# Patient Record
Sex: Female | Born: 1955
Health system: Southern US, Community
[De-identification: ages and names within clinical notes are randomized; demographics above are authoritative.]

## PROBLEM LIST (undated history)

## (undated) DIAGNOSIS — N183 Chronic kidney disease, stage 3 unspecified: Secondary | ICD-10-CM

## (undated) DIAGNOSIS — E1129 Type 2 diabetes mellitus with other diabetic kidney complication: Secondary | ICD-10-CM

## (undated) DIAGNOSIS — Z9889 Other specified postprocedural states: Secondary | ICD-10-CM

## (undated) DIAGNOSIS — I10 Essential (primary) hypertension: Secondary | ICD-10-CM

## (undated) DIAGNOSIS — Z940 Kidney transplant status: Secondary | ICD-10-CM

## (undated) DIAGNOSIS — E785 Hyperlipidemia, unspecified: Secondary | ICD-10-CM

## (undated) DIAGNOSIS — T8859XA Other complications of anesthesia, initial encounter: Secondary | ICD-10-CM

## (undated) DIAGNOSIS — R112 Nausea with vomiting, unspecified: Secondary | ICD-10-CM

## (undated) DIAGNOSIS — T4145XA Adverse effect of unspecified anesthetic, initial encounter: Secondary | ICD-10-CM

## (undated) HISTORY — PX: NEPHRECTOMY TRANSPLANTED ORGAN: SUR880

---

## 2015-10-07 DIAGNOSIS — Z940 Kidney transplant status: Secondary | ICD-10-CM

## 2015-10-07 HISTORY — DX: Kidney transplant status: Z94.0

## 2016-01-22 MED FILL — UNI-SOLVE ADHES REMOVER WIP: 90 days supply | Qty: 50 | Fill #0

## 2016-01-31 MED FILL — TACROLIMUS 1 MG CAPSULE: 1 | 30 days supply | Qty: 450 | Fill #0

## 2016-04-21 MED FILL — IV PREP ANTISEPTIC WIPES: 70 | 30 days supply | Qty: 50 | Fill #1

## 2016-09-24 MED FILL — UNI-SOLVE ADHES REMOVER WIP: 90 days supply | Qty: 50 | Fill #1

## 2017-10-03 ENCOUNTER — Emergency Department (HOSPITAL_COMMUNITY): Payer: Medicare Other

## 2017-10-03 ENCOUNTER — Inpatient Hospital Stay (HOSPITAL_COMMUNITY)
Admission: EM | Admit: 2017-10-03 | Discharge: 2017-10-05 | DRG: 637 | Disposition: A | Discharge status: Home / Self Care | Payer: Medicare Other | Attending: Internal Medicine | Admitting: Internal Medicine

## 2017-10-03 ENCOUNTER — Encounter (HOSPITAL_COMMUNITY): Payer: Self-pay | Admitting: Emergency Medicine

## 2017-10-03 DIAGNOSIS — W19XXXA Unspecified fall, initial encounter: Secondary | ICD-10-CM | POA: Diagnosis present

## 2017-10-03 DIAGNOSIS — I609 Nontraumatic subarachnoid hemorrhage, unspecified: Secondary | ICD-10-CM

## 2017-10-03 DIAGNOSIS — E1022 Type 1 diabetes mellitus with diabetic chronic kidney disease: Secondary | ICD-10-CM | POA: Diagnosis present

## 2017-10-03 DIAGNOSIS — E1129 Type 2 diabetes mellitus with other diabetic kidney complication: Secondary | ICD-10-CM

## 2017-10-03 DIAGNOSIS — R55 Syncope and collapse: Secondary | ICD-10-CM | POA: Diagnosis present

## 2017-10-03 DIAGNOSIS — N183 Chronic kidney disease, stage 3 unspecified: Secondary | ICD-10-CM | POA: Diagnosis present

## 2017-10-03 DIAGNOSIS — W1830XA Fall on same level, unspecified, initial encounter: Secondary | ICD-10-CM | POA: Diagnosis present

## 2017-10-03 DIAGNOSIS — S02401A Maxillary fracture, unspecified, initial encounter for closed fracture: Secondary | ICD-10-CM | POA: Diagnosis present

## 2017-10-03 DIAGNOSIS — Z9641 Presence of insulin pump (external) (internal): Secondary | ICD-10-CM | POA: Diagnosis present

## 2017-10-03 DIAGNOSIS — Z940 Kidney transplant status: Secondary | ICD-10-CM

## 2017-10-03 DIAGNOSIS — E162 Hypoglycemia, unspecified: Secondary | ICD-10-CM | POA: Diagnosis present

## 2017-10-03 DIAGNOSIS — E109 Type 1 diabetes mellitus without complications: Secondary | ICD-10-CM | POA: Diagnosis present

## 2017-10-03 DIAGNOSIS — I10 Essential (primary) hypertension: Secondary | ICD-10-CM | POA: Diagnosis present

## 2017-10-03 DIAGNOSIS — S0240DA Maxillary fracture, left side, initial encounter for closed fracture: Secondary | ICD-10-CM | POA: Diagnosis present

## 2017-10-03 DIAGNOSIS — I129 Hypertensive chronic kidney disease with stage 1 through stage 4 chronic kidney disease, or unspecified chronic kidney disease: Secondary | ICD-10-CM | POA: Diagnosis present

## 2017-10-03 DIAGNOSIS — E785 Hyperlipidemia, unspecified: Secondary | ICD-10-CM | POA: Diagnosis present

## 2017-10-03 DIAGNOSIS — Z794 Long term (current) use of insulin: Secondary | ICD-10-CM

## 2017-10-03 DIAGNOSIS — E10649 Type 1 diabetes mellitus with hypoglycemia without coma: Principal | ICD-10-CM | POA: Diagnosis present

## 2017-10-03 DIAGNOSIS — S0232XA Fracture of orbital floor, left side, initial encounter for closed fracture: Secondary | ICD-10-CM | POA: Diagnosis present

## 2017-10-03 DIAGNOSIS — S066X9A Traumatic subarachnoid hemorrhage with loss of consciousness of unspecified duration, initial encounter: Secondary | ICD-10-CM | POA: Diagnosis present

## 2017-10-03 HISTORY — DX: Chronic kidney disease, stage 3 unspecified: N18.30

## 2017-10-03 HISTORY — DX: Essential (primary) hypertension: I10

## 2017-10-03 HISTORY — DX: Type 2 diabetes mellitus with other diabetic kidney complication: E11.29

## 2017-10-03 HISTORY — DX: Hyperlipidemia, unspecified: E78.5

## 2017-10-03 HISTORY — DX: Chronic kidney disease, stage 3 (moderate): N18.3

## 2017-10-03 HISTORY — DX: Kidney transplant status: Z94.0

## 2017-10-03 LAB — CBC WITH DIFFERENTIAL/PLATELET
BASOS PCT: 0 %
Basophils Absolute: 0 10*3/uL (ref 0.0–0.1)
Eosinophils Absolute: 0 10*3/uL (ref 0.0–0.7)
Eosinophils Relative: 0 %
HEMATOCRIT: 43.3 % (ref 36.0–46.0)
HEMOGLOBIN: 14 g/dL (ref 12.0–15.0)
LYMPHS ABS: 1.1 10*3/uL (ref 0.7–4.0)
Lymphocytes Relative: 15 %
MCH: 31 pg (ref 26.0–34.0)
MCHC: 32.3 g/dL (ref 30.0–36.0)
MCV: 96 fL (ref 78.0–100.0)
MONOS PCT: 6 %
Monocytes Absolute: 0.4 10*3/uL (ref 0.1–1.0)
NEUTROS ABS: 5.8 10*3/uL (ref 1.7–7.7)
NEUTROS PCT: 79 %
Platelets: 175 10*3/uL (ref 150–400)
RBC: 4.51 MIL/uL (ref 3.87–5.11)
RDW: 14.6 % (ref 11.5–15.5)
WBC: 7.3 10*3/uL (ref 4.0–10.5)

## 2017-10-03 LAB — HEPATIC FUNCTION PANEL
ALT: 20 U/L (ref 14–54)
AST: 33 U/L (ref 15–41)
Albumin: 3.9 g/dL (ref 3.5–5.0)
Alkaline Phosphatase: 62 U/L (ref 38–126)
BILIRUBIN DIRECT: 0.2 mg/dL (ref 0.1–0.5)
BILIRUBIN INDIRECT: 0.5 mg/dL (ref 0.3–0.9)
TOTAL PROTEIN: 7.2 g/dL (ref 6.5–8.1)
Total Bilirubin: 0.7 mg/dL (ref 0.3–1.2)

## 2017-10-03 LAB — BASIC METABOLIC PANEL
ANION GAP: 10 (ref 5–15)
BUN: 45 mg/dL — ABNORMAL HIGH (ref 6–20)
CO2: 23 mmol/L (ref 22–32)
Calcium: 8.8 mg/dL — ABNORMAL LOW (ref 8.9–10.3)
Chloride: 106 mmol/L (ref 101–111)
Creatinine, Ser: 1.7 mg/dL — ABNORMAL HIGH (ref 0.44–1.00)
GFR calc Af Amer: 36 mL/min — ABNORMAL LOW (ref >60)
GFR calc non Af Amer: 31 mL/min — ABNORMAL LOW (ref >60)
GLUCOSE: 102 mg/dL — AB (ref 65–99)
POTASSIUM: 3.5 mmol/L (ref 3.5–5.1)
Sodium: 139 mmol/L (ref 135–145)

## 2017-10-03 LAB — ETHANOL: Alcohol, Ethyl (B): <10 mg/dL (ref <10)

## 2017-10-03 LAB — CBG MONITORING, ED
Glucose-Capillary: 97 mg/dL (ref 65–99)
Glucose-Capillary: 81 mg/dL (ref 65–99)

## 2017-10-03 LAB — I-STAT TROPONIN, ED: Troponin i, poc: 0 ng/mL (ref 0.00–0.08)

## 2017-10-03 LAB — PROTIME-INR
INR: 1.02
PROTHROMBIN TIME: 13.3 s (ref 11.4–15.2)

## 2017-10-03 MED ORDER — SODIUM CHLORIDE 0.9 % IV SOLN
500.0000 mg | Freq: Two times a day (BID) | INTRAVENOUS | Status: DC
  Administered 2017-10-03 – 2017-10-04 (×3): 500 mg via INTRAVENOUS
  Filled 2017-10-03 (×5): qty 5

## 2017-10-03 NOTE — Consult Note (Signed)
Chief Complaint   Chief Complaint  Patient presents with  . Fall    HPI   HPI: Hannah Valentine is a 61 y.o. female who presented to ER after witnessed fall. She was out feeding deer when she became hypoglycemic and fell striking left side of face. +LOC. No witnessed seizure like activity. GBC 37 by report upon EMS arrival.. She reports she is "immune" to symptoms of hypoglycemia and will occasionally become hypoglycemic without warning. She currently feels well with left sided facial pain. Mild is severity. Denies nausea, vomiting, dizziness, photophobia, motor/sensory deficits. 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 No history CVA/MI.  There are no active problems to display for this patient.   PMH: Past Medical History:  Diagnosis Date  . Kidney transplanted 2017    PSH: Past Surgical History:  Procedure Laterality Date  . NEPHRECTOMY TRANSPLANTED ORGAN       (Not in a hospital admission)  SH: Social History   Tobacco Use  . Smoking status: Never Smoker  . Smokeless tobacco: Never Used  Substance Use Topics  . Alcohol use: No    Frequency: Never  . Drug use: No    MEDS: Prior to Admission medications   Not on File    ALLERGY: Not on File  Social History   Tobacco Use  . Smoking status: Never Smoker  . Smokeless tobacco: Never Used  Substance Use Topics  . Alcohol use: No    Frequency: Never     No family history on file.   ROS   Review of Systems  Constitutional: Negative.   HENT: Negative.   Eyes: Negative.   Respiratory: Negative.   Cardiovascular: Negative.   Gastrointestinal: Negative.   Genitourinary: Negative.   Musculoskeletal: Positive for falls. Negative for back pain, joint pain, myalgias and neck pain.  Skin: Negative.   Neurological: Positive for loss of consciousness and headaches (left sided facial pain). Negative for dizziness, tingling, tremors, sensory change, speech change, focal weakness  and seizures.    Exam   Vitals:   10/03/17 2100 10/03/17 2130  BP: (!) 163/53 (!) 163/60  Pulse: (!) 56 (!) 59  Resp: (!) 22 13  SpO2: 98% 98%   General appearance: WDWN, resting comfortably, NAD, bruising and swelling left orbit Eyes: PERRL, Fundoscopic: normal Cardiovascular: Regular rate and rhythm without murmurs, rubs, gallops. No edema or variciosities. Distal pulses normal. Pulmonary: Clear to auscultation Musculoskeletal:     Muscle tone upper extremities: Normal    Muscle tone lower extremities: Normal    Motor exam: Upper Extremities Deltoid Bicep Tricep Grip  Right 5/5 5/5 4-/5 (chronic due to fistula per patient) 5/5  Left 5/5 5/5 5/5 5/5   Lower Extremity IP Quad PF DF EHL  Right 5/5 5/5 5/5 5/5 5/5  Left 5/5 5/5 5/5 5/5 5/5   Neurological Awake, alert, oriented Memory and concentration grossly intact Speech fluent, appropriate CNII: Visual fields normal CNIII/IV/VI: EOMI CNV: Facial sensation normal CNVII: Symmetric, normal strength CNVIII: Grossly normal CNIX: Normal palate movement CNXI: Trap and SCM strength normal CN XII: Tongue protrusion normal Sensation grossly intact to LT DTR: Normal Coordination (finger/nose & heel/shin): Normal  Results - Imaging/Labs   Results for orders placed or performed during the hospital encounter of 10/03/17 (from the past 48 hour(s))  Basic metabolic panel     Status: Abnormal   Collection Time: 10/03/17  7:50 PM  Result Value Ref Range   Sodium 139 135 - 145 mmol/L  Potassium 3.5 3.5 - 5.1 mmol/L   Chloride 106 101 - 111 mmol/L   CO2 23 22 - 32 mmol/L   Glucose, Bld 102 (H) 65 - 99 mg/dL   BUN 45 (H) 6 - 20 mg/dL   Creatinine, Ser 1.70 (H) 0.44 - 1.00 mg/dL   Calcium 8.8 (L) 8.9 - 10.3 mg/dL   GFR calc non Af Amer 31 (L) >60 mL/min   GFR calc Af Amer 36 (L) >60 mL/min    Comment: (NOTE) The eGFR has been calculated using the CKD EPI equation. This calculation has not been validated in all clinical  situations. eGFR's persistently <60 mL/min signify possible Chronic Kidney Disease.    Anion gap 10 5 - 15  Ethanol     Status: None   Collection Time: 10/03/17  7:50 PM  Result Value Ref Range   Alcohol, Ethyl (B) <10 <10 mg/dL    Comment:        LOWEST DETECTABLE LIMIT FOR SERUM ALCOHOL IS 10 mg/dL FOR MEDICAL PURPOSES ONLY   CBC with Differential     Status: None   Collection Time: 10/03/17  7:50 PM  Result Value Ref Range   WBC 7.3 4.0 - 10.5 K/uL   RBC 4.51 3.87 - 5.11 MIL/uL   Hemoglobin 14.0 12.0 - 15.0 g/dL   HCT 43.3 36.0 - 46.0 %   MCV 96.0 78.0 - 100.0 fL   MCH 31.0 26.0 - 34.0 pg   MCHC 32.3 30.0 - 36.0 g/dL   RDW 14.6 11.5 - 15.5 %   Platelets 175 150 - 400 K/uL   Neutrophils Relative % 79 %   Neutro Abs 5.8 1.7 - 7.7 K/uL   Lymphocytes Relative 15 %   Lymphs Abs 1.1 0.7 - 4.0 K/uL   Monocytes Relative 6 %   Monocytes Absolute 0.4 0.1 - 1.0 K/uL   Eosinophils Relative 0 %   Eosinophils Absolute 0.0 0.0 - 0.7 K/uL   Basophils Relative 0 %   Basophils Absolute 0.0 0.0 - 0.1 K/uL  Protime-INR     Status: None   Collection Time: 10/03/17  7:50 PM  Result Value Ref Range   Prothrombin Time 13.3 11.4 - 15.2 seconds   INR 1.02   Hepatic function panel     Status: None   Collection Time: 10/03/17  7:50 PM  Result Value Ref Range   Total Protein 7.2 6.5 - 8.1 g/dL   Albumin 3.9 3.5 - 5.0 g/dL   AST 33 15 - 41 U/L   ALT 20 14 - 54 U/L   Alkaline Phosphatase 62 38 - 126 U/L   Total Bilirubin 0.7 0.3 - 1.2 mg/dL   Bilirubin, Direct 0.2 0.1 - 0.5 mg/dL   Indirect Bilirubin 0.5 0.3 - 0.9 mg/dL  I-stat troponin, ED     Status: None   Collection Time: 10/03/17  7:52 PM  Result Value Ref Range   Troponin i, poc 0.00 0.00 - 0.08 ng/mL   Comment 3            Comment: Due to the release kinetics of cTnI, a negative result within the first hours of the onset of symptoms does not rule out myocardial infarction with certainty. If myocardial infarction is still  suspected, repeat the test at appropriate intervals.   POC CBG, ED     Status: None   Collection Time: 10/03/17  7:58 PM  Result Value Ref Range   Glucose-Capillary 97 65 - 99 mg/dL  Dg Chest 2 View  Result Date: 10/03/2017 CLINICAL DATA:  Recent syncopal episode EXAM: CHEST  2 VIEW COMPARISON:  03/14/2015 FINDINGS: Cardiac shadow is enlarged. Aortic calcifications are again seen. The lungs are well aerated bilaterally without focal infiltrate or sizable effusion. No acute bony abnormality is seen. IMPRESSION: No active cardiopulmonary disease. Electronically Signed   By: Inez Catalina M.D.   On: 10/03/2017 21:20   Ct Head Wo Contrast  Result Date: 10/03/2017 CLINICAL DATA:  Facial trauma EXAM: CT HEAD WITHOUT CONTRAST CT MAXILLOFACIAL WITHOUT CONTRAST TECHNIQUE: Multidetector CT imaging of the head and maxillofacial structures were performed using the standard protocol without intravenous contrast. Multiplanar CT image reconstructions of the maxillofacial structures were also generated. COMPARISON:  None. FINDINGS: CT HEAD FINDINGS Brain: Ventricle size normal.  Negative for acute infarct or mass Small amount of subarachnoid hemorrhage left parietal region. No subdural hematoma or midline shift. Vascular: Negative for hyperdense vessel Skull: Left facial fractures described below. Negative for skull fracture. Other: None CT MAXILLOFACIAL FINDINGS Osseous: Fracture of the left maxillary sinus involving the anterior and lateral walls with mild displacement. There is blood in the left maxillary sinus. Mildly displaced fracture of the left orbital floor. Zygomatic arch intact. No other facial fractures. Moderate to advanced degenerative change right TMJ. Orbits: Mildly displaced fracture left orbital floor with bone fragment projecting into the left orbit. Sinuses: Blood in the left maxillary sinus.  Remaining sinuses clear Soft tissues: Soft tissue hematoma lateral to the left orbit IMPRESSION: 1.  Small volume subarachnoid hemorrhage left parietal region 2. Fracture of the left maxillary sinus and left orbital floor. Small bone fragment projects into the left orbit. These results were called by telephone at the time of interpretation on 10/03/2017 at 8:58 pm to Dr. Alferd Apa , who verbally acknowledged these results. Electronically Signed   By: Franchot Gallo M.D.   On: 10/03/2017 20:58   Ct Maxillofacial Wo Cm  Result Date: 10/03/2017 CLINICAL DATA:  Facial trauma EXAM: CT HEAD WITHOUT CONTRAST CT MAXILLOFACIAL WITHOUT CONTRAST TECHNIQUE: Multidetector CT imaging of the head and maxillofacial structures were performed using the standard protocol without intravenous contrast. Multiplanar CT image reconstructions of the maxillofacial structures were also generated. COMPARISON:  None. FINDINGS: CT HEAD FINDINGS Brain: Ventricle size normal.  Negative for acute infarct or mass Small amount of subarachnoid hemorrhage left parietal region. No subdural hematoma or midline shift. Vascular: Negative for hyperdense vessel Skull: Left facial fractures described below. Negative for skull fracture. Other: None CT MAXILLOFACIAL FINDINGS Osseous: Fracture of the left maxillary sinus involving the anterior and lateral walls with mild displacement. There is blood in the left maxillary sinus. Mildly displaced fracture of the left orbital floor. Zygomatic arch intact. No other facial fractures. Moderate to advanced degenerative change right TMJ. Orbits: Mildly displaced fracture left orbital floor with bone fragment projecting into the left orbit. Sinuses: Blood in the left maxillary sinus.  Remaining sinuses clear Soft tissues: Soft tissue hematoma lateral to the left orbit IMPRESSION: 1. Small volume subarachnoid hemorrhage left parietal region 2. Fracture of the left maxillary sinus and left orbital floor. Small bone fragment projects into the left orbit. These results were called by telephone at the time of  interpretation on 10/03/2017 at 8:58 pm to Dr. Alferd Apa , who verbally acknowledged these results. Electronically Signed   By: Franchot Gallo M.D.   On: 10/03/2017 20:58    Impression/Plan   61 y.o. female with small amount of SAH left partal region.  No mass effect/midline shift. No evidence of hydrocephalus. She is neurologically intact. No acute neurosurgical intervention. Should resolve with time. Patient has other injuries including left orbital floor fracture and left maxillary sinus fracture - defer to specialists respectively.  Patient to be admitted under trauma.   - Admit for obs of [place]   - Monitor neuro q 1-2 hours, report changes   - Repeat haed CT tomorrow am, sooner as indicated by exam   - Keppra 587m BID x7 days for seizure prophylaxis  Will follow from [facility]  Please call for any questions.

## 2017-10-03 NOTE — ED Notes (Signed)
Re-paged Trauma

## 2017-10-03 NOTE — ED Triage Notes (Signed)
Per EMS. Pt fell today due to low glucose. Pt CBG on scene was 37. Given 1 of glucagon and 5 g of dextrose. CBG is now 124. Pt fell face first and hit her head on concrete. Pt denies LOC but was confused after fall. Pt denies neck or back pain.

## 2017-10-03 NOTE — H&P (Signed)
Activation and Reason: Trauma consult - Ferne Reus  Primary Survey:  Airway: Intact Breathing: Spontaneous Circulation: Palpable pulses in all 4 ext Disability: GCS 15  Hannah Valentine is an 61 y.o. female.  HPI: 18F presented to ED after witnessed fall around 5:30pm tonight. She was out feeding deer when she became hypoglycemic and fell striking left side of face. +LOC. No witnessed seizure like activity. GBC 37 by report upon EMS arrival.. She reports she is "immune" to symptoms of hypoglycemia and will occasionally become hypoglycemic without warning. She currently feels well with left sided facial pain. Mild is severity. Denies nausea, vomiting, dizziness, photophobia, motor/sensory deficits. Does have chronic LUE weakness secondary to AVF for dialysis which she no longer needs 2/2 new kidney transplant 2 yrs ago.Takes daily 69m ASA. No history CVA/MI.  Past Medical History:  Diagnosis Date  . Kidney transplanted 2017    Past Surgical History:  Procedure Laterality Date  . NEPHRECTOMY TRANSPLANTED ORGAN      FHx: Denies FHx of malignancy  Social History:  reports that  has never smoked. she has never used smokeless tobacco. She reports that she does not drink alcohol or use drugs.  Allergies:  Allergies  Allergen Reactions  . Atorvastatin Other (See Comments)    Muscle weakness  . Hydrocodone-Acetaminophen Nausea Only  . Labetalol Other (See Comments)    "makes her feel bad"  . Meperidine Nausea Only  . Pregabalin Other (See Comments)    dizzy    Medications: I have reviewed the patient's current medications.  Results for orders placed or performed during the hospital encounter of 10/03/17 (from the past 48 hour(s))  Basic metabolic panel     Status: Abnormal   Collection Time: 10/03/17  7:50 PM  Result Value Ref Range   Sodium 139 135 - 145 mmol/L   Potassium 3.5 3.5 - 5.1 mmol/L   Chloride 106 101 - 111 mmol/L   CO2 23 22 - 32 mmol/L   Glucose, Bld 102  (H) 65 - 99 mg/dL   BUN 45 (H) 6 - 20 mg/dL   Creatinine, Ser 1.70 (H) 0.44 - 1.00 mg/dL   Calcium 8.8 (L) 8.9 - 10.3 mg/dL   GFR calc non Af Amer 31 (L) >60 mL/min   GFR calc Af Amer 36 (L) >60 mL/min    Comment: (NOTE) The eGFR has been calculated using the CKD EPI equation. This calculation has not been validated in all clinical situations. eGFR's persistently <60 mL/min signify possible Chronic Kidney Disease.    Anion gap 10 5 - 15  Ethanol     Status: None   Collection Time: 10/03/17  7:50 PM  Result Value Ref Range   Alcohol, Ethyl (B) <10 <10 mg/dL    Comment:        LOWEST DETECTABLE LIMIT FOR SERUM ALCOHOL IS 10 mg/dL FOR MEDICAL PURPOSES ONLY   CBC with Differential     Status: None   Collection Time: 10/03/17  7:50 PM  Result Value Ref Range   WBC 7.3 4.0 - 10.5 K/uL   RBC 4.51 3.87 - 5.11 MIL/uL   Hemoglobin 14.0 12.0 - 15.0 g/dL   HCT 43.3 36.0 - 46.0 %   MCV 96.0 78.0 - 100.0 fL   MCH 31.0 26.0 - 34.0 pg   MCHC 32.3 30.0 - 36.0 g/dL   RDW 14.6 11.5 - 15.5 %   Platelets 175 150 - 400 K/uL   Neutrophils Relative % 79 %   Neutro  Abs 5.8 1.7 - 7.7 K/uL   Lymphocytes Relative 15 %   Lymphs Abs 1.1 0.7 - 4.0 K/uL   Monocytes Relative 6 %   Monocytes Absolute 0.4 0.1 - 1.0 K/uL   Eosinophils Relative 0 %   Eosinophils Absolute 0.0 0.0 - 0.7 K/uL   Basophils Relative 0 %   Basophils Absolute 0.0 0.0 - 0.1 K/uL  Protime-INR     Status: None   Collection Time: 10/03/17  7:50 PM  Result Value Ref Range   Prothrombin Time 13.3 11.4 - 15.2 seconds   INR 1.02   Hepatic function panel     Status: None   Collection Time: 10/03/17  7:50 PM  Result Value Ref Range   Total Protein 7.2 6.5 - 8.1 g/dL   Albumin 3.9 3.5 - 5.0 g/dL   AST 33 15 - 41 U/L   ALT 20 14 - 54 U/L   Alkaline Phosphatase 62 38 - 126 U/L   Total Bilirubin 0.7 0.3 - 1.2 mg/dL   Bilirubin, Direct 0.2 0.1 - 0.5 mg/dL   Indirect Bilirubin 0.5 0.3 - 0.9 mg/dL  I-stat troponin, ED     Status: None    Collection Time: 10/03/17  7:52 PM  Result Value Ref Range   Troponin i, poc 0.00 0.00 - 0.08 ng/mL   Comment 3            Comment: Due to the release kinetics of cTnI, a negative result within the first hours of the onset of symptoms does not rule out myocardial infarction with certainty. If myocardial infarction is still suspected, repeat the test at appropriate intervals.   POC CBG, ED     Status: None   Collection Time: 10/03/17  7:58 PM  Result Value Ref Range   Glucose-Capillary 97 65 - 99 mg/dL  CBG monitoring, ED     Status: None   Collection Time: 10/03/17 10:07 PM  Result Value Ref Range   Glucose-Capillary 81 65 - 99 mg/dL    Dg Chest 2 View  Result Date: 10/03/2017 CLINICAL DATA:  Recent syncopal episode EXAM: CHEST  2 VIEW COMPARISON:  03/14/2015 FINDINGS: Cardiac shadow is enlarged. Aortic calcifications are again seen. The lungs are well aerated bilaterally without focal infiltrate or sizable effusion. No acute bony abnormality is seen. IMPRESSION: No active cardiopulmonary disease. Electronically Signed   By: Inez Catalina M.D.   On: 10/03/2017 21:20   Ct Head Wo Contrast  Result Date: 10/03/2017 CLINICAL DATA:  Facial trauma EXAM: CT HEAD WITHOUT CONTRAST CT MAXILLOFACIAL WITHOUT CONTRAST TECHNIQUE: Multidetector CT imaging of the head and maxillofacial structures were performed using the standard protocol without intravenous contrast. Multiplanar CT image reconstructions of the maxillofacial structures were also generated. COMPARISON:  None. FINDINGS: CT HEAD FINDINGS Brain: Ventricle size normal.  Negative for acute infarct or mass Small amount of subarachnoid hemorrhage left parietal region. No subdural hematoma or midline shift. Vascular: Negative for hyperdense vessel Skull: Left facial fractures described below. Negative for skull fracture. Other: None CT MAXILLOFACIAL FINDINGS Osseous: Fracture of the left maxillary sinus involving the anterior and lateral walls  with mild displacement. There is blood in the left maxillary sinus. Mildly displaced fracture of the left orbital floor. Zygomatic arch intact. No other facial fractures. Moderate to advanced degenerative change right TMJ. Orbits: Mildly displaced fracture left orbital floor with bone fragment projecting into the left orbit. Sinuses: Blood in the left maxillary sinus.  Remaining sinuses clear Soft tissues: Soft tissue hematoma  lateral to the left orbit IMPRESSION: 1. Small volume subarachnoid hemorrhage left parietal region 2. Fracture of the left maxillary sinus and left orbital floor. Small bone fragment projects into the left orbit. These results were called by telephone at the time of interpretation on 10/03/2017 at 8:58 pm to Dr. Alferd Apa , who verbally acknowledged these results. Electronically Signed   By: Franchot Gallo M.D.   On: 10/03/2017 20:58   Ct Maxillofacial Wo Cm  Result Date: 10/03/2017 CLINICAL DATA:  Facial trauma EXAM: CT HEAD WITHOUT CONTRAST CT MAXILLOFACIAL WITHOUT CONTRAST TECHNIQUE: Multidetector CT imaging of the head and maxillofacial structures were performed using the standard protocol without intravenous contrast. Multiplanar CT image reconstructions of the maxillofacial structures were also generated. COMPARISON:  None. FINDINGS: CT HEAD FINDINGS Brain: Ventricle size normal.  Negative for acute infarct or mass Small amount of subarachnoid hemorrhage left parietal region. No subdural hematoma or midline shift. Vascular: Negative for hyperdense vessel Skull: Left facial fractures described below. Negative for skull fracture. Other: None CT MAXILLOFACIAL FINDINGS Osseous: Fracture of the left maxillary sinus involving the anterior and lateral walls with mild displacement. There is blood in the left maxillary sinus. Mildly displaced fracture of the left orbital floor. Zygomatic arch intact. No other facial fractures. Moderate to advanced degenerative change right TMJ. Orbits:  Mildly displaced fracture left orbital floor with bone fragment projecting into the left orbit. Sinuses: Blood in the left maxillary sinus.  Remaining sinuses clear Soft tissues: Soft tissue hematoma lateral to the left orbit IMPRESSION: 1. Small volume subarachnoid hemorrhage left parietal region 2. Fracture of the left maxillary sinus and left orbital floor. Small bone fragment projects into the left orbit. These results were called by telephone at the time of interpretation on 10/03/2017 at 8:58 pm to Dr. Alferd Apa , who verbally acknowledged these results. Electronically Signed   By: Franchot Gallo M.D.   On: 10/03/2017 20:58    Review of Systems  Constitutional: Negative for chills and fever.  HENT: Positive for sinus pain. Negative for ear pain and hearing loss.   Eyes: Negative for blurred vision and double vision.  Respiratory: Negative for cough and shortness of breath.   Cardiovascular: Negative for chest pain and palpitations.  Gastrointestinal: Negative for abdominal pain, nausea and vomiting.  Genitourinary: Negative for flank pain and hematuria.  Musculoskeletal: Positive for falls. Negative for back pain, joint pain and neck pain.  Skin: Negative for itching and rash.  Neurological: Positive for loss of consciousness. Negative for dizziness, sensory change, focal weakness, seizures and headaches.  Endo/Heme/Allergies: Negative for environmental allergies. Does not bruise/bleed easily.  Psychiatric/Behavioral: Negative for depression and suicidal ideas.   Blood pressure 137/61, pulse 71, resp. rate 15, height 5' 5"  (1.651 m), weight 68 kg (150 lb), SpO2 98 %. Physical Exam  Constitutional: She is oriented to person, place, and time. She appears well-nourished.  HENT:  Head: Normocephalic.  Right Ear: External ear normal.  Left Ear: External ear normal.  Nose: Nose normal.  Mouth/Throat: Oropharynx is clear and moist.  Eyes: EOM are normal. Pupils are equal, round, and  reactive to light.  Left periorbital ecchmyoses  Neck: Normal range of motion. Neck supple.  Cardiovascular: Normal rate and regular rhythm.  Respiratory: Effort normal and breath sounds normal.  GI: Soft. There is no tenderness. There is no rebound and no guarding.  Musculoskeletal: She exhibits tenderness.  Left hand with pain to palpation; ecchymosis around 5th finger  Neurological: She is alert and  oriented to person, place, and time.  Skin: Skin is warm and dry.     Left cheek abrasions  Psychiatric: She has a normal mood and affect. Her behavior is normal. Judgment and thought content normal.    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 -ENT Consulted (Dr. Benjamine Mola) -Ophtho Consulted (Dr. Posey Pronto)  Sharon Mt. Dema Severin, M.D. [facility]. 10/03/2017, 11:58 PM

## 2017-10-03 NOTE — ED Provider Notes (Signed)
[facility]   CSN: [id number] Arrival date & time: 10/03/17  1912     History   Chief Complaint Chief Complaint  Patient presents with  . Fall    HPI Hannah Valentine is a 61 y.o. female with a past medical history of kidney transplantation, type 1 diabetes on insulin pump, hypertension, hyperlipidemia who presents the emergency department today for fall.  Patient is present with her husband who helps provide history.  At approximately 5:30 PM today the patient was out on her driveway going to look at the deer in her yard when she had a syncopal episode. She denies any pre-syncopal symptoms.  Her husband says that she fell face first and hit her head on the concrete.  They report that she was unconscious for approximately 5 minutes and upon awakening was confused for ~10 minues.  The husband suspect this was due to low blood sugar as she has had similar events in the past.  He took a CBG that showed that her blood sugar was 37.  He administered 1 unit of glucagon and gave 5 g of dextrose.  After administration, CBG 123 by EMS.  Patient is noted to have significant swelling and trauma to the left orbit as well as abrasion to the left side of her face.  She is complaining of assoicated pain to this area.   She is without any neck pain, back pain, headache, focal weakness, chest pain, shortness breath, palpitations, melena, nausea, vomiting, diaphoresis, difficulty with speech, facial droop, change in gait, and denies alcohol/drug use.  She denies any new medications. No recent illnesses. Patient is not on any blood thinners. Tetanus up to date. No seizure like activity witnessed.   HPI  Past Medical History:  Diagnosis Date  . Kidney transplanted 2017    There are no active problems to display for this patient.   Past Surgical History:  Procedure Laterality Date  . NEPHRECTOMY TRANSPLANTED ORGAN      OB History    No data available         Home Medications    Prior to Admission medications   Not on File    Family History No family history on file.  Social History Social History   Tobacco Use  . Smoking status: Never Smoker  . Smokeless tobacco: Never Used  Substance Use Topics  . Alcohol use: No    Frequency: Never  . Drug use: No     Allergies   Patient has no allergy information on record.   Review of Systems Review of Systems  All other systems reviewed and are negative.    Physical Exam Updated Vital Signs BP (!) 169/65   Pulse 63   Resp 18   Ht 5\' 5"  (1.651 m)   Wt 68 kg (150 lb)   SpO2 99%   BMI 24.96 kg/m   Physical Exam  Constitutional: She appears well-developed and well-nourished.  HENT:  Head: Normocephalic.  Right Ear: Tympanic membrane and external ear normal. No hemotympanum.  Left Ear: Tympanic membrane and external ear normal. No hemotympanum.  Nose: No nasal septal hematoma. Epistaxis (dried, right) is observed.  Mouth/Throat: Uvula is midline, oropharynx is clear and moist and mucous membranes are normal. No tonsillar exudate.  Significant pain, bruising and swelling to the left orbital bones, with abrasion to the left temporal region of the face (see picture).  No palpable open or depressed skull fractures.  Eyes: Conjunctivae and EOM  are normal. Pupils are equal, round, and reactive to light. Right eye exhibits no discharge. Left eye exhibits no discharge. No scleral icterus.  No entrapment with extraocular movements  Neck: Trachea normal and normal range of motion. Neck supple. No JVD present. No spinous process tenderness present. No neck rigidity. Normal range of motion present.  No carotid bruit.  Cardiovascular: Normal rate, regular rhythm and intact distal pulses.  Pulses:      Radial pulses are 2+ on the right side, and 2+ on the left side.       Dorsalis pedis pulses are 2+ on the right side, and 2+ on the left side.       Posterior tibial pulses are 2+  on the right side, and 2+ on the left side.  No lower extremity swelling or edema. Calves symmetric in size bilaterally.  Good thrill (left arm).   Pulmonary/Chest: Effort normal and breath sounds normal. She exhibits no tenderness.  Abdominal: Soft. Bowel sounds are normal. There is no tenderness. There is no rebound and no guarding.  Musculoskeletal: She exhibits no edema.  No cervical, thoracic or lumbar spinous tenderness palpation or step-offs.  Negative logroll test bilaterally.  No sacral crepitus.  Passively moves all major joints without pain or difficulty.    Lymphadenopathy:    She has no cervical adenopathy.  Neurological: She is alert.  Mental Status:  Alert, oriented, thought content appropriate, able to give a coherent history. Speech fluent without evidence of aphasia. Able to follow 2 step commands without difficulty.  Cranial Nerves:  II:  Peripheral visual fields grossly normal, pupils equal, round, reactive to light III,IV, VI: ptosis not present, extra-ocular motions intact bilaterally  V,VII: smile symmetric, eyebrows raise symmetric, facial light touch sensation equal VIII: hearing grossly normal to voice  X: uvula elevates symmetrically  XI: bilateral shoulder shrug symmetric and strong XII: midline tongue extension without fassiculations Motor:  Normal tone. 5/5 in upper and lower extremities bilaterally including strong and equal grip strength and dorsiflexion/plantar flexion Sensory: Sensation intact to light touch in all extremities. Negative Romberg.  Deep Tendon Reflexes: 2+ and symmetric in the biceps and patella Cerebellar: normal finger-to-nose with bilateral upper extremities. Normal heel-to -shin balance bilaterally of the lower extremity. No pronator drift.  Gait: normal gait and balance CV: distal pulses palpable throughout   Skin: Skin is warm and dry. No rash noted. She is not diaphoretic.  There is mild abrasions and bruising to the left hand.    Psychiatric: She has a normal mood and affect.  Nursing note and vitals reviewed.      ED Treatments / Results  Labs (all labs ordered are listed, but only abnormal results are displayed) Labs Reviewed  BASIC METABOLIC PANEL - Abnormal; Notable for the following components:      Result Value   Glucose, Bld 102 (*)    BUN 45 (*)    Creatinine, Ser 1.70 (*)    Calcium 8.8 (*)    GFR calc non Af Amer 31 (*)    GFR calc Af Amer 36 (*)    All other components within normal limits  ETHANOL  CBC WITH DIFFERENTIAL/PLATELET  PROTIME-INR  HEPATIC FUNCTION PANEL  URINALYSIS, ROUTINE W REFLEX MICROSCOPIC  CBG MONITORING, ED  I-STAT TROPONIN, ED  CBG MONITORING, ED  CBG MONITORING, ED    EKG  EKG Interpretation  Date/Time:  Saturday October 03 2017 19:34:10 EST Ventricular Rate:  62 PR Interval:    QRS Duration: 102  QT Interval:  464 QTC Calculation: 472 R Axis:   57 Text Interpretation:  Sinus rhythm Left atrial enlargement No old tracing to compare Confirmed by Rolan Bucco [id number]) on 10/03/2017 11:45:46 PM       Radiology Dg Chest 2 View  Result Date: 10/03/2017 CLINICAL DATA:  Recent syncopal episode EXAM: CHEST  2 VIEW COMPARISON:  03/14/2015 FINDINGS: Cardiac shadow is enlarged. Aortic calcifications are again seen. The lungs are well aerated bilaterally without focal infiltrate or sizable effusion. No acute bony abnormality is seen. IMPRESSION: No active cardiopulmonary disease. Electronically Signed   By: Alcide Clever M.D.   On: 10/03/2017 21:20   Ct Head Wo Contrast  Result Date: 10/03/2017 CLINICAL DATA:  Facial trauma EXAM: CT HEAD WITHOUT CONTRAST CT MAXILLOFACIAL WITHOUT CONTRAST TECHNIQUE: Multidetector CT imaging of the head and maxillofacial structures were performed using the standard protocol without intravenous contrast. Multiplanar CT image reconstructions of the maxillofacial structures were also generated. COMPARISON:  None. FINDINGS: CT HEAD  FINDINGS Brain: Ventricle size normal.  Negative for acute infarct or mass Small amount of subarachnoid hemorrhage left parietal region. No subdural hematoma or midline shift. Vascular: Negative for hyperdense vessel Skull: Left facial fractures described below. Negative for skull fracture. Other: None CT MAXILLOFACIAL FINDINGS Osseous: Fracture of the left maxillary sinus involving the anterior and lateral walls with mild displacement. There is blood in the left maxillary sinus. Mildly displaced fracture of the left orbital floor. Zygomatic arch intact. No other facial fractures. Moderate to advanced degenerative change right TMJ. Orbits: Mildly displaced fracture left orbital floor with bone fragment projecting into the left orbit. Sinuses: Blood in the left maxillary sinus.  Remaining sinuses clear Soft tissues: Soft tissue hematoma lateral to the left orbit IMPRESSION: 1. Small volume subarachnoid hemorrhage left parietal region 2. Fracture of the left maxillary sinus and left orbital floor. Small bone fragment projects into the left orbit. These results were called by telephone at the time of interpretation on 10/03/2017 at 8:58 pm to Dr. Leary Roca , who verbally acknowledged these results. Electronically Signed   By: Marlan Palau M.D.   On: 10/03/2017 20:58   Ct Maxillofacial Wo Cm  Result Date: 10/03/2017 CLINICAL DATA:  Facial trauma EXAM: CT HEAD WITHOUT CONTRAST CT MAXILLOFACIAL WITHOUT CONTRAST TECHNIQUE: Multidetector CT imaging of the head and maxillofacial structures were performed using the standard protocol without intravenous contrast. Multiplanar CT image reconstructions of the maxillofacial structures were also generated. COMPARISON:  None. FINDINGS: CT HEAD FINDINGS Brain: Ventricle size normal.  Negative for acute infarct or mass Small amount of subarachnoid hemorrhage left parietal region. No subdural hematoma or midline shift. Vascular: Negative for hyperdense vessel Skull: Left  facial fractures described below. Negative for skull fracture. Other: None CT MAXILLOFACIAL FINDINGS Osseous: Fracture of the left maxillary sinus involving the anterior and lateral walls with mild displacement. There is blood in the left maxillary sinus. Mildly displaced fracture of the left orbital floor. Zygomatic arch intact. No other facial fractures. Moderate to advanced degenerative change right TMJ. Orbits: Mildly displaced fracture left orbital floor with bone fragment projecting into the left orbit. Sinuses: Blood in the left maxillary sinus.  Remaining sinuses clear Soft tissues: Soft tissue hematoma lateral to the left orbit IMPRESSION: 1. Small volume subarachnoid hemorrhage left parietal region 2. Fracture of the left maxillary sinus and left orbital floor. Small bone fragment projects into the left orbit. These results were called by telephone at the time of interpretation on 10/03/2017 at 8:58 pm  to Dr. Leary Roca , who verbally acknowledged these results. Electronically Signed   By: Marlan Palau M.D.   On: 10/03/2017 20:58    Procedures Procedures (including critical care time)  Medications Ordered in ED Medications  levETIRAcetam (KEPPRA) 500 mg in sodium chloride 0.9 % 100 mL IVPB (0 mg Intravenous Stopped 10/03/17 2328)     Initial Impression / Assessment and Plan / ED Course  I have reviewed the triage vital signs and the nursing notes.  Pertinent labs & imaging results that were available during my care of the patient were reviewed by me and considered in my medical decision making (see chart for details).     61 year old type I diabetic patient on insulin pump presenting for syncopal episode at approximately 5:30 PM today.  There is no presyncopal symptoms.  Patient did have head trauma, falling face first onto concrete.  She was unconscious for approximately 5 minutes and had confusion for approximately 10 minutes after the event.  CBG was checked at the time of the  event and was noted to be 37.  This improved after 1 unit of glucagon and 5 g of dextrose.  On presentation the patient's vital signs are reassuring.  Initial CBG 97.  On exam the patient is neurologically intact.  She is without any focal neurologic deficits.  She is noted to have significant swelling and bruising to the left orbit as described and shown in the picture above.  There is no evidence of visual changes,  Or visual loss.  Patient is with intact extra ocular movements and without evidence of entrapment.  There is also noted to be abrasions to the left temporal region and left hand.  No tenderness palpation of the neck.  CT scan of head ordered due to syncopal episode and loss of consciousness.  CT maxillofacial ordered due to facial trauma.  Labs, EKG, chest x-ray, UA ordered to rule out causes of hypoglycemia.  Patient states that her last meal was at 1:30 PM.  CBC will be monitored and patient's insulin pump will be turned off at this time.  CT scan results called in. Patient with a small volume subarachnoid hemorrhage to the left parietal region.  She is currently neurologically intact. Consult to neurosurgery placed. PA, Vincet Costella of neurosurgery to see the patient. He advised patient will be started on keppra. They will follow and repeat Ct scan in the morning.   CT scan of maxillofacial also shows there is a fracture of the left maxillary sinus and left orbital floor.  There is a small bone fragment that projects into the left orbit.  Extraocular movements are intact without entrapment.  Patient denies any visual changes or eye pain.  I spoke with Dr. Carmela Rima who advised the patient not blow her nose and he will follow-up on an outpatient basis with likely referral to plastics ophthalmology [facility]). I discussed case with Dr. Suszanne Conners of ENT who will consult on the patient at time of admission. Will place consult to trauma for admission.   EKG, chest x-ray, troponin and lab  work reassuring as above.  Patient is noted to have elevated creatinine at 1.7 as well as BUN of 45.  Do no suspect this is what caused her syncopal episode.    Dr. Cliffton Asters to consult on the patient for trauma surgery. Asked for consult to hospitalist for admission due to syncopal episode from hypoglycemia. Will call for admission.   Dr. Clyde Lundborg to admit the patient to  the hospitalist service.  Patient is in agreement and appears safe for admission.  Final Clinical Impressions(s) / ED Diagnoses   Final diagnoses:  Subarachnoid bleed (HCC)  Closed fracture of maxillary sinus, initial encounter (HCC)  Closed fracture of left orbital floor, initial encounter (HCC)  Syncope, unspecified syncope type  Hypoglycemia  History of kidney transplant  Fall, initial encounter    ED Discharge Orders    None       Princella Pellegrini 10/04/17 0047    Rolan Bucco, MD 10/04/17 2009

## 2017-10-03 NOTE — ED Notes (Signed)
Pt in CT.

## 2017-10-04 ENCOUNTER — Encounter (HOSPITAL_COMMUNITY): Payer: Self-pay | Admitting: Internal Medicine

## 2017-10-04 ENCOUNTER — Inpatient Hospital Stay (HOSPITAL_COMMUNITY): Payer: Medicare Other

## 2017-10-04 ENCOUNTER — Emergency Department (HOSPITAL_COMMUNITY): Payer: Medicare Other

## 2017-10-04 DIAGNOSIS — N183 Chronic kidney disease, stage 3 unspecified: Secondary | ICD-10-CM | POA: Diagnosis present

## 2017-10-04 DIAGNOSIS — I609 Nontraumatic subarachnoid hemorrhage, unspecified: Secondary | ICD-10-CM

## 2017-10-04 DIAGNOSIS — S066X9A Traumatic subarachnoid hemorrhage with loss of consciousness of unspecified duration, initial encounter: Secondary | ICD-10-CM | POA: Diagnosis present

## 2017-10-04 DIAGNOSIS — E1022 Type 1 diabetes mellitus with diabetic chronic kidney disease: Secondary | ICD-10-CM | POA: Diagnosis present

## 2017-10-04 DIAGNOSIS — E162 Hypoglycemia, unspecified: Secondary | ICD-10-CM | POA: Diagnosis present

## 2017-10-04 DIAGNOSIS — R55 Syncope and collapse: Secondary | ICD-10-CM | POA: Diagnosis not present

## 2017-10-04 DIAGNOSIS — Z9641 Presence of insulin pump (external) (internal): Secondary | ICD-10-CM | POA: Diagnosis present

## 2017-10-04 DIAGNOSIS — Z794 Long term (current) use of insulin: Secondary | ICD-10-CM | POA: Diagnosis not present

## 2017-10-04 DIAGNOSIS — I129 Hypertensive chronic kidney disease with stage 1 through stage 4 chronic kidney disease, or unspecified chronic kidney disease: Secondary | ICD-10-CM | POA: Diagnosis present

## 2017-10-04 DIAGNOSIS — S02401A Maxillary fracture, unspecified, initial encounter for closed fracture: Secondary | ICD-10-CM

## 2017-10-04 DIAGNOSIS — E10649 Type 1 diabetes mellitus with hypoglycemia without coma: Secondary | ICD-10-CM | POA: Diagnosis present

## 2017-10-04 DIAGNOSIS — I10 Essential (primary) hypertension: Secondary | ICD-10-CM

## 2017-10-04 DIAGNOSIS — E109 Type 1 diabetes mellitus without complications: Secondary | ICD-10-CM | POA: Diagnosis present

## 2017-10-04 DIAGNOSIS — E1129 Type 2 diabetes mellitus with other diabetic kidney complication: Secondary | ICD-10-CM | POA: Diagnosis not present

## 2017-10-04 DIAGNOSIS — S0240DA Maxillary fracture, left side, initial encounter for closed fracture: Secondary | ICD-10-CM | POA: Diagnosis present

## 2017-10-04 DIAGNOSIS — W19XXXA Unspecified fall, initial encounter: Secondary | ICD-10-CM | POA: Diagnosis not present

## 2017-10-04 DIAGNOSIS — Z940 Kidney transplant status: Secondary | ICD-10-CM

## 2017-10-04 DIAGNOSIS — S0232XA Fracture of orbital floor, left side, initial encounter for closed fracture: Secondary | ICD-10-CM | POA: Diagnosis not present

## 2017-10-04 DIAGNOSIS — E785 Hyperlipidemia, unspecified: Secondary | ICD-10-CM | POA: Diagnosis present

## 2017-10-04 DIAGNOSIS — W1830XA Fall on same level, unspecified, initial encounter: Secondary | ICD-10-CM | POA: Diagnosis present

## 2017-10-04 LAB — BASIC METABOLIC PANEL
Anion gap: 12 (ref 5–15)
BUN: 46 mg/dL — AB (ref 6–20)
CHLORIDE: 103 mmol/L (ref 101–111)
CO2: 18 mmol/L — AB (ref 22–32)
CREATININE: 1.68 mg/dL — AB (ref 0.44–1.00)
Calcium: 8.2 mg/dL — ABNORMAL LOW (ref 8.9–10.3)
GFR calc Af Amer: 37 mL/min — ABNORMAL LOW (ref >60)
GFR calc non Af Amer: 32 mL/min — ABNORMAL LOW (ref >60)
Glucose, Bld: 177 mg/dL — ABNORMAL HIGH (ref 65–99)
Potassium: 3.3 mmol/L — ABNORMAL LOW (ref 3.5–5.1)
SODIUM: 133 mmol/L — AB (ref 135–145)

## 2017-10-04 LAB — URINALYSIS, ROUTINE W REFLEX MICROSCOPIC
BILIRUBIN URINE: NEGATIVE
Glucose, UA: NEGATIVE mg/dL
HGB URINE DIPSTICK: NEGATIVE
KETONES UR: 5 mg/dL — AB
Leukocytes, UA: NEGATIVE
NITRITE: NEGATIVE
PH: 5 (ref 5.0–8.0)
Protein, ur: NEGATIVE mg/dL
Specific Gravity, Urine: 1.015 (ref 1.005–1.030)

## 2017-10-04 LAB — CBC
HCT: 40.3 % (ref 36.0–46.0)
Hemoglobin: 12.9 g/dL (ref 12.0–15.0)
MCH: 30.9 pg (ref 26.0–34.0)
MCHC: 32 g/dL (ref 30.0–36.0)
MCV: 96.4 fL (ref 78.0–100.0)
PLATELETS: 166 10*3/uL (ref 150–400)
RBC: 4.18 MIL/uL (ref 3.87–5.11)
RDW: 14.8 % (ref 11.5–15.5)
WBC: 6.7 10*3/uL (ref 4.0–10.5)

## 2017-10-04 LAB — GLUCOSE, CAPILLARY
Glucose-Capillary: 52 mg/dL — ABNORMAL LOW (ref 65–99)
Glucose-Capillary: 93 mg/dL (ref 65–99)
Glucose-Capillary: 146 mg/dL — ABNORMAL HIGH (ref 65–99)

## 2017-10-04 LAB — CBG MONITORING, ED
GLUCOSE-CAPILLARY: 200 mg/dL — AB (ref 65–99)
GLUCOSE-CAPILLARY: 149 mg/dL — AB (ref 65–99)
Glucose-Capillary: 80 mg/dL (ref 65–99)
Glucose-Capillary: 80 mg/dL (ref 65–99)

## 2017-10-04 LAB — BRAIN NATRIURETIC PEPTIDE: B Natriuretic Peptide: 379.4 pg/mL — ABNORMAL HIGH (ref 0.0–100.0)

## 2017-10-04 LAB — CORTISOL-AM, BLOOD: Cortisol - AM: 90 ug/dL — ABNORMAL HIGH (ref 6.7–22.6)

## 2017-10-04 LAB — HIV ANTIBODY (ROUTINE TESTING W REFLEX): HIV Screen 4th Generation wRfx: NONREACTIVE

## 2017-10-04 MED ORDER — ACETAMINOPHEN 325 MG PO TABS: 650.0000 mg | ORAL_TABLET | Freq: Four times a day (QID) | ORAL | Status: DC | PRN

## 2017-10-04 MED ORDER — ZOLPIDEM TARTRATE 5 MG PO TABS: 5.0000 mg | ORAL_TABLET | Freq: Every evening | ORAL | Status: DC | PRN

## 2017-10-04 MED ORDER — FERROUS SULFATE 325 (65 FE) MG PO TABS
325.0000 mg | ORAL_TABLET | Freq: Every day | ORAL | Status: DC
  Administered 2017-10-05: 325 mg via ORAL
  Filled 2017-10-04 (×2): qty 1

## 2017-10-04 MED ORDER — HYDRALAZINE HCL 20 MG/ML IJ SOLN: 5.0000 mg | INTRAMUSCULAR | Status: DC | PRN

## 2017-10-04 MED ORDER — DOXAZOSIN MESYLATE 4 MG PO TABS
4.0000 mg | ORAL_TABLET | Freq: Every evening | ORAL | Status: DC
  Administered 2017-10-04: 4 mg via ORAL
  Filled 2017-10-04 (×3): qty 1

## 2017-10-04 MED ORDER — PREDNISONE 5 MG PO TABS
5.0000 mg | ORAL_TABLET | Freq: Every day | ORAL | Status: DC
Start: 2017-10-04 — End: 2017-10-05
  Administered 2017-10-05: 5 mg via ORAL
  Filled 2017-10-04: qty 1

## 2017-10-04 MED ORDER — ONDANSETRON HCL 4 MG/2ML IJ SOLN: 4.0000 mg | Freq: Three times a day (TID) | INTRAMUSCULAR | Status: DC | PRN

## 2017-10-04 MED ORDER — INSULIN PUMP
Freq: Three times a day (TID) | SUBCUTANEOUS | Status: DC
  Administered 2017-10-04 (×2): via SUBCUTANEOUS
  Administered 2017-10-05: 1.09 via SUBCUTANEOUS
  Filled 2017-10-04: qty 1

## 2017-10-04 MED ORDER — METOPROLOL TARTRATE 50 MG PO TABS
50.0000 mg | ORAL_TABLET | Freq: Two times a day (BID) | ORAL | Status: DC
  Administered 2017-10-04 – 2017-10-05 (×2): 50 mg via ORAL
  Filled 2017-10-04 (×2): qty 1

## 2017-10-04 MED ORDER — PRAVASTATIN SODIUM 40 MG PO TABS: 40.0000 mg | ORAL_TABLET | ORAL | Status: DC

## 2017-10-04 MED ORDER — ISOSORBIDE MONONITRATE ER 60 MG PO TB24
60.0000 mg | ORAL_TABLET | Freq: Two times a day (BID) | ORAL | Status: DC
  Administered 2017-10-05: 60 mg via ORAL
  Filled 2017-10-04 (×2): qty 1

## 2017-10-04 MED ORDER — SULFAMETHOXAZOLE-TRIMETHOPRIM 400-80 MG PO TABS
1.0000 | ORAL_TABLET | ORAL | Status: DC
  Administered 2017-10-05: 1 via ORAL
  Filled 2017-10-04: qty 1

## 2017-10-04 MED ORDER — TACROLIMUS 0.5 MG PO CAPS: 0.5000 mg | ORAL_CAPSULE | Freq: Two times a day (BID) | ORAL | Status: DC

## 2017-10-04 MED ORDER — MYCOPHENOLATE SODIUM 180 MG PO TBEC
180.0000 mg | DELAYED_RELEASE_TABLET | Freq: Every day | ORAL | Status: DC
  Filled 2017-10-04: qty 1

## 2017-10-04 MED ORDER — SODIUM CHLORIDE 0.9 % IV SOLN
INTRAVENOUS | Status: DC
  Administered 2017-10-04: 03:00:00 via INTRAVENOUS

## 2017-10-04 MED ORDER — VITAMIN D 1000 UNITS PO TABS
2000.0000 [IU] | ORAL_TABLET | Freq: Every day | ORAL | Status: DC
  Administered 2017-10-04 – 2017-10-05 (×2): 2000 [IU] via ORAL
  Filled 2017-10-04 (×2): qty 2

## 2017-10-04 MED ORDER — MYCOPHENOLATE SODIUM 180 MG PO TBEC
360.0000 mg | DELAYED_RELEASE_TABLET | ORAL | Status: DC
  Administered 2017-10-05: 360 mg via ORAL
  Filled 2017-10-04: qty 2

## 2017-10-04 MED ORDER — DEXTROSE 50 % IV SOLN: 50.0000 mL | INTRAVENOUS | Status: DC | PRN

## 2017-10-04 MED ORDER — TACROLIMUS 1 MG PO CAPS
2.5000 mg | ORAL_CAPSULE | Freq: Two times a day (BID) | ORAL | Status: DC
  Administered 2017-10-05: 2.5 mg via ORAL
  Filled 2017-10-04 (×3): qty 1

## 2017-10-04 MED ORDER — MYCOPHENOLATE SODIUM 180 MG PO TBEC
180.0000 mg | DELAYED_RELEASE_TABLET | ORAL | Status: DC
Start: 2017-10-04 — End: 2017-10-04

## 2017-10-04 MED ORDER — TACROLIMUS 1 MG PO CAPS: 2.0000 mg | ORAL_CAPSULE | Freq: Two times a day (BID) | ORAL | Status: DC

## 2017-10-04 MED ORDER — HYDRALAZINE HCL 50 MG PO TABS
100.0000 mg | ORAL_TABLET | Freq: Three times a day (TID) | ORAL | Status: DC
Start: 2017-10-04 — End: 2017-10-05
  Administered 2017-10-04 – 2017-10-05 (×2): 100 mg via ORAL
  Filled 2017-10-04: qty 2
  Filled 2017-10-04 (×3): qty 1

## 2017-10-04 MED ORDER — HYDROCORTISONE NA SUCCINATE PF 100 MG IJ SOLR
50.0000 mg | Freq: Once | INTRAMUSCULAR | Status: AC
  Administered 2017-10-04: 50 mg via INTRAVENOUS
  Filled 2017-10-04: qty 2

## 2017-10-04 MED ORDER — POTASSIUM CHLORIDE CRYS ER 20 MEQ PO TBCR
40.0000 meq | EXTENDED_RELEASE_TABLET | Freq: Once | ORAL | Status: AC
  Administered 2017-10-04: 40 meq via ORAL
  Filled 2017-10-04: qty 2

## 2017-10-04 NOTE — ED Notes (Signed)
When going into pt's room pt states she took her home morning medications.

## 2017-10-04 NOTE — ED Notes (Signed)
Pt ambulated to the restroom independently, steady gait noted. Pt AOX4.

## 2017-10-04 NOTE — Progress Notes (Signed)
Pateint is a 61 y/o with brittle diabetes who presents to the hospital after a fall suspected to be secondary to hypoglycemia. Developed facial fractures and traumatic subarachnoid hemorrhage. No surgery found by ENT or Neurosurgeon.  Plan is to observe for 24 hours and ensure blood sugars remain stable during that time frame. I realize patient wants to go home but from my standpoint I would like to ensure blood sugar stability before discharging.  If no operation planned may put in for a diabetic diet.  Gen: pt in nad, alert and awake Cv: no cyanosis Pulm: no increased wob, no wheezes  Hannah Valentine

## 2017-10-04 NOTE — Progress Notes (Signed)
Patient ID: Hannah Valentine, female   DOB: 09/01/1956, 61 y.o.   MRN: [id number] Subjective: The patient is alert and pleasant.  Her husband is at the bedside.  She wants to go home.  Objective: Vital signs in last 24 hours: Temp:  [98.4 F (36.9 C)] 98.4 F (36.9 C) (12/30 0045) Pulse Rate:  [56-74] 64 (12/30 1000) Resp:  [12-22] 13 (12/30 1000) BP: (113-169)/(33-78) 137/55 (12/30 1000) SpO2:  [93 %-99 %] 99 % (12/30 1000) Weight:  [68 kg (150 lb)] 68 kg (150 lb) (12/29 1921)  Intake/Output from previous day: 12/29 0701 - 12/30 0700 In: 405 [I.V.:300; IV Piggyback:105] Out: -  Intake/Output this shift: No intake/output data recorded.  Physical exam patient is alert and oriented x3.  Glasgow Coma Scale 15.  She is moving all 4 extremities well.  I have reviewed her follow-up head CT performed today.  Her small subarachnoid hemorrhage is resolving.  Lab Results: Recent Labs    10/03/17 1950 10/04/17 0400  WBC 7.3 6.7  HGB 14.0 12.9  HCT 43.3 40.3  PLT 175 166   BMET Recent Labs    10/03/17 1950 10/04/17 0400  NA 139 133*  K 3.5 3.3*  CL 106 103  CO2 23 18*  GLUCOSE 102* 177*  BUN 45* 46*  CREATININE 1.70* 1.68*  CALCIUM 8.8* 8.2*    Studies/Results: Dg Chest 2 View  Result Date: 10/03/2017 CLINICAL DATA:  Recent syncopal episode EXAM: CHEST  2 VIEW COMPARISON:  03/14/2015 FINDINGS: Cardiac shadow is enlarged. Aortic calcifications are again seen. The lungs are well aerated bilaterally without focal infiltrate or sizable effusion. No acute bony abnormality is seen. IMPRESSION: No active cardiopulmonary disease. Electronically Signed   By: Alcide CleverMark  Lukens M.D.   On: 10/03/2017 21:20   Ct Head Wo Contrast  Result Date: 10/04/2017 CLINICAL DATA:  Followup intracranial hemorrhage. EXAM: CT HEAD WITHOUT CONTRAST TECHNIQUE: Contiguous axial images were obtained from the base of the skull through the vertex without intravenous contrast. COMPARISON:  10/03/2017 FINDINGS:  Brain: Subtle area of subarachnoid hemorrhage within the left parietal region is less conspicuous on today's study, image 18 of series 3. No evidence for progression of hemorrhage. No new areas of hemorrhage identified. No subdural hematoma identified. No evidence for acute brain infarct. No hydrocephalus. Vascular: No hyperdense vessel or unexpected calcification. Skull: Normal. Negative for fracture or focal lesion. Sinuses/Orbits: Left floor of orbit fracture is again identified, image 14 of series 5. Left maxillary sinus fracture is also again noted. Opacification of the left maxillary sinus is unchanged. Left preseptal hematoma is stable. Other: None IMPRESSION: 1. Stable to improved appearance of left parietal subarachnoid hemorrhage. 2. Fracture of left maxillary sinus and left orbital floor. Electronically Signed   By: Signa Kellaylor  Stroud M.D.   On: 10/04/2017 07:58   Ct Head Wo Contrast  Result Date: 10/03/2017 CLINICAL DATA:  Facial trauma EXAM: CT HEAD WITHOUT CONTRAST CT MAXILLOFACIAL WITHOUT CONTRAST TECHNIQUE: Multidetector CT imaging of the head and maxillofacial structures were performed using the standard protocol without intravenous contrast. Multiplanar CT image reconstructions of the maxillofacial structures were also generated. COMPARISON:  None. FINDINGS: CT HEAD FINDINGS Brain: Ventricle size normal.  Negative for acute infarct or mass Small amount of subarachnoid hemorrhage left parietal region. No subdural hematoma or midline shift. Vascular: Negative for hyperdense vessel Skull: Left facial fractures described below. Negative for skull fracture. Other: None CT MAXILLOFACIAL FINDINGS Osseous: Fracture of the left maxillary sinus involving the anterior and lateral walls with  mild displacement. There is blood in the left maxillary sinus. Mildly displaced fracture of the left orbital floor. Zygomatic arch intact. No other facial fractures. Moderate to advanced degenerative change right TMJ.  Orbits: Mildly displaced fracture left orbital floor with bone fragment projecting into the left orbit. Sinuses: Blood in the left maxillary sinus.  Remaining sinuses clear Soft tissues: Soft tissue hematoma lateral to the left orbit IMPRESSION: 1. Small volume subarachnoid hemorrhage left parietal region 2. Fracture of the left maxillary sinus and left orbital floor. Small bone fragment projects into the left orbit. These results were called by telephone at the time of interpretation on 10/03/2017 at 8:58 pm to Dr. Leary RocaMICHAEL MACZIS , who verbally acknowledged these results. Electronically Signed   By: Marlan Palauharles  Clark M.D.   On: 10/03/2017 20:58   Dg Hand 2 View Left  Result Date: 10/04/2017 CLINICAL DATA:  Status post fall, with left hand pain. Initial encounter. EXAM: LEFT HAND - 2 VIEW COMPARISON:  None. FINDINGS: There is no evidence of fracture or dislocation. The joint spaces are preserved. The carpal rows are intact, and demonstrate normal alignment. The soft tissues are unremarkable in appearance. Postoperative change is noted about the wrist. IMPRESSION: No evidence of fracture or dislocation. Electronically Signed   By: Roanna RaiderJeffery  Chang M.D.   On: 10/04/2017 00:58   Ct Maxillofacial Wo Cm  Result Date: 10/03/2017 CLINICAL DATA:  Facial trauma EXAM: CT HEAD WITHOUT CONTRAST CT MAXILLOFACIAL WITHOUT CONTRAST TECHNIQUE: Multidetector CT imaging of the head and maxillofacial structures were performed using the standard protocol without intravenous contrast. Multiplanar CT image reconstructions of the maxillofacial structures were also generated. COMPARISON:  None. FINDINGS: CT HEAD FINDINGS Brain: Ventricle size normal.  Negative for acute infarct or mass Small amount of subarachnoid hemorrhage left parietal region. No subdural hematoma or midline shift. Vascular: Negative for hyperdense vessel Skull: Left facial fractures described below. Negative for skull fracture. Other: None CT MAXILLOFACIAL FINDINGS  Osseous: Fracture of the left maxillary sinus involving the anterior and lateral walls with mild displacement. There is blood in the left maxillary sinus. Mildly displaced fracture of the left orbital floor. Zygomatic arch intact. No other facial fractures. Moderate to advanced degenerative change right TMJ. Orbits: Mildly displaced fracture left orbital floor with bone fragment projecting into the left orbit. Sinuses: Blood in the left maxillary sinus.  Remaining sinuses clear Soft tissues: Soft tissue hematoma lateral to the left orbit IMPRESSION: 1. Small volume subarachnoid hemorrhage left parietal region 2. Fracture of the left maxillary sinus and left orbital floor. Small bone fragment projects into the left orbit. These results were called by telephone at the time of interpretation on 10/03/2017 at 8:58 pm to Dr. Leary RocaMICHAEL MACZIS , who verbally acknowledged these results. Electronically Signed   By: Marlan Palauharles  Clark M.D.   On: 10/03/2017 20:58    Assessment/Plan: Traumatic subarachnoid hemorrhage: The patient is doing well and can be discharged from a neurosurgical point of view.  She can follow-up with Dr. Bevely Palmeritty as needed.  Sinus and facial fractures: Dr. Reita Chardeuh is managing.  Hypoglycemia: Dr. Cena BentonVega is managing.  Please call if we can be of further assistance.  LOS: 0 days     Hannah Valentine 10/04/2017, 10:41 AM

## 2017-10-04 NOTE — H&P (Signed)
History and Physical    Hannah Valentine ZOX:[id number] DOB: 1956-02-21 DOA: 10/03/2017  Referring MD/NP/PA:   PCP: [name], PA   Patient coming from:  The patient is coming from home.  At baseline, pt is independent for most of ADL.     Chief Complaint: Syncope and fall  HPI: Hannah Valentine is a 61 y.o. 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  Per her husband, pt passed out when she was out out on her driveway going to look at the deer in her yard. Her husband says that she fell face first and hit her head on the concrete.  She was unconscious for approximately 5 minutes and upon awakening was confused for ~10 minues.   patient denies any prodrome of symptoms, no urinary unilateral weakness, numbness or tenderness extremities. No facial droop, slurred speech, vision change or hearing loss. Denies any chest pain palpitation. She had bruise and swelling over left orbit and abrasion to the left side of her face.   Per her husband, she has had similar events due to hypoglycemia in the past. He took a CBG that showed that her blood sugar was 37. He administered 1 unit of glucagon and gave 5 g of dextrose.  After administration, CBG 123 by EMS. Patient denies any nausea vomiting, diarrhea, abdominal pain, symptoms of UTI. Patient states that she has some mild pain.  ED Course: pt was found to have WBC 7.3, negative troponin, renal function close to baseline, no fever, no tachypnea, oxygen saturation 98% on room air. CXR and left hand x-ray are negative. Patient is admitted to stepdown as inpatient. Neurosurgeon, Dr. Cliffton Asters of trauma surgeon, Dr. Suszanne Conners of  [facility] Doctor Dr.Patel of Allena Katz of ophthalmologist were consulted.  CT-head and maxillofacial showed: 1. Small volume subarachnoid hemorrhage left parietal region 2. Fracture of the left maxillary sinus and left  orbital floor. Small bone fragment projects into the left orbit.  Review of Systems:   General: no fevers, chills, no body weight gain, has poor appetite, has fatigue HEENT: no blurry vision, hearing changes or sore throat Respiratory: no dyspnea, coughing, wheezing CV: no chest pain, no palpitations GI: no nausea, vomiting, abdominal pain, diarrhea, constipation GU: no dysuria, burning on urination, increased urinary frequency, hematuria  Ext: has trace leg edema Neuro: no unilateral weakness, numbness, or tingling, no vision change or hearing loss. Had syncope and fall. Skin: has bruise in face and left eye MSK: No muscle spasm, no deformity, no limitation of range of movement in spin Heme: No easy bruising.  Travel history: No recent long distant travel.  Allergy:  Allergies  Allergen Reactions  . Atorvastatin Other (See Comments)    Muscle weakness  . Hydrocodone-Acetaminophen Nausea Only  . Labetalol Other (See Comments)    "makes her feel bad"  . Meperidine Nausea Only  . Pregabalin Other (See Comments)    dizzy    Past Medical History:  Diagnosis Date  . CKD (chronic kidney disease), stage III (HCC)   . Essential hypertension   . HLD (hyperlipidemia)   . Kidney transplanted 2017  . Type II diabetes mellitus with renal manifestations [facility])     Past Surgical History:  Procedure Laterality Date  . NEPHRECTOMY TRANSPLANTED ORGAN      Social History:  reports that  has never smoked. she has never used smokeless tobacco. She reports that she does not drink alcohol or use  drugs.  Family History:  Family History  Problem Relation Age of Onset  . CAD Mother   . Lung cancer Mother   . Stroke Father   . Heart attack Father   . Diabetes Mellitus I Father   . Hypertension Father   . Breast cancer Sister   . Hypertension Sister   . Heart attack Brother      Prior to Admission medications   Medication Sig Start Date End Date Taking? Authorizing Provider  aspirin  EC 81 MG tablet Take 81 mg by mouth daily.   Yes [provider]  Cholecalciferol (VITAMIN D) 2000 units tablet Take 2,000 Units by mouth daily. 09/24/17  Yes [provider]  doxazosin (CARDURA) 4 MG tablet Take 4 mg by mouth every evening. 07/24/17  Yes [provider]  ferrous sulfate 325 (65 FE) MG EC tablet Take 325 mg by mouth daily.   Yes [provider]  furosemide (LASIX) 80 MG tablet Take 80 mg by mouth daily. 07/08/17  Yes [provider]  hydrALAZINE (APRESOLINE) 100 MG tablet Take 100 mg by mouth 3 (three) times daily. 07/24/17  Yes [provider]  Insulin Human (INSULIN PUMP) SOLN Inject into the skin continuous. Novolog   Yes [provider]  isosorbide mononitrate (IMDUR) 60 MG 24 hr tablet Take 60 mg by mouth 2 (two) times daily. 04/09/17  Yes [provider]  metoprolol tartrate (LOPRESSOR) 25 MG tablet Take 50 mg by mouth 2 (two) times daily. 06/22/17  Yes [provider]  mycophenolate (MYFORTIC) 180 MG EC tablet Take 180-360 mg by mouth See admin instructions. Take 2 capsules every morning and take 1 capsule every evening 06/04/17  Yes [provider]  pravastatin (PRAVACHOL) 40 MG tablet Take 40 mg by mouth every Monday, Wednesday, and Friday. 09/04/17  Yes [provider]  predniSONE (DELTASONE) 5 MG tablet Take 5 mg by mouth daily. 07/24/17  Yes [provider]  sulfamethoxazole-trimethoprim (BACTRIM,SEPTRA) 400-80 MG tablet Take 1 tablet by mouth every Monday, Wednesday, and Friday. 09/05/17  Yes [provider]  tacrolimus (PROGRAF) 0.5 MG capsule Take 0.5 mg by mouth 2 (two) times daily. Take with Prograf 1 mg to equal 2.5 mg 07/06/17  Yes [provider]  tacrolimus (PROGRAF) 1 MG capsule Take 2 mg by mouth 2 (two) times daily. Take with Prograf 0.5 mg to equal 2.5 mg 07/06/17  Yes [provider]    Physical Exam: Vitals:   10/04/17 0045  10/04/17 0048 10/04/17 0212 10/04/17 0213  BP:  (!) 143/52 (!) 137/49   Pulse:  74  66  Resp:  16 17   Temp: 98.4 F (36.9 C)     TempSrc: Oral     SpO2:  97%  93%  Weight:      Height:       General: Not in acute distress HEENT: has bruise in face and left eye. Has welling in left orbit.       Eyes: PERRL, EOMI, no scleral icterus.       ENT: No discharge from the ears and nose, no pharynx injection, no tonsillar enlargement.        Neck: No JVD, no bruit, no mass felt. Heme: No neck lymph node enlargement. Cardiac: S1/S2, RRR, No murmurs, No gallops or rubs. Respiratory: Good air movement bilaterally. No rales, wheezing, rhonchi or rubs. GI: Soft, nondistended, nontender, no rebound pain, no organomegaly, BS present. GU: No hematuria Ext: has trace leg edema  bilaterally. 2+DP/PT pulse bilaterally. Musculoskeletal: No joint deformities, No joint redness or warmth, no limitation of ROM in spin. Skin: No rashes.  Neuro: Alert, oriented X3, cranial nerves II-XII grossly intact, moves all extremities normally.  Psych: Patient is not psychotic, no suicidal or hemocidal ideation.  Labs on Admission: I have personally reviewed following labs and imaging studies  CBC: Recent Labs  Lab 10/03/17 1950 10/04/17 0400  WBC 7.3 6.7  NEUTROABS 5.8  --   HGB 14.0 12.9  HCT 43.3 40.3  MCV 96.0 96.4  PLT 175 166   Basic Metabolic Panel: Recent Labs  Lab 10/03/17 1950 10/04/17 0400  NA 139 133*  K 3.5 3.3*  CL 106 103  CO2 23 18*  GLUCOSE 102* 177*  BUN 45* 46*  CREATININE 1.70* 1.68*  CALCIUM 8.8* 8.2*   GFR: Estimated Creatinine Clearance: 31.2 mL/min (A) (by C-G formula based on SCr of 1.68 mg/dL (H)). Liver Function Tests: Recent Labs  Lab 10/03/17 1950  AST 33  ALT 20  ALKPHOS 62  BILITOT 0.7  PROT 7.2  ALBUMIN 3.9   No results for input(s): LIPASE, AMYLASE in the last 168 hours. No results for input(s): AMMONIA in the last 168 hours. Coagulation  Profile: Recent Labs  Lab 10/03/17 1950  INR 1.02   Cardiac Enzymes: No results for input(s): CKTOTAL, CKMB, CKMBINDEX, TROPONINI in the last 168 hours. BNP (last 3 results) No results for input(s): PROBNP in the last 8760 hours. HbA1C: No results for input(s): HGBA1C in the last 72 hours. CBG: Recent Labs  Lab 10/03/17 1958 10/03/17 2207 10/04/17 0212  GLUCAP 97 81 200*   Lipid Profile: No results for input(s): CHOL, HDL, LDLCALC, TRIG, CHOLHDL, LDLDIRECT in the last 72 hours. Thyroid Function Tests: No results for input(s): TSH, T4TOTAL, FREET4, T3FREE, THYROIDAB in the last 72 hours. Anemia Panel: No results for input(s): VITAMINB12, FOLATE, FERRITIN, TIBC, IRON, RETICCTPCT in the last 72 hours. Urine analysis:    Component Value Date/Time   COLORURINE YELLOW 10/04/2017 0300   APPEARANCEUR CLEAR 10/04/2017 0300   LABSPEC 1.015 10/04/2017 0300   PHURINE 5.0 10/04/2017 0300   GLUCOSEU NEGATIVE 10/04/2017 0300   HGBUR NEGATIVE 10/04/2017 0300   BILIRUBINUR NEGATIVE 10/04/2017 0300   KETONESUR 5 (A) 10/04/2017 0300   PROTEINUR NEGATIVE 10/04/2017 0300   NITRITE NEGATIVE 10/04/2017 0300   LEUKOCYTESUR NEGATIVE 10/04/2017 0300   Sepsis Labs: @LABRCNTIP (procalcitonin:4,lacticidven:4) )No results found for this or any previous visit (from the past 240 hour(s)).   Radiological Exams on Admission: Dg Chest 2 View  Result Date: 10/03/2017 CLINICAL DATA:  Recent syncopal episode EXAM: CHEST  2 VIEW COMPARISON:  03/14/2015 FINDINGS: Cardiac shadow is enlarged. Aortic calcifications are again seen. The lungs are well aerated bilaterally without focal infiltrate or sizable effusion. No acute bony abnormality is seen. IMPRESSION: No active cardiopulmonary disease. Electronically Signed   By: Alcide Clever M.D.   On: 10/03/2017 21:20   Ct Head Wo Contrast  Result Date: 10/03/2017 CLINICAL DATA:  Facial trauma EXAM: CT HEAD WITHOUT CONTRAST CT MAXILLOFACIAL WITHOUT CONTRAST  TECHNIQUE: Multidetector CT imaging of the head and maxillofacial structures were performed using the standard protocol without intravenous contrast. Multiplanar CT image reconstructions of the maxillofacial structures were also generated. COMPARISON:  None. FINDINGS: CT HEAD FINDINGS Brain: Ventricle size normal.  Negative for acute infarct or mass Small amount of subarachnoid hemorrhage left parietal region. No subdural hematoma or midline shift. Vascular: Negative for hyperdense vessel Skull: Left facial fractures described below.  Negative for skull fracture. Other: None CT MAXILLOFACIAL FINDINGS Osseous: Fracture of the left maxillary sinus involving the anterior and lateral walls with mild displacement. There is blood in the left maxillary sinus. Mildly displaced fracture of the left orbital floor. Zygomatic arch intact. No other facial fractures. Moderate to advanced degenerative change right TMJ. Orbits: Mildly displaced fracture left orbital floor with bone fragment projecting into the left orbit. Sinuses: Blood in the left maxillary sinus.  Remaining sinuses clear Soft tissues: Soft tissue hematoma lateral to the left orbit IMPRESSION: 1. Small volume subarachnoid hemorrhage left parietal region 2. Fracture of the left maxillary sinus and left orbital floor. Small bone fragment projects into the left orbit. These results were called by telephone at the time of interpretation on 10/03/2017 at 8:58 pm to Dr. Leary RocaMICHAEL MACZIS , who verbally acknowledged these results. Electronically Signed   By: Marlan Palauharles  Clark M.D.   On: 10/03/2017 20:58   Dg Hand 2 View Left  Result Date: 10/04/2017 CLINICAL DATA:  Status post fall, with left hand pain. Initial encounter. EXAM: LEFT HAND - 2 VIEW COMPARISON:  None. FINDINGS: There is no evidence of fracture or dislocation. The joint spaces are preserved. The carpal rows are intact, and demonstrate normal alignment. The soft tissues are unremarkable in appearance.  Postoperative change is noted about the wrist. IMPRESSION: No evidence of fracture or dislocation. Electronically Signed   By: Roanna RaiderJeffery  Chang M.D.   On: 10/04/2017 00:58   Ct Maxillofacial Wo Cm  Result Date: 10/03/2017 CLINICAL DATA:  Facial trauma EXAM: CT HEAD WITHOUT CONTRAST CT MAXILLOFACIAL WITHOUT CONTRAST TECHNIQUE: Multidetector CT imaging of the head and maxillofacial structures were performed using the standard protocol without intravenous contrast. Multiplanar CT image reconstructions of the maxillofacial structures were also generated. COMPARISON:  None. FINDINGS: CT HEAD FINDINGS Brain: Ventricle size normal.  Negative for acute infarct or mass Small amount of subarachnoid hemorrhage left parietal region. No subdural hematoma or midline shift. Vascular: Negative for hyperdense vessel Skull: Left facial fractures described below. Negative for skull fracture. Other: None CT MAXILLOFACIAL FINDINGS Osseous: Fracture of the left maxillary sinus involving the anterior and lateral walls with mild displacement. There is blood in the left maxillary sinus. Mildly displaced fracture of the left orbital floor. Zygomatic arch intact. No other facial fractures. Moderate to advanced degenerative change right TMJ. Orbits: Mildly displaced fracture left orbital floor with bone fragment projecting into the left orbit. Sinuses: Blood in the left maxillary sinus.  Remaining sinuses clear Soft tissues: Soft tissue hematoma lateral to the left orbit IMPRESSION: 1. Small volume subarachnoid hemorrhage left parietal region 2. Fracture of the left maxillary sinus and left orbital floor. Small bone fragment projects into the left orbit. These results were called by telephone at the time of interpretation on 10/03/2017 at 8:58 pm to Dr. Leary RocaMICHAEL MACZIS , who verbally acknowledged these results. Electronically Signed   By: Marlan Palauharles  Clark M.D.   On: 10/03/2017 20:58     EKG: Independently reviewed.  Sinus rhythm, QTC 472,  anteroseptal infarction pattern.  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 Patient had blood sugars 37 reportedly. No focal neurological findings on physical examination. -Admitted to stepdown as inpatient -Frequent neuro check -Monitor blood sugar every 3 hours  SAH and closed fracture of left orbital floor and closed fracture of maxillary sinus [facility]): Neurosurgeon recommended to repeat CT head in morning. Dr. Cliffton Asters of trauma surgeon, Dr. Suszanne Conners of  ENTand Dr. Allena Katz of [facility] Katz of ophthalmologist were consulted. -will repeat CT-head in AN -prn tylenol for pain  -Keppra 500 mg twice a day per neurosurgery recommendation  Hypoglycemia and type 1 diabetes mellitus with renal complications: pt is using insulin pump. Her CBG is 200s now. Pt dose not want to hold off inuslin pump now.   -will consult to diabetic educator to check insulin pump -Check blood sugar every 3 hours -D50 when necessary  History of kidney transplant: -continue home mycophenolate, prednisone and tacrolimus -Give one dose of Solu-Cortef 50 mg as stress dose - check cortisol level -check tacrolimus level  CKD (chronic kidney disease), stage III: Creatinine 1.4-1.6. Her creatinine is 1.7. Patient states that 1.70 of creatinine is close to her baseline. -Follow up renal function by BMP  HTN:  -hold off lasix when pt is on NPO -Continue home medications: Hydralazine, metoprolol, cardura -IV hydralazine prn  HLD: -pravastatin  DVT ppx: SCD Code Status: Full code Family Communication: Husband at the bedside   Disposition Plan:  Anticipate discharge back to previous home environment Consults called:  Neurosurgeon, ENT, ophthalmologist, trauma surgeon Admission status:  SDU/inpation       Date of Service 10/04/2017    Lorretta Harp [facility] Pager [phone]  If 7PM-7AM, please contact night-coverage [email or web address] Password [email or web address] 10/04/2017, 7:03 AM

## 2017-10-04 NOTE — Progress Notes (Signed)
Inpatient Diabetes Program Recommendations  AACE/ADA: New Consensus Statement on Inpatient Glycemic Control (2015)  Target Ranges:  Prepandial:   less than 140 mg/dL      Peak postprandial:   less than 180 mg/dL (1-2 hours)      Critically ill patients:  140 - 180 mg/dL   Lab Results  Component Value Date   GLUCAP 52 (L) 10/04/2017    Review of Glycemic Control  Diabetes history: DM1 Outpatient Diabetes medications: insulin pump Current orders for Inpatient glycemic control: None - pt has insulin pump   61 y.o. 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 Has hx hypoglycemia events in past. CBG was 37 and pt's husband administered glucagon and 5 g dextrose.  Blood sugars acceptable in ED. Had hypo @ 1519.  Current basal rates are as follows: MN 0.725; 2am 0.625; 6am 0.8; 8:30am 1.25; 3pm 0.75; 5pm 0.875; 9pm 0.875.  Insulin:carb ratios - MN 1:16.  Sensitivity MN 50 with a Target of 120. Active insulin 4 hours.   Inpatient Diabetes Program Recommendations:     Pt to call endo at discharge regarding pump settings. Discussed with RN to allow pt to have appropriate snacks in room and pt tray was ordered. Pt is new to [employer] Pump since October 2018. [place] Insulin Pump Contract to be signed. Pt anxious to d/c.  Diabetes Coordinator to f/u if not discharging on 12/31.  Thank you. Ailene Ardshonda Anwar Crill, RD, LDN, CDE Inpatient Diabetes Coordinator [phone]

## 2017-10-04 NOTE — ED Notes (Signed)
Attempted report 

## 2017-10-04 NOTE — Progress Notes (Signed)
1510 Received patient from ED. Patient alert oriented x4. Resting comfortably in bed.

## 2017-10-04 NOTE — Consult Note (Signed)
Reason for Consult: Facial trauma  HPI:  Hannah Valentine is an 61 y.o. female who came to the ER last night after a syncope episode, resulting is a fall and facial trauma. 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  Per her husband, pt passed out when she was out out on her driveway going to look at the deer in her yard. The patient fell face first and hit her head on the concrete. She was unconscious for approximately 5 minutes and upon awakening was confused for ~10 minues. She has no facial droop, slurred speech, vision change or hearing loss. Denies any chest pain palpitation. Her CT scan in the ER shows fractures of the left maxillary sinus and left orbital floor. Small bone fragment projects into the left orbit.    Past Medical History:  Diagnosis Date  . CKD (chronic kidney disease), stage III (HCC)   . Essential hypertension   . HLD (hyperlipidemia)   . Kidney transplanted 2017  . Type II diabetes mellitus with renal manifestations [facility])     Past Surgical History:  Procedure Laterality Date  . NEPHRECTOMY TRANSPLANTED ORGAN      Family History  Problem Relation Age of Onset  . CAD Mother   . Lung cancer Mother   . Stroke Father   . Heart attack Father   . Diabetes Mellitus I Father   . Hypertension Father   . Breast cancer Sister   . Hypertension Sister   . Heart attack Brother     Social History:  reports that  has never smoked. she has never used smokeless tobacco. She reports that she does not drink alcohol or use drugs.  Allergies:  Allergies  Allergen Reactions  . Atorvastatin Other (See Comments)    Muscle weakness  . Hydrocodone-Acetaminophen Nausea Only  . Labetalol Other (See Comments)    "makes her feel bad"  . Meperidine Nausea Only  . Pregabalin Other (See Comments)    dizzy    Prior to Admission medications   Medication Sig Start Date End  Date Taking? Authorizing Provider  aspirin EC 81 MG tablet Take 81 mg by mouth daily.   Yes [provider]  Cholecalciferol (VITAMIN D) 2000 units tablet Take 2,000 Units by mouth daily. 09/24/17  Yes [provider]  doxazosin (CARDURA) 4 MG tablet Take 4 mg by mouth every evening. 07/24/17  Yes [provider]  ferrous sulfate 325 (65 FE) MG EC tablet Take 325 mg by mouth daily.   Yes [provider]  furosemide (LASIX) 80 MG tablet Take 80 mg by mouth daily. 07/08/17  Yes [provider]  hydrALAZINE (APRESOLINE) 100 MG tablet Take 100 mg by mouth 3 (three) times daily. 07/24/17  Yes [provider]  Insulin Human (INSULIN PUMP) SOLN Inject into the skin continuous. Novolog   Yes [provider]  isosorbide mononitrate (IMDUR) 60 MG 24 hr tablet Take 60 mg by mouth 2 (two) times daily. 04/09/17  Yes [provider]  metoprolol tartrate (LOPRESSOR) 25 MG tablet Take 50 mg by mouth 2 (two) times daily. 06/22/17  Yes [provider]  mycophenolate (MYFORTIC) 180 MG EC tablet Take 180-360 mg by mouth See admin instructions. Take 2 capsules every morning and take 1 capsule every evening 06/04/17  Yes [provider]  pravastatin (PRAVACHOL) 40 MG tablet Take 40 mg by mouth every Monday, Wednesday, and Friday. 09/04/17  Yes [provider]  predniSONE (DELTASONE) 5 MG tablet Take 5 mg by mouth daily. 07/24/17  Yes [provider]  sulfamethoxazole-trimethoprim (BACTRIM,SEPTRA) 400-80 MG tablet Take 1 tablet by mouth every Monday, Wednesday, and Friday. 09/05/17  Yes [provider]  tacrolimus (PROGRAF) 0.5 MG capsule Take 0.5 mg by mouth 2 (two) times daily. Take with Prograf 1 mg to equal 2.5 mg 07/06/17  Yes [provider]  tacrolimus (PROGRAF) 1 MG capsule Take 2 mg by mouth 2 (two) times daily. Take with Prograf 0.5 mg to equal 2.5 mg 07/06/17  Yes [provider]    Dg  Chest 2 View  Result Date: 10/03/2017 CLINICAL DATA:  Recent syncopal episode EXAM: CHEST  2 VIEW COMPARISON:  03/14/2015 FINDINGS: Cardiac shadow is enlarged. Aortic calcifications are again seen. The lungs are well aerated bilaterally without focal infiltrate or sizable effusion. No acute bony abnormality is seen. IMPRESSION: No active cardiopulmonary disease. Electronically Signed   By: Alcide Clever M.D.   On: 10/03/2017 21:20   Ct Head Wo Contrast  Result Date: 10/04/2017 CLINICAL DATA:  Followup intracranial hemorrhage. EXAM: CT HEAD WITHOUT CONTRAST TECHNIQUE: Contiguous axial images were obtained from the base of the skull through the vertex without intravenous contrast. COMPARISON:  10/03/2017 FINDINGS: Brain: Subtle area of subarachnoid hemorrhage within the left parietal region is less conspicuous on today's study, image 18 of series 3. No evidence for progression of hemorrhage. No new areas of hemorrhage identified. No subdural hematoma identified. No evidence for acute brain infarct. No hydrocephalus. Vascular: No hyperdense vessel or unexpected calcification. Skull: Normal. Negative for fracture or focal lesion. Sinuses/Orbits: Left floor of orbit fracture is again identified, image 14 of series 5. Left maxillary sinus fracture is also again noted. Opacification of the left maxillary sinus is unchanged. Left preseptal hematoma is stable. Other: None IMPRESSION: 1. Stable to improved appearance of left parietal subarachnoid hemorrhage. 2. Fracture of left maxillary sinus and left orbital floor. Electronically Signed   By: Signa Kell M.D.   On: 10/04/2017 07:58   Ct Head Wo Contrast  Result Date: 10/03/2017 CLINICAL DATA:  Facial trauma EXAM: CT HEAD WITHOUT CONTRAST CT MAXILLOFACIAL WITHOUT CONTRAST TECHNIQUE: Multidetector CT imaging of the head and maxillofacial structures were performed using the standard protocol without intravenous contrast. Multiplanar CT image reconstructions of  the maxillofacial structures were also generated. COMPARISON:  None. FINDINGS: CT HEAD FINDINGS Brain: Ventricle size normal.  Negative for acute infarct or mass Small amount of subarachnoid hemorrhage left parietal region. No subdural hematoma or midline shift. Vascular: Negative for hyperdense vessel Skull: Left facial fractures described below. Negative for skull fracture. Other: None CT MAXILLOFACIAL FINDINGS Osseous: Fracture of the left maxillary sinus involving the anterior and lateral walls with mild displacement. There is blood in the left maxillary sinus. Mildly displaced fracture of the left orbital floor. Zygomatic arch intact. No other facial fractures. Moderate to advanced degenerative change right TMJ. Orbits: Mildly displaced fracture left orbital floor with bone fragment projecting into the left orbit. Sinuses: Blood in the left maxillary sinus.  Remaining sinuses clear Soft tissues: Soft tissue hematoma lateral to the left orbit IMPRESSION: 1. Small volume subarachnoid hemorrhage left parietal region 2. Fracture of the left maxillary sinus and left orbital floor. Small bone fragment projects into the left orbit. These results were called by telephone at the time of interpretation on 10/03/2017 at 8:58 pm to Dr. Leary Roca , who verbally acknowledged these results. Electronically Signed   By: Leonette Most  Chestine Sporelark M.D.   On: 10/03/2017 20:58   Dg Hand 2 View Left  Result Date: 10/04/2017 CLINICAL DATA:  Status post fall, with left hand pain. Initial encounter. EXAM: LEFT HAND - 2 VIEW COMPARISON:  None. FINDINGS: There is no evidence of fracture or dislocation. The joint spaces are preserved. The carpal rows are intact, and demonstrate normal alignment. The soft tissues are unremarkable in appearance. Postoperative change is noted about the wrist. IMPRESSION: No evidence of fracture or dislocation. Electronically Signed   By: Roanna RaiderJeffery  Chang M.D.   On: 10/04/2017 00:58   Ct Maxillofacial Wo  Cm  Result Date: 10/03/2017 CLINICAL DATA:  Facial trauma EXAM: CT HEAD WITHOUT CONTRAST CT MAXILLOFACIAL WITHOUT CONTRAST TECHNIQUE: Multidetector CT imaging of the head and maxillofacial structures were performed using the standard protocol without intravenous contrast. Multiplanar CT image reconstructions of the maxillofacial structures were also generated. COMPARISON:  None. FINDINGS: CT HEAD FINDINGS Brain: Ventricle size normal.  Negative for acute infarct or mass Small amount of subarachnoid hemorrhage left parietal region. No subdural hematoma or midline shift. Vascular: Negative for hyperdense vessel Skull: Left facial fractures described below. Negative for skull fracture. Other: None CT MAXILLOFACIAL FINDINGS Osseous: Fracture of the left maxillary sinus involving the anterior and lateral walls with mild displacement. There is blood in the left maxillary sinus. Mildly displaced fracture of the left orbital floor. Zygomatic arch intact. No other facial fractures. Moderate to advanced degenerative change right TMJ. Orbits: Mildly displaced fracture left orbital floor with bone fragment projecting into the left orbit. Sinuses: Blood in the left maxillary sinus.  Remaining sinuses clear Soft tissues: Soft tissue hematoma lateral to the left orbit IMPRESSION: 1. Small volume subarachnoid hemorrhage left parietal region 2. Fracture of the left maxillary sinus and left orbital floor. Small bone fragment projects into the left orbit. These results were called by telephone at the time of interpretation on 10/03/2017 at 8:58 pm to Dr. Leary RocaMICHAEL MACZIS , who verbally acknowledged these results. Electronically Signed   By: Marlan Palauharles  Clark M.D.   On: 10/03/2017 20:58   Review of Systems:  General: no fevers, chills, no body weight gain, has poor appetite, has fatigue HEENT: no blurry vision, hearing changes or sore throat Respiratory: no dyspnea, coughing, wheezing CV: no chest pain, no palpitations GI: no  nausea, vomiting, abdominal pain, diarrhea, constipation GU: no dysuria, burning on urination, increased urinary frequency, hematuria  Ext: has trace leg edema Neuro: no unilateral weakness, numbness, or tingling, no vision change or hearing loss. Had syncope and fall. Skin: has bruise in face and left eye MSK: No muscle spasm, no deformity, no limitation of range of movement in spin Heme: No easy bruising.  Travel history: No recent long distant travel.  Blood pressure (!) 137/55, pulse 64, temperature 98.4 F (36.9 C), temperature source Oral, resp. rate 13, height 5\' 5"  (1.651 m), weight 68 kg (150 lb), SpO2 99 %. General: A&O x3. Not in acute distress Head: Edema and ecchymosis of the left periorbital area. Eyes: PERRL, EOMI, no scleral icterus. No entrapment or double vision. Ears: Normal auricles and EACs. Nose: Normal mucosa, septum, and turbinates. Mouth: Normal mucosa. No bleeding. Neck: No JVD, no bruit, no mass felt. Trachea midline. No neck lymph node enlargement. Cardiac: S1/S2, RRR, No murmurs, No gallops or rubs. Respiratory: Good air movement bilaterally. No rales, wheezing, rhonchi or rubs. Musculoskeletal: No joint deformities, No joint redness or warmth, no limitation of ROM in spin. Skin: No rashes.  Neuro: Alert, oriented  X3, cranial nerves II-XII grossly intact, moves all extremities normally.   Assessment/Plan: Minimally displaced left maxillary and orbital floor fractures. Mostly asymptomatic at this time. No visual difficulty. Nonoperative at this time. Pt may follow up with me as an outpatient in 1-2 weeks.  Annisten Manchester W Metta Koranda 10/04/2017, 11:43 AM

## 2017-10-05 DIAGNOSIS — I609 Nontraumatic subarachnoid hemorrhage, unspecified: Secondary | ICD-10-CM

## 2017-10-05 LAB — GLUCOSE, CAPILLARY
GLUCOSE-CAPILLARY: 117 mg/dL — AB (ref 65–99)
Glucose-Capillary: 109 mg/dL — ABNORMAL HIGH (ref 65–99)
Glucose-Capillary: 114 mg/dL — ABNORMAL HIGH (ref 65–99)

## 2017-10-05 LAB — BASIC METABOLIC PANEL
ANION GAP: 9 (ref 5–15)
BUN: 30 mg/dL — ABNORMAL HIGH (ref 6–20)
CHLORIDE: 105 mmol/L (ref 101–111)
CO2: 23 mmol/L (ref 22–32)
Calcium: 8.5 mg/dL — ABNORMAL LOW (ref 8.9–10.3)
Creatinine, Ser: 1.68 mg/dL — ABNORMAL HIGH (ref 0.44–1.00)
GFR calc Af Amer: 37 mL/min — ABNORMAL LOW (ref >60)
GFR, EST NON AFRICAN AMERICAN: 32 mL/min — AB (ref >60)
GLUCOSE: 135 mg/dL — AB (ref 65–99)
POTASSIUM: 3.5 mmol/L (ref 3.5–5.1)
Sodium: 137 mmol/L (ref 135–145)

## 2017-10-05 NOTE — Progress Notes (Signed)
Discharge instructions reviewed with pt and husband by RN Lillia AbedLindsay.  Copy of instructions given to pt. No scripts, no new meds--verified with Dr Renford DillsAdhikari Bk that pt did not need keppra for home.  Pt d/c'd via wheelchair with belongings, with husband.         Escorted by hospital volunteer.

## 2017-10-05 NOTE — Progress Notes (Signed)
Inpatient Diabetes Program Recommendations  AACE/ADA: New Consensus Statement on Inpatient Glycemic Control (2015)  Target Ranges:  Prepandial:   less than 140 mg/dL      Peak postprandial:   less than 180 mg/dL (1-2 hours)      Critically ill patients:  140 - 180 mg/dL    Spoke with patient and her husband regarding diabetes and home regimen for diabetes management.  Patient states that she is followed by March RummageAutumn Jones, PA [facility] for diabetes [facility]. Patient uses a Medtronic 630G insulin pump with Novolog insulin as an outpatient. Patient has insulin pump attached and is currently using an insulin pump for inpatient glycemic control. Patient states that she has been having issues with hypoglycemia for several years and her husband has to frequently administer Glucagon to her for severe hypoglycemia. Patient reports that March Rumm[place]umn [place] is aware of issue with hypoglycemia and she continues to make adjustments to her insulin pump. Patient has hypoglycemia unawareness so she is not able to recognize when her glucose is getting low. Inquired about continuous glucose monitoring (CGM) and patient reports that she has talked with Autumn about the Dex Com sensor. Patient states that she could not get the Medtronic 670G insulin pump because Medicare would not cover. Patient would benefit from the 670G insulin pump because it has the ability (with Medtronic CGM) to suspend insulin delivery in the event of hypoglycemia. Encourage patient to talk with Autumn about seeing if she could write a letter to Medicare about her severe hypoglycemia and hospitalizations due to hypoglycemia to see if they would reconsider her getting the 670G pump.   Patient last seen March RummageAutumn Jones, [place] on 09/09/17 and her next appointment is scheduled for March. Encouraged patient to call Endocrinologist office and make an earlier appointment for pump adjustments.  Patient plans to call the office and make appointment to  see Autumn for pump adjustments. Encouraged patient to check her glucose more often to closely monitor glucose to prevent issues with hypoglycemia if possible. Patient verbalized understanding of information discussed and she states that she has no further questions at this time.  Thanks, Orlando PennerMarie Dodd Schmid, RN, MSN, CDE Diabetes Coordinator Inpatient Diabetes Program [phone] (Team Pager from 8am to 5pm)

## 2017-10-05 NOTE — Discharge Summary (Signed)
Physician Discharge Summary  Hannah GellDonna Valentine ZOX:[id number] DOB: 10/14/1955 DOA: 10/03/2017  PCP: Shellia CleverlyBeane, Lori M, PA  Admit date: 10/03/2017 Discharge date: 10/05/2017  Admitted From: Home Disposition:  Home Recommendations for Outpatient Follow-up:  1. Follow up with PCP in 1-2 weeks 2. Please obtain BMP/CBC in one week 3. Please follow up on the following pending results:    Discharge Condition: Stable CODE STATUS: Full Diet recommendation: Carbohydrate consistent  Brief/Interim Summary:  Admission H and P:  Hannah GellDonna Noy is a 61 y.o. 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 Per her husband, pt passed out when she was out out on her driveway going to look at the deer in her yard. Her husband says that she fell face first and hit her head on the concrete. She was unconscious for approximately 5 minutes and upon awakening was confused for ~10 minues.   patient denies any prodrome of symptoms, no urinary unilateral weakness, numbness or tenderness extremities. No facial droop, slurred speech, vision change or hearing loss. Denies any chest pain palpitation. She had bruise and swelling over left orbit and abrasion to theleftside of her face.  Per her husband, she has had similar events due to hypoglycemia in the past.He took a CBG that showed that her blood sugar was 37.He administered 1 unit of glucagon and gave5 g of dextrose.After administration,CBG123 byEMS. Patient denies any nausea vomiting, diarrhea, abdominal pain, symptoms of UTI. Patient states that she has some mild pain.  Hospital Course:  She was found to have SAH and closed fracture of left orbital floor and closed fracture of maxillary sinus.Patient's hospital course remained stable. The syncope and fall was most likely associated with hypoglycemia.  Her blood sugars were  frequently checked during hospitalization. Patient was evaluated by neurosurgery and ENT.  CT head was repeated and was found to be stable. Patient has been cleared by neurosurgery and ENT to be discharged. She will follow-up with ENT in 1-2 weeks. Patient's are to continue to monitor her blood sugars at home.  She is on insulin pump at home. She has history of kidney transplantation and is on mycophenolate, prednisone and tacrolimus.  She should follow-up with a nephrologist as an outpatient.  Her kidney function currently is on baseline.  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 2) Check CBC/BMP testing a week. 3) Follow-up with ENT in 1-2 weeks as an outpatient.  Name and number of the provider has been attached. 4) Monitor your  blood glucose at home.   Increase activity slowly   Complete by:  As directed      Allergies as of 10/05/2017      Reactions   Atorvastatin Other (See Comments)   Muscle weakness   Hydrocodone-acetaminophen Nausea Only   Labetalol  Other (See Comments)   "makes her feel bad"   Meperidine Nausea Only   Pregabalin Other (See Comments)   dizzy      Medication List    TAKE these medications   aspirin EC 81 MG tablet Take 81 mg by mouth daily.   doxazosin 4 MG tablet Commonly known as:  CARDURA Take 4 mg by mouth every evening.   ferrous sulfate 325 (65 FE) MG EC  tablet Take 325 mg by mouth daily.   furosemide 80 MG tablet Commonly known as:  LASIX Take 80 mg by mouth daily.   hydrALAZINE 100 MG tablet Commonly known as:  APRESOLINE Take 100 mg by mouth 3 (three) times daily.   insulin pump Soln Inject into the skin continuous. Novolog   isosorbide mononitrate 60 MG 24 hr tablet Commonly known as:  IMDUR Take 60 mg by mouth 2 (two) times daily.   metoprolol tartrate 25 MG tablet Commonly known as:  LOPRESSOR Take 50 mg by mouth 2 (two) times daily.   mycophenolate 180 MG EC tablet Commonly known as:  MYFORTIC Take 180-360 mg by mouth See admin instructions. Take 2 capsules every morning and take 1 capsule every evening   pravastatin 40 MG tablet Commonly known as:  PRAVACHOL Take 40 mg by mouth every Monday, Wednesday, and Friday.   predniSONE 5 MG tablet Commonly known as:  DELTASONE Take 5 mg by mouth daily.   sulfamethoxazole-trimethoprim 400-80 MG tablet Commonly known as:  BACTRIM,SEPTRA Take 1 tablet by mouth every Monday, Wednesday, and Friday.   tacrolimus 1 MG capsule Commonly known as:  PROGRAF Take 2 mg by mouth 2 (two) times daily. Take with Prograf 0.5 mg to equal 2.5 mg   tacrolimus 0.5 MG capsule Commonly known as:  PROGRAF Take 0.5 mg by mouth 2 (two) times daily. Take with Prograf 1 mg to equal 2.5 mg   Vitamin D 2000 units tablet Take 2,000 Units by mouth daily.       Allergies  Allergen Reactions  . Atorvastatin Other (See Comments)    Muscle weakness  . Hydrocodone-Acetaminophen Nausea Only  . Labetalol Other (See Comments)    "makes her feel bad"  . Meperidine Nausea Only  . Pregabalin Other (See Comments)    dizzy    Consultations:  Neurosurgery/ENT   Procedures/Studies: Dg Chest 2 View  Result Date: 10/03/2017 CLINICAL DATA:  Recent syncopal episode EXAM: CHEST  2 VIEW COMPARISON:  03/14/2015 FINDINGS: Cardiac shadow is enlarged. Aortic calcifications are again seen. The lungs are  well aerated bilaterally without focal infiltrate or sizable effusion. No acute bony abnormality is seen. IMPRESSION: No active cardiopulmonary disease. Electronically Signed   By: Alcide Clever M.D.   On: 10/03/2017 21:20   Ct Head Wo Contrast  Result Date: 10/04/2017 CLINICAL DATA:  Followup intracranial hemorrhage. EXAM: CT HEAD WITHOUT CONTRAST TECHNIQUE: Contiguous axial images were obtained from the base of the skull through the vertex without intravenous contrast. COMPARISON:  10/03/2017 FINDINGS: Brain: Subtle area of subarachnoid hemorrhage within the left parietal region is less conspicuous on today's study, image 18 of series 3. No evidence for progression of hemorrhage. No new areas of hemorrhage identified. No subdural hematoma identified. No evidence for acute brain infarct. No hydrocephalus. Vascular: No hyperdense vessel or unexpected calcification. Skull: Normal. Negative for fracture or focal lesion. Sinuses/Orbits: Left floor of orbit fracture is again identified, image 14 of series 5. Left maxillary sinus fracture is also again noted. Opacification of the left  maxillary sinus is unchanged. Left preseptal hematoma is stable. Other: None IMPRESSION: 1. Stable to improved appearance of left parietal subarachnoid hemorrhage. 2. Fracture of left maxillary sinus and left orbital floor. Electronically Signed   By: Signa Kellaylor  Stroud M.D.   On: 10/04/2017 07:58   Ct Head Wo Contrast  Result Date: 10/03/2017 CLINICAL DATA:  Facial trauma EXAM: CT HEAD WITHOUT CONTRAST CT MAXILLOFACIAL WITHOUT CONTRAST TECHNIQUE: Multidetector CT imaging of the head and maxillofacial structures were performed using the standard protocol without intravenous contrast. Multiplanar CT image reconstructions of the maxillofacial structures were also generated. COMPARISON:  None. FINDINGS: CT HEAD FINDINGS Brain: Ventricle size normal.  Negative for acute infarct or mass Small amount of subarachnoid hemorrhage left parietal  region. No subdural hematoma or midline shift. Vascular: Negative for hyperdense vessel Skull: Left facial fractures described below. Negative for skull fracture. Other: None CT MAXILLOFACIAL FINDINGS Osseous: Fracture of the left maxillary sinus involving the anterior and lateral walls with mild displacement. There is blood in the left maxillary sinus. Mildly displaced fracture of the left orbital floor. Zygomatic arch intact. No other facial fractures. Moderate to advanced degenerative change right TMJ. Orbits: Mildly displaced fracture left orbital floor with bone fragment projecting into the left orbit. Sinuses: Blood in the left maxillary sinus.  Remaining sinuses clear Soft tissues: Soft tissue hematoma lateral to the left orbit IMPRESSION: 1. Small volume subarachnoid hemorrhage left parietal region 2. Fracture of the left maxillary sinus and left orbital floor. Small bone fragment projects into the left orbit. These results were called by telephone at the time of interpretation on 10/03/2017 at 8:58 pm to Dr. Leary RocaMICHAEL MACZIS , who verbally acknowledged these results. Electronically Signed   By: Marlan Palauharles  Clark M.D.   On: 10/03/2017 20:58   Dg Hand 2 View Left  Result Date: 10/04/2017 CLINICAL DATA:  Status post fall, with left hand pain. Initial encounter. EXAM: LEFT HAND - 2 VIEW COMPARISON:  None. FINDINGS: There is no evidence of fracture or dislocation. The joint spaces are preserved. The carpal rows are intact, and demonstrate normal alignment. The soft tissues are unremarkable in appearance. Postoperative change is noted about the wrist. IMPRESSION: No evidence of fracture or dislocation. Electronically Signed   By: Roanna RaiderJeffery  Chang M.D.   On: 10/04/2017 00:58   Ct Maxillofacial Wo Cm  Result Date: 10/03/2017 CLINICAL DATA:  Facial trauma EXAM: CT HEAD WITHOUT CONTRAST CT MAXILLOFACIAL WITHOUT CONTRAST TECHNIQUE: Multidetector CT imaging of the head and maxillofacial structures were performed  using the standard protocol without intravenous contrast. Multiplanar CT image reconstructions of the maxillofacial structures were also generated. COMPARISON:  None. FINDINGS: CT HEAD FINDINGS Brain: Ventricle size normal.  Negative for acute infarct or mass Small amount of subarachnoid hemorrhage left parietal region. No subdural hematoma or midline shift. Vascular: Negative for hyperdense vessel Skull: Left facial fractures described below. Negative for skull fracture. Other: None CT MAXILLOFACIAL FINDINGS Osseous: Fracture of the left maxillary sinus involving the anterior and lateral walls with mild displacement. There is blood in the left maxillary sinus. Mildly displaced fracture of the left orbital floor. Zygomatic arch intact. No other facial fractures. Moderate to advanced degenerative change right TMJ. Orbits: Mildly displaced fracture left orbital floor with bone fragment projecting into the left orbit. Sinuses: Blood in the left maxillary sinus.  Remaining sinuses clear Soft tissues: Soft tissue hematoma lateral to the left orbit IMPRESSION: 1. Small volume subarachnoid hemorrhage left parietal region 2. Fracture of the left maxillary sinus and left  orbital floor. Small bone fragment projects into the left orbit. These results were called by telephone at the time of interpretation on 10/03/2017 at 8:58 pm to Dr. Leary Roca , who verbally acknowledged these results. Electronically Signed   By: Marlan Palau M.D.   On: 10/03/2017 20:58    (Echo, Carotid, EGD, Colonoscopy, ERCP)    Subjective:   Discharge Exam: Vitals:   10/05/17 0459 10/05/17 0832  BP: (!) 141/55   Pulse: (!) 52 (!) 56  Resp: 16   Temp: 97.9 F (36.6 C)   SpO2: 97%    Vitals:   10/04/17 1507 10/04/17 2045 10/05/17 0459 10/05/17 0832  BP: (!) 152/83 (!) 155/52 (!) 141/55   Pulse: (!) 58 63 (!) 52 (!) 56  Resp: 14 16 16    Temp: 98.2 F (36.8 C) 98.2 F (36.8 C) 97.9 F (36.6 C)   TempSrc: Oral Oral Oral    SpO2: 99% 97% 97%   Weight: 93.4 kg (205 lb 14.6 oz)     Height: 5\' 7"  (1.702 m)       General: Pt is alert, awake, not in acute distress HEENT: Bruise on the left face with swelling of left periorbital area. Cardiovascular: RRR, S1/S2 +, no rubs, no gallops Respiratory: CTA bilaterally, no wheezing, no rhonchi Abdominal: Soft, NT, ND, bowel sounds  Extremities: no edema, no cyanosis    The results of significant diagnostics from this hospitalization (including imaging, microbiology, ancillary and laboratory) are listed below for reference.     Microbiology: No results found for this or any previous visit (from the past 240 hour(s)).   Labs: BNP (last 3 results) Recent Labs    10/04/17 0245  BNP 379.4*   Basic Metabolic Panel: Recent Labs  Lab 10/03/17 1950 10/04/17 0400 10/05/17 0908  NA 139 133* 137  K 3.5 3.3* 3.5  CL 106 103 105  CO2 23 18* 23  GLUCOSE 102* 177* 135*  BUN 45* 46* 30*  CREATININE 1.70* 1.68* 1.68*  CALCIUM 8.8* 8.2* 8.5*   Liver Function Tests: Recent Labs  Lab 10/03/17 1950  AST 33  ALT 20  ALKPHOS 62  BILITOT 0.7  PROT 7.2  ALBUMIN 3.9   No results for input(s): LIPASE, AMYLASE in the last 168 hours. No results for input(s): AMMONIA in the last 168 hours. CBC: Recent Labs  Lab 10/03/17 1950 10/04/17 0400  WBC 7.3 6.7  NEUTROABS 5.8  --   HGB 14.0 12.9  HCT 43.3 40.3  MCV 96.0 96.4  PLT 175 166   Cardiac Enzymes: No results for input(s): CKTOTAL, CKMB, CKMBINDEX, TROPONINI in the last 168 hours. BNP: Invalid input(s): POCBNP CBG: Recent Labs  Lab 10/04/17 1519 10/04/17 1759 10/04/17 2043 10/05/17 0207 10/05/17 0735  GLUCAP 52* 93 146* 109* 117*   D-Dimer No results for input(s): DDIMER in the last 72 hours. Hgb A1c No results for input(s): HGBA1C in the last 72 hours. Lipid Profile No results for input(s): CHOL, HDL, LDLCALC, TRIG, CHOLHDL, LDLDIRECT in the last 72 hours. Thyroid function studies No results  for input(s): TSH, T4TOTAL, T3FREE, THYROIDAB in the last 72 hours.  Invalid input(s): FREET3 Anemia work up No results for input(s): VITAMINB12, FOLATE, FERRITIN, TIBC, IRON, RETICCTPCT in the last 72 hours. Urinalysis    Component Value Date/Time   COLORURINE YELLOW 10/04/2017 0300   APPEARANCEUR CLEAR 10/04/2017 0300   LABSPEC 1.015 10/04/2017 0300   PHURINE 5.0 10/04/2017 0300   GLUCOSEU NEGATIVE 10/04/2017 0300   HGBUR NEGATIVE  10/04/2017 0300   BILIRUBINUR NEGATIVE 10/04/2017 0300   KETONESUR 5 (A) 10/04/2017 0300   PROTEINUR NEGATIVE 10/04/2017 0300   NITRITE NEGATIVE 10/04/2017 0300   LEUKOCYTESUR NEGATIVE 10/04/2017 0300   Sepsis Labs Invalid input(s): PROCALCITONIN,  WBC,  LACTICIDVEN Microbiology No results found for this or any previous visit (from the past 240 hour(s)).   Time coordinating discharge: Over 30 minutes  SIGNED:   Meredith Leeds, MD  [facility] 10/05/2017, 10:06 AM Pager [phone]  If 7PM-7AM, please contact night-coverage [email or web address] Password [email or web address]

## 2017-10-05 NOTE — Progress Notes (Signed)
Pt with no active Trauma Surgery issues. Pt being managed by NSR and facial trauma. Please call if can be of further assistance.

## 2017-10-07 LAB — TACROLIMUS LEVEL: TACROLIMUS (FK506) - LABCORP: 4.8 ng/mL (ref 2.0–20.0)

## 2017-10-15 ENCOUNTER — Ambulatory Visit (INDEPENDENT_AMBULATORY_CARE_PROVIDER_SITE_OTHER): Payer: Medicare Other | Admitting: Otolaryngology

## 2017-10-15 DIAGNOSIS — S0232XA Fracture of orbital floor, left side, initial encounter for closed fracture: Secondary | ICD-10-CM | POA: Diagnosis not present

## 2017-10-15 DIAGNOSIS — S02401A Maxillary fracture, unspecified, initial encounter for closed fracture: Secondary | ICD-10-CM

## 2018-03-11 ENCOUNTER — Emergency Department (HOSPITAL_COMMUNITY): Payer: Medicare Other

## 2018-03-11 ENCOUNTER — Inpatient Hospital Stay (HOSPITAL_COMMUNITY)
Admission: EM | Admit: 2018-03-11 | Discharge: 2018-03-22 | DRG: 493 | Disposition: A | Discharge status: Rehabilitation Facility | Payer: Medicare Other | Attending: Internal Medicine | Admitting: Internal Medicine

## 2018-03-11 ENCOUNTER — Other Ambulatory Visit: Payer: Self-pay

## 2018-03-11 ENCOUNTER — Encounter (HOSPITAL_COMMUNITY): Payer: Self-pay

## 2018-03-11 DIAGNOSIS — D638 Anemia in other chronic diseases classified elsewhere: Secondary | ICD-10-CM | POA: Diagnosis not present

## 2018-03-11 DIAGNOSIS — E10649 Type 1 diabetes mellitus with hypoglycemia without coma: Secondary | ICD-10-CM | POA: Diagnosis present

## 2018-03-11 DIAGNOSIS — E876 Hypokalemia: Secondary | ICD-10-CM | POA: Diagnosis not present

## 2018-03-11 DIAGNOSIS — S82201S Unspecified fracture of shaft of right tibia, sequela: Secondary | ICD-10-CM | POA: Diagnosis not present

## 2018-03-11 DIAGNOSIS — M25531 Pain in right wrist: Secondary | ICD-10-CM | POA: Diagnosis not present

## 2018-03-11 DIAGNOSIS — E1122 Type 2 diabetes mellitus with diabetic chronic kidney disease: Secondary | ICD-10-CM | POA: Diagnosis present

## 2018-03-11 DIAGNOSIS — S82401S Unspecified fracture of shaft of right fibula, sequela: Secondary | ICD-10-CM | POA: Diagnosis not present

## 2018-03-11 DIAGNOSIS — N183 Chronic kidney disease, stage 3 unspecified: Secondary | ICD-10-CM | POA: Diagnosis present

## 2018-03-11 DIAGNOSIS — R5383 Other fatigue: Secondary | ICD-10-CM | POA: Diagnosis not present

## 2018-03-11 DIAGNOSIS — B259 Cytomegaloviral disease, unspecified: Secondary | ICD-10-CM | POA: Diagnosis present

## 2018-03-11 DIAGNOSIS — E1021 Type 1 diabetes mellitus with diabetic nephropathy: Secondary | ICD-10-CM | POA: Diagnosis present

## 2018-03-11 DIAGNOSIS — Z7982 Long term (current) use of aspirin: Secondary | ICD-10-CM

## 2018-03-11 DIAGNOSIS — D62 Acute posthemorrhagic anemia: Secondary | ICD-10-CM | POA: Diagnosis not present

## 2018-03-11 DIAGNOSIS — E1010 Type 1 diabetes mellitus with ketoacidosis without coma: Secondary | ICD-10-CM | POA: Diagnosis not present

## 2018-03-11 DIAGNOSIS — L03116 Cellulitis of left lower limb: Secondary | ICD-10-CM | POA: Diagnosis present

## 2018-03-11 DIAGNOSIS — I129 Hypertensive chronic kidney disease with stage 1 through stage 4 chronic kidney disease, or unspecified chronic kidney disease: Secondary | ICD-10-CM | POA: Diagnosis present

## 2018-03-11 DIAGNOSIS — K5900 Constipation, unspecified: Secondary | ICD-10-CM | POA: Diagnosis not present

## 2018-03-11 DIAGNOSIS — S63501D Unspecified sprain of right wrist, subsequent encounter: Secondary | ICD-10-CM | POA: Diagnosis not present

## 2018-03-11 DIAGNOSIS — L89899 Pressure ulcer of other site, unspecified stage: Secondary | ICD-10-CM | POA: Diagnosis not present

## 2018-03-11 DIAGNOSIS — R609 Edema, unspecified: Secondary | ICD-10-CM | POA: Diagnosis not present

## 2018-03-11 DIAGNOSIS — Z833 Family history of diabetes mellitus: Secondary | ICD-10-CM | POA: Diagnosis not present

## 2018-03-11 DIAGNOSIS — S82102D Unspecified fracture of upper end of left tibia, subsequent encounter for closed fracture with routine healing: Secondary | ICD-10-CM | POA: Diagnosis present

## 2018-03-11 DIAGNOSIS — E669 Obesity, unspecified: Secondary | ICD-10-CM | POA: Diagnosis present

## 2018-03-11 DIAGNOSIS — I5030 Unspecified diastolic (congestive) heart failure: Secondary | ICD-10-CM | POA: Diagnosis not present

## 2018-03-11 DIAGNOSIS — I169 Hypertensive crisis, unspecified: Secondary | ICD-10-CM | POA: Diagnosis present

## 2018-03-11 DIAGNOSIS — T8619 Other complication of kidney transplant: Secondary | ICD-10-CM | POA: Diagnosis present

## 2018-03-11 DIAGNOSIS — Z7952 Long term (current) use of systemic steroids: Secondary | ICD-10-CM | POA: Diagnosis not present

## 2018-03-11 DIAGNOSIS — Z940 Kidney transplant status: Secondary | ICD-10-CM

## 2018-03-11 DIAGNOSIS — R7303 Prediabetes: Secondary | ICD-10-CM | POA: Diagnosis not present

## 2018-03-11 DIAGNOSIS — S82202A Unspecified fracture of shaft of left tibia, initial encounter for closed fracture: Principal | ICD-10-CM

## 2018-03-11 DIAGNOSIS — R8271 Bacteriuria: Secondary | ICD-10-CM | POA: Diagnosis not present

## 2018-03-11 DIAGNOSIS — R5381 Other malaise: Secondary | ICD-10-CM | POA: Diagnosis not present

## 2018-03-11 DIAGNOSIS — Z801 Family history of malignant neoplasm of trachea, bronchus and lung: Secondary | ICD-10-CM | POA: Diagnosis not present

## 2018-03-11 DIAGNOSIS — E1121 Type 2 diabetes mellitus with diabetic nephropathy: Secondary | ICD-10-CM | POA: Diagnosis not present

## 2018-03-11 DIAGNOSIS — S82192A Other fracture of upper end of left tibia, initial encounter for closed fracture: Secondary | ICD-10-CM

## 2018-03-11 DIAGNOSIS — B258 Other cytomegaloviral diseases: Secondary | ICD-10-CM | POA: Diagnosis not present

## 2018-03-11 DIAGNOSIS — S82102S Unspecified fracture of upper end of left tibia, sequela: Secondary | ICD-10-CM | POA: Diagnosis not present

## 2018-03-11 DIAGNOSIS — D709 Neutropenia, unspecified: Secondary | ICD-10-CM | POA: Diagnosis not present

## 2018-03-11 DIAGNOSIS — W19XXXA Unspecified fall, initial encounter: Secondary | ICD-10-CM | POA: Diagnosis present

## 2018-03-11 DIAGNOSIS — S82401D Unspecified fracture of shaft of right fibula, subsequent encounter for closed fracture with routine healing: Secondary | ICD-10-CM | POA: Diagnosis not present

## 2018-03-11 DIAGNOSIS — T501X5A Adverse effect of loop [high-ceiling] diuretics, initial encounter: Secondary | ICD-10-CM | POA: Diagnosis not present

## 2018-03-11 DIAGNOSIS — R509 Fever, unspecified: Secondary | ICD-10-CM | POA: Diagnosis not present

## 2018-03-11 DIAGNOSIS — M810 Age-related osteoporosis without current pathological fracture: Secondary | ICD-10-CM | POA: Diagnosis present

## 2018-03-11 DIAGNOSIS — M79609 Pain in unspecified limb: Secondary | ICD-10-CM | POA: Diagnosis not present

## 2018-03-11 DIAGNOSIS — Z5181 Encounter for therapeutic drug level monitoring: Secondary | ICD-10-CM | POA: Diagnosis not present

## 2018-03-11 DIAGNOSIS — I10 Essential (primary) hypertension: Secondary | ICD-10-CM | POA: Diagnosis present

## 2018-03-11 DIAGNOSIS — E1169 Type 2 diabetes mellitus with other specified complication: Secondary | ICD-10-CM | POA: Diagnosis not present

## 2018-03-11 DIAGNOSIS — S82409S Unspecified fracture of shaft of unspecified fibula, sequela: Secondary | ICD-10-CM | POA: Diagnosis not present

## 2018-03-11 DIAGNOSIS — T402X5A Adverse effect of other opioids, initial encounter: Secondary | ICD-10-CM | POA: Diagnosis not present

## 2018-03-11 DIAGNOSIS — L97128 Non-pressure chronic ulcer of left thigh with other specified severity: Secondary | ICD-10-CM | POA: Diagnosis not present

## 2018-03-11 DIAGNOSIS — T148XXA Other injury of unspecified body region, initial encounter: Secondary | ICD-10-CM

## 2018-03-11 DIAGNOSIS — Z79899 Other long term (current) drug therapy: Secondary | ICD-10-CM

## 2018-03-11 DIAGNOSIS — Z885 Allergy status to narcotic agent status: Secondary | ICD-10-CM

## 2018-03-11 DIAGNOSIS — Z823 Family history of stroke: Secondary | ICD-10-CM | POA: Diagnosis not present

## 2018-03-11 DIAGNOSIS — M7989 Other specified soft tissue disorders: Secondary | ICD-10-CM | POA: Diagnosis not present

## 2018-03-11 DIAGNOSIS — S82832A Other fracture of upper and lower end of left fibula, initial encounter for closed fracture: Secondary | ICD-10-CM | POA: Diagnosis present

## 2018-03-11 DIAGNOSIS — E8770 Fluid overload, unspecified: Secondary | ICD-10-CM | POA: Diagnosis not present

## 2018-03-11 DIAGNOSIS — S82402D Unspecified fracture of shaft of left fibula, subsequent encounter for closed fracture with routine healing: Secondary | ICD-10-CM | POA: Diagnosis not present

## 2018-03-11 DIAGNOSIS — Z8619 Personal history of other infectious and parasitic diseases: Secondary | ICD-10-CM | POA: Diagnosis not present

## 2018-03-11 DIAGNOSIS — E11649 Type 2 diabetes mellitus with hypoglycemia without coma: Secondary | ICD-10-CM | POA: Diagnosis not present

## 2018-03-11 DIAGNOSIS — E1022 Type 1 diabetes mellitus with diabetic chronic kidney disease: Secondary | ICD-10-CM | POA: Diagnosis present

## 2018-03-11 DIAGNOSIS — S82102A Unspecified fracture of upper end of left tibia, initial encounter for closed fracture: Secondary | ICD-10-CM | POA: Diagnosis not present

## 2018-03-11 DIAGNOSIS — Y93K1 Activity, walking an animal: Secondary | ICD-10-CM | POA: Diagnosis not present

## 2018-03-11 DIAGNOSIS — E871 Hypo-osmolality and hyponatremia: Secondary | ICD-10-CM | POA: Diagnosis not present

## 2018-03-11 DIAGNOSIS — T1490XA Injury, unspecified, initial encounter: Secondary | ICD-10-CM | POA: Diagnosis not present

## 2018-03-11 DIAGNOSIS — Z8249 Family history of ischemic heart disease and other diseases of the circulatory system: Secondary | ICD-10-CM | POA: Diagnosis not present

## 2018-03-11 DIAGNOSIS — Z6832 Body mass index (BMI) 32.0-32.9, adult: Secondary | ICD-10-CM

## 2018-03-11 DIAGNOSIS — E109 Type 1 diabetes mellitus without complications: Secondary | ICD-10-CM

## 2018-03-11 DIAGNOSIS — Z888 Allergy status to other drugs, medicaments and biological substances status: Secondary | ICD-10-CM

## 2018-03-11 DIAGNOSIS — W010XXA Fall on same level from slipping, tripping and stumbling without subsequent striking against object, initial encounter: Secondary | ICD-10-CM | POA: Diagnosis present

## 2018-03-11 DIAGNOSIS — T8149XA Infection following a procedure, other surgical site, initial encounter: Secondary | ICD-10-CM | POA: Diagnosis not present

## 2018-03-11 DIAGNOSIS — Z803 Family history of malignant neoplasm of breast: Secondary | ICD-10-CM | POA: Diagnosis not present

## 2018-03-11 DIAGNOSIS — S82201D Unspecified fracture of shaft of right tibia, subsequent encounter for closed fracture with routine healing: Secondary | ICD-10-CM | POA: Diagnosis not present

## 2018-03-11 DIAGNOSIS — E785 Hyperlipidemia, unspecified: Secondary | ICD-10-CM | POA: Diagnosis present

## 2018-03-11 DIAGNOSIS — R5081 Fever presenting with conditions classified elsewhere: Secondary | ICD-10-CM | POA: Diagnosis not present

## 2018-03-11 DIAGNOSIS — Z87891 Personal history of nicotine dependence: Secondary | ICD-10-CM | POA: Diagnosis not present

## 2018-03-11 DIAGNOSIS — L8990 Pressure ulcer of unspecified site, unspecified stage: Secondary | ICD-10-CM

## 2018-03-11 DIAGNOSIS — S82209S Unspecified fracture of shaft of unspecified tibia, sequela: Secondary | ICD-10-CM | POA: Diagnosis not present

## 2018-03-11 DIAGNOSIS — R011 Cardiac murmur, unspecified: Secondary | ICD-10-CM | POA: Diagnosis not present

## 2018-03-11 DIAGNOSIS — Z9641 Presence of insulin pump (external) (internal): Secondary | ICD-10-CM | POA: Diagnosis present

## 2018-03-11 DIAGNOSIS — W19XXXD Unspecified fall, subsequent encounter: Secondary | ICD-10-CM | POA: Diagnosis present

## 2018-03-11 DIAGNOSIS — Z794 Long term (current) use of insulin: Secondary | ICD-10-CM | POA: Diagnosis not present

## 2018-03-11 DIAGNOSIS — K5903 Drug induced constipation: Secondary | ICD-10-CM | POA: Diagnosis not present

## 2018-03-11 HISTORY — DX: Other specified postprocedural states: R11.2

## 2018-03-11 HISTORY — DX: Adverse effect of unspecified anesthetic, initial encounter: T41.45XA

## 2018-03-11 HISTORY — DX: Other complications of anesthesia, initial encounter: T88.59XA

## 2018-03-11 HISTORY — DX: Nausea with vomiting, unspecified: Z98.890

## 2018-03-11 LAB — PROTIME-INR
INR: 1.08
PROTHROMBIN TIME: 13.9 s (ref 11.4–15.2)

## 2018-03-11 LAB — CBC WITH DIFFERENTIAL/PLATELET
Abs Immature Granulocytes: 0 10*3/uL (ref 0.0–0.1)
BASOS ABS: 0.1 10*3/uL (ref 0.0–0.1)
BASOS PCT: 1 %
Eosinophils Absolute: 0.2 10*3/uL (ref 0.0–0.7)
Eosinophils Relative: 2 %
HCT: 43.9 % (ref 36.0–46.0)
HEMOGLOBIN: 14 g/dL (ref 12.0–15.0)
IMMATURE GRANULOCYTES: 1 %
LYMPHS PCT: 14 %
Lymphs Abs: 1 10*3/uL (ref 0.7–4.0)
MCH: 30.8 pg (ref 26.0–34.0)
MCHC: 31.9 g/dL (ref 30.0–36.0)
MCV: 96.7 fL (ref 78.0–100.0)
MONO ABS: 0.5 10*3/uL (ref 0.1–1.0)
MONOS PCT: 6 %
NEUTROS ABS: 5.5 10*3/uL (ref 1.7–7.7)
NEUTROS PCT: 76 %
PLATELETS: 182 10*3/uL (ref 150–400)
RBC: 4.54 MIL/uL (ref 3.87–5.11)
RDW: 14.5 % (ref 11.5–15.5)
WBC: 7.2 10*3/uL (ref 4.0–10.5)

## 2018-03-11 LAB — URINALYSIS, ROUTINE W REFLEX MICROSCOPIC
BILIRUBIN URINE: NEGATIVE
Glucose, UA: NEGATIVE mg/dL
Hgb urine dipstick: NEGATIVE
KETONES UR: NEGATIVE mg/dL
LEUKOCYTES UA: NEGATIVE
NITRITE: NEGATIVE
PROTEIN: NEGATIVE mg/dL
Specific Gravity, Urine: 1.009 (ref 1.005–1.030)
pH: 5 (ref 5.0–8.0)

## 2018-03-11 LAB — BASIC METABOLIC PANEL
Anion gap: 9 (ref 5–15)
BUN: 36 mg/dL — AB (ref 6–20)
CHLORIDE: 109 mmol/L (ref 101–111)
CO2: 24 mmol/L (ref 22–32)
Calcium: 8.5 mg/dL — ABNORMAL LOW (ref 8.9–10.3)
Creatinine, Ser: 1.84 mg/dL — ABNORMAL HIGH (ref 0.44–1.00)
GFR calc Af Amer: 33 mL/min — ABNORMAL LOW (ref >60)
GFR, EST NON AFRICAN AMERICAN: 28 mL/min — AB (ref >60)
Glucose, Bld: 89 mg/dL (ref 65–99)
POTASSIUM: 3.7 mmol/L (ref 3.5–5.1)
SODIUM: 142 mmol/L (ref 135–145)

## 2018-03-11 LAB — TROPONIN I: Troponin I: <0.03 ng/mL (ref <0.03)

## 2018-03-11 LAB — GLUCOSE, CAPILLARY
GLUCOSE-CAPILLARY: 81 mg/dL (ref 65–99)
GLUCOSE-CAPILLARY: 95 mg/dL (ref 65–99)

## 2018-03-11 LAB — MRSA PCR SCREENING: MRSA BY PCR: NEGATIVE

## 2018-03-11 LAB — HEMOGLOBIN A1C
HEMOGLOBIN A1C: 5.8 % — AB (ref 4.8–5.6)
MEAN PLASMA GLUCOSE: 119.76 mg/dL

## 2018-03-11 LAB — ABO/RH: ABO/RH(D): B NEG

## 2018-03-11 MED ORDER — AMLODIPINE BESYLATE 5 MG PO TABS: 5.0000 mg | ORAL_TABLET | Freq: Every day | ORAL | Status: DC

## 2018-03-11 MED ORDER — DOXAZOSIN MESYLATE 4 MG PO TABS
4.0000 mg | ORAL_TABLET | Freq: Every evening | ORAL | Status: DC
  Administered 2018-03-11: 4 mg via ORAL
  Filled 2018-03-11 (×3): qty 1

## 2018-03-11 MED ORDER — HYDROCODONE-ACETAMINOPHEN 5-325 MG PO TABS
1.0000 | ORAL_TABLET | Freq: Four times a day (QID) | ORAL | Status: DC | PRN
  Administered 2018-03-13 – 2018-03-16 (×6): 2 via ORAL
  Administered 2018-03-16: 1 via ORAL
  Filled 2018-03-11 (×6): qty 2
  Filled 2018-03-11: qty 1

## 2018-03-11 MED ORDER — ISOSORBIDE MONONITRATE ER 60 MG PO TB24
60.0000 mg | ORAL_TABLET | Freq: Two times a day (BID) | ORAL | Status: DC
  Administered 2018-03-11: 60 mg via ORAL
  Filled 2018-03-11: qty 2

## 2018-03-11 MED ORDER — MYCOPHENOLATE SODIUM 180 MG PO TBEC
360.0000 mg | DELAYED_RELEASE_TABLET | Freq: Every day | ORAL | Status: DC
  Administered 2018-03-12 – 2018-03-19 (×8): 360 mg via ORAL
  Filled 2018-03-11 (×9): qty 2

## 2018-03-11 MED ORDER — PRAVASTATIN SODIUM 40 MG PO TABS
40.0000 mg | ORAL_TABLET | Freq: Every day | ORAL | Status: DC
  Administered 2018-03-11 – 2018-03-21 (×11): 40 mg via ORAL
  Filled 2018-03-11 (×11): qty 1

## 2018-03-11 MED ORDER — MYCOPHENOLATE SODIUM 180 MG PO TBEC: 180.0000 mg | DELAYED_RELEASE_TABLET | ORAL | Status: DC

## 2018-03-11 MED ORDER — CHLORHEXIDINE GLUCONATE 4 % EX LIQD
60.0000 mL | Freq: Once | CUTANEOUS | Status: AC
  Administered 2018-03-12: 4 via TOPICAL
  Filled 2018-03-11: qty 60

## 2018-03-11 MED ORDER — TACROLIMUS 1 MG PO CAPS
2.0000 mg | ORAL_CAPSULE | Freq: Two times a day (BID) | ORAL | Status: DC
  Administered 2018-03-11 – 2018-03-22 (×22): 2 mg via ORAL
  Filled 2018-03-11 (×25): qty 2

## 2018-03-11 MED ORDER — CEFAZOLIN SODIUM-DEXTROSE 2-4 GM/100ML-% IV SOLN
2.0000 g | INTRAVENOUS | Status: AC
  Administered 2018-03-12: 2 g via INTRAVENOUS
  Filled 2018-03-11: qty 100

## 2018-03-11 MED ORDER — SULFAMETHOXAZOLE-TRIMETHOPRIM 400-80 MG PO TABS
1.0000 | ORAL_TABLET | ORAL | Status: DC
  Administered 2018-03-15 – 2018-03-22 (×4): 1 via ORAL
  Filled 2018-03-11 (×5): qty 1

## 2018-03-11 MED ORDER — OXYCODONE HCL 5 MG PO TABS
10.0000 mg | ORAL_TABLET | ORAL | Status: DC | PRN
  Administered 2018-03-11: 20 mg via ORAL
  Administered 2018-03-11: 10 mg via ORAL
  Administered 2018-03-12: 20 mg via ORAL
  Administered 2018-03-12: 15 mg via ORAL
  Administered 2018-03-12 – 2018-03-13 (×5): 20 mg via ORAL
  Administered 2018-03-14 – 2018-03-21 (×4): 10 mg via ORAL
  Filled 2018-03-11 (×2): qty 4
  Filled 2018-03-11 (×2): qty 2
  Filled 2018-03-11 (×2): qty 4
  Filled 2018-03-11 (×3): qty 2
  Filled 2018-03-11: qty 3
  Filled 2018-03-11: qty 2
  Filled 2018-03-11 (×3): qty 4

## 2018-03-11 MED ORDER — SODIUM CHLORIDE 0.9 % IV SOLN
INTRAVENOUS | Status: DC
  Administered 2018-03-11: 16:00:00 via INTRAVENOUS

## 2018-03-11 MED ORDER — FUROSEMIDE 40 MG PO TABS: 80.0000 mg | ORAL_TABLET | Freq: Every day | ORAL | Status: DC

## 2018-03-11 MED ORDER — HYDRALAZINE HCL 50 MG PO TABS
100.0000 mg | ORAL_TABLET | Freq: Three times a day (TID) | ORAL | Status: DC
  Administered 2018-03-11 – 2018-03-12 (×2): 100 mg via ORAL
  Filled 2018-03-11 (×2): qty 2

## 2018-03-11 MED ORDER — FENTANYL CITRATE (PF) 100 MCG/2ML IJ SOLN
50.0000 ug | INTRAMUSCULAR | Status: DC | PRN
  Administered 2018-03-11 (×7): 50 ug via INTRAVENOUS
  Filled 2018-03-11 (×7): qty 2

## 2018-03-11 MED ORDER — METOPROLOL TARTRATE 50 MG PO TABS
50.0000 mg | ORAL_TABLET | Freq: Two times a day (BID) | ORAL | Status: DC
  Administered 2018-03-11 – 2018-03-22 (×22): 50 mg via ORAL
  Filled 2018-03-11 (×2): qty 1
  Filled 2018-03-11: qty 2
  Filled 2018-03-11 (×15): qty 1
  Filled 2018-03-11: qty 2
  Filled 2018-03-11 (×3): qty 1

## 2018-03-11 MED ORDER — PREDNISONE 5 MG PO TABS
5.0000 mg | ORAL_TABLET | Freq: Every day | ORAL | Status: DC
  Administered 2018-03-13 – 2018-03-22 (×10): 5 mg via ORAL
  Filled 2018-03-11 (×10): qty 1

## 2018-03-11 MED ORDER — MYCOPHENOLATE SODIUM 180 MG PO TBEC
180.0000 mg | DELAYED_RELEASE_TABLET | Freq: Every evening | ORAL | Status: DC
  Administered 2018-03-11 – 2018-03-18 (×8): 180 mg via ORAL
  Filled 2018-03-11 (×8): qty 1

## 2018-03-11 MED ORDER — POVIDONE-IODINE 10 % EX SWAB: 2.0000 "application " | Freq: Once | CUTANEOUS | Status: DC

## 2018-03-11 MED ORDER — INSULIN GLARGINE 100 UNIT/ML ~~LOC~~ SOLN
10.0000 [IU] | Freq: Every day | SUBCUTANEOUS | Status: DC
  Filled 2018-03-11: qty 0.1

## 2018-03-11 MED ORDER — INSULIN PUMP: SUBCUTANEOUS | Status: DC

## 2018-03-11 MED ORDER — INSULIN ASPART 100 UNIT/ML ~~LOC~~ SOLN: 0.0000 [IU] | Freq: Three times a day (TID) | SUBCUTANEOUS | Status: DC

## 2018-03-11 MED ORDER — FERROUS SULFATE 325 (65 FE) MG PO TABS
325.0000 mg | ORAL_TABLET | Freq: Every day | ORAL | Status: DC
  Administered 2018-03-13 – 2018-03-22 (×10): 325 mg via ORAL
  Filled 2018-03-11 (×10): qty 1

## 2018-03-11 MED ORDER — POLYETHYLENE GLYCOL 3350 17 G PO PACK
17.0000 g | PACK | Freq: Every day | ORAL | Status: DC | PRN
Start: 2018-03-11 — End: 2018-03-22
  Administered 2018-03-14: 17 g via ORAL
  Filled 2018-03-11: qty 1

## 2018-03-11 NOTE — Progress Notes (Signed)
Patient has own insulin pump. New order received to d/c insulin pump and start on novolog SS. Patient was notified of changes and she stated that she didn't want insulin pump to be d/c'd because she has better BS control with her pump VS SS, and we will have to check her BS q 2 hrs if we make the changes. MD was notified and state to call Diabetic coordinator.  Paged Diabetic coordinator at [phone], no return phone call at this time. Reported to oncoming nurse to f/u.

## 2018-03-11 NOTE — ED Notes (Signed)
Orthopedic Provider at bedside. °

## 2018-03-11 NOTE — ED Provider Notes (Signed)
[facility] EMERGENCY DEPARTMENT Provider Note   CSN: [id number] Arrival date & time: 03/11/18  0945     History   Chief Complaint Chief Complaint  Patient presents with  . Leg Injury    HPI Hannah Valentine is a 62 y.o. female.  HPI  62 year old female, she has a known history of chronic kidney disease, she has been on dialysis until she received a kidney transplant.  She no longer gets dialysis, she has done very well, she is on her antirejection medications.  She also is known to have a diabetes as well as high blood pressure.  The patient presents after she was walking her dog today complaining of an acute left knee injury.  This acute occurred in onset, it happened just prior to arrival, she states that her pit bull who she recently adopted chased after a squirrel which pulled her into a tree, she struck the left side of her leg into the tree and fell to the ground.  She felt like the leg had an acute snap, she had a deformity, she could not stand or walk, she had her phone and called for transport.  The patient states she is up-to-date on tetanus within the last 5 years.  Her symptoms are severe, she cannot move the leg, she has no numbness no leg, the paramedics immobilized the leg with wrapped blanket.  They gave 200 mcg of fentanyl with some relief.  The patient denies head injury or neck pain.  Past Medical History:  Diagnosis Date  . CKD (chronic kidney disease), stage III (HCC)   . Essential hypertension   . HLD (hyperlipidemia)   . Kidney transplanted 2017  . Type II diabetes mellitus with renal manifestations [facility])     Patient Active Problem List   Diagnosis Date Noted  . Left tibial fracture 03/11/2018  . Hypoglycemia 10/04/2017  . Fall 10/04/2017  . SAH (subarachnoid hemorrhage) (HCC) 10/04/2017  . DM (diabetes mellitus), type 1 (HCC) 10/04/2017  . Essential hypertension   . HLD (hyperlipidemia)   . CKD (chronic kidney disease), stage III (HCC)    . Closed fracture of left orbital floor (HCC)   . Closed fracture of maxillary sinus (HCC)   . Syncope   . History of kidney transplant 10/07/2015    Past Surgical History:  Procedure Laterality Date  . NEPHRECTOMY TRANSPLANTED ORGAN       OB History   None      Home Medications    Prior to Admission medications   Medication Sig Start Date End Date Taking? Authorizing Provider  amLODipine (NORVASC) 5 MG tablet Take 5 mg by mouth daily. 02/26/18  Yes [provider]  aspirin EC 81 MG tablet Take 81 mg by mouth at bedtime.    Yes [provider]  Cholecalciferol (VITAMIN D) 2000 units tablet Take 5,000 Units by mouth daily.  09/24/17  Yes [provider]  doxazosin (CARDURA) 4 MG tablet Take 4 mg by mouth every evening. 07/24/17  Yes [provider]  ferrous sulfate 325 (65 FE) MG EC tablet Take 325 mg by mouth daily.   Yes [provider]  furosemide (LASIX) 80 MG tablet Take 80 mg by mouth daily. 07/08/17  Yes [provider]  hydrALAZINE (APRESOLINE) 100 MG tablet Take 100 mg by mouth 3 (three) times daily. 07/24/17  Yes [provider]  Insulin Human (INSULIN PUMP) SOLN Inject into the skin continuous. Novolog   Yes [provider]  isosorbide mononitrate (IMDUR) 60 MG 24 hr tablet Take 60 mg by mouth 2 (two) times daily. 04/09/17  Yes [provider]  metoprolol tartrate (LOPRESSOR) 25 MG tablet Take 50 mg by mouth 2 (two) times daily. 06/22/17  Yes [provider]  mycophenolate (MYFORTIC) 180 MG EC tablet Take 180-360 mg by mouth See admin instructions. Take 360 mg every morning and take 180 mg every evening 06/04/17  Yes [provider]  pravastatin (PRAVACHOL) 40 MG tablet Take 40 mg by mouth at bedtime.  09/04/17  Yes [provider]  predniSONE (DELTASONE) 5 MG tablet Take 5 mg by mouth daily. 07/24/17  Yes [provider]  sulfamethoxazole-trimethoprim  (BACTRIM,SEPTRA) 400-80 MG tablet Take 1 tablet by mouth every Monday, Wednesday, and Friday. 09/05/17  Yes [provider]  tacrolimus (PROGRAF) 1 MG capsule Take 2 mg by mouth 2 (two) times daily.  07/06/17  Yes [provider]    Family History Family History  Problem Relation Age of Onset  . CAD Mother   . Lung cancer Mother   . Stroke Father   . Heart attack Father   . Diabetes Mellitus I Father   . Hypertension Father   . Breast cancer Sister   . Hypertension Sister   . Heart attack Brother     Social History Social History   Tobacco Use  . Smoking status: Never Smoker  . Smokeless tobacco: Never Used  Substance Use Topics  . Alcohol use: No    Frequency: Never  . Drug use: No     Allergies   Atorvastatin; Hydrocodone-acetaminophen; Labetalol; Meperidine; and Pregabalin   Review of Systems Review of Systems  All other systems reviewed and are negative.    Physical Exam Updated Vital Signs BP (!) 159/62   Pulse 63   Temp 98.2 F (36.8 C) (Oral)   Resp 15   Ht 5\' 7"  (1.702 m)   Wt 90.7 kg (200 lb)   SpO2 98%   BMI 31.32 kg/m   Physical Exam  Constitutional: She appears well-developed and well-nourished. No distress.  HENT:  Head: Normocephalic and atraumatic.  Mouth/Throat: Oropharynx is clear and moist. No oropharyngeal exudate.  Eyes: Pupils are equal, round, and reactive to light. Conjunctivae and EOM are normal. Right eye exhibits no discharge. Left eye exhibits no discharge. No scleral icterus.  Neck: Normal range of motion. Neck supple. No JVD present. No thyromegaly present.  Cardiovascular: Normal rate, regular rhythm, normal heart sounds and intact distal pulses. Exam reveals no gallop and no friction rub.  No murmur heard. Pulmonary/Chest: Effort normal and breath sounds normal. No respiratory distress. She has no wheezes. She has no rales.  Abdominal: Soft. Bowel sounds are normal. She exhibits no distension and no mass.  There is no tenderness.  Musculoskeletal: She exhibits tenderness and deformity. She exhibits no edema.  Upper extremities and right lower extremity appear normal with normal range of motion.  The fistula is present in the left upper extremity with a normal thrill.  Left lower extremity has an obvious deformity just distal to the knee.  The patella seems to be intact however there is tenderness and deformity with a shortened and laterally rotated foot.  Pulses at the left foot are normal.  Lymphadenopathy:    She has no cervical adenopathy.  Neurological: She is alert. Coordination normal.  Normal sensation to the left foot  Skin: Skin is warm and dry. No rash noted. No erythema.  Multiple  small abrasions  Psychiatric: She has a normal mood and affect. Her behavior is normal.  Nursing note and vitals reviewed.    ED Treatments / Results  Labs (all labs ordered are listed, but only abnormal results are displayed) Labs Reviewed  BASIC METABOLIC PANEL - Abnormal; Notable for the following components:      Result Value   BUN 36 (*)    Creatinine, Ser 1.84 (*)    Calcium 8.5 (*)    GFR calc non Af Amer 28 (*)    GFR calc Af Amer 33 (*)    All other components within normal limits  HEMOGLOBIN A1C - Abnormal; Notable for the following components:   Hgb A1c MFr Bld 5.8 (*)    All other components within normal limits  GLUCOSE, CAPILLARY - Abnormal; Notable for the following components:   Glucose-Capillary 50 (*)    All other components within normal limits  MRSA PCR SCREENING  CBC WITH DIFFERENTIAL/PLATELET  PROTIME-INR  URINALYSIS, ROUTINE W REFLEX MICROSCOPIC  TROPONIN I  GLUCOSE, CAPILLARY  GLUCOSE, CAPILLARY  BASIC METABOLIC PANEL  TYPE AND SCREEN  ABO/RH    EKG EKG Interpretation  Date/Time:  Thursday March 11 2018 11:34:02 EDT Ventricular Rate:  56 PR Interval:    QRS Duration: 96 QT Interval:  477 QTC Calculation: 461 R Axis:   67 Text Interpretation:  Sinus  rhythm Probable left atrial enlargement since last tracing no significant change Confirmed by Eber Hong ([id number]) on 03/11/2018 12:06:49 PM   Radiology Dg Knee 1-2 Views Left  Result Date: 03/11/2018 CLINICAL DATA:  Fall with left leg deformity EXAM: LEFT KNEE - 1-2 VIEW COMPARISON:  None. FINDINGS: Tibial fracture at the metaphyseal junction with 13 mm of lateral displacement. Comminuted fracturing of the proximal fibula. The knee joint is located. Negative for knee joint effusion. Gas may be present superficial to the tibial tuberosity, open fracture is radiographic consideration. IMPRESSION: 1. Displaced proximal tibial fracture without joint effusion or visible articular surface extension. 2. Comminuted proximal fibula. 3. Possible soft tissue gas at the tibial tuberosity. Electronically Signed   By: Marnee Spring M.D.   On: 03/11/2018 11:19   Dg Tibia/fibula Left  Result Date: 03/11/2018 CLINICAL DATA:  Fall, leg deformity and pain EXAM: LEFT TIBIA AND FIBULA - 2 VIEW COMPARISON:  None. FINDINGS: Comminuted fractures noted through the proximal left tibia and fibula with moderate displacement. No distal tibia or fibular abnormalities. No subluxation or dislocation within the left knee. IMPRESSION: Markedly comminuted and moderately displaced proximal left tibia and fibular metaphyseal fractures. Electronically Signed   By: Charlett Nose M.D.   On: 03/11/2018 11:17   Dg Ankle 2 Views Left  Result Date: 03/11/2018 CLINICAL DATA:  Fall, left leg deformity and pain EXAM: LEFT ANKLE - 2 VIEW COMPARISON:  None. FINDINGS: No acute bony abnormality. Specifically, no fracture, subluxation, or dislocation. IMPRESSION: No acute bony abnormality. Electronically Signed   By: Charlett Nose M.D.   On: 03/11/2018 11:17   Ct Knee Left Wo Contrast  Result Date: 03/11/2018 CLINICAL DATA:  Fall with leg deformity. Fractures of the proximal tibia and fibula on radiographs. EXAM: CT OF THE LEFT KNEE WITHOUT CONTRAST  TECHNIQUE: Multidetector CT imaging of the left knee was performed according to the standard protocol. Multiplanar CT image reconstructions were also generated. COMPARISON:  Radiograph same date. FINDINGS: Bones/Joint/Cartilage Comminuted tibial metadiaphyseal fracture demonstrates impaction and up to 15 mm of lateral displacement. There is no significant angulation. There is no proximal extension of this  fracture to the articular surface of the tibia. A small amount of gas is present within the fracture. There are no specific signs of an open fracture. There is a comminuted and mildly displaced intra-articular fracture of the fibular head and neck. There is no widening of the proximal tibiofibular articulation. The distal femur and patella are intact. There is no significant knee joint effusion or gas in the joint. Ligaments Suboptimally assessed by CT. The cruciate ligaments appear intact at the knee. Muscles and Tendons The extensor mechanism is intact. No focal muscular fluid collections are seen. Soft tissues There is edema/hemorrhage in the subcutaneous fat of the lower leg anteriorly and medially. There is no focal hematoma. A small amount of subcutaneous gas is noted anterior to the tibial tubercle, well proximal to the tibial fracture. Femoropopliteal atherosclerosis noted. IMPRESSION: 1. Comminuted and laterally displaced extra-articular fracture of the proximal left tibial metadiaphysis. 2. Comminuted and mildly displaced intra-articular fracture of the left fibular head and neck. 3. No intra-articular abnormality seen at the tibiofemoral joint. Electronically Signed   By: Carey Bullocks M.D.   On: 03/11/2018 14:52   Dg Chest Port 1 View  Result Date: 03/11/2018 CLINICAL DATA:  Encounter for trauma EXAM: PORTABLE CHEST 1 VIEW COMPARISON:  10/03/2017 FINDINGS: The heart is mildly enlarged. Mediastinal contour is normal. There is atherosclerotic calcification of the thoracic aorta. The lungs are free of  focal consolidations and pleural effusions. No pulmonary edema. No pneumothorax. There is streaky atelectasis or scarring at the LEFT lung base. No acute fracture. IMPRESSION: 1. Stable cardiomegaly. 2.  No evidence for acute  abnormality. 3.  Aortic atherosclerosis.  (ICD10-I70.0) Electronically Signed   By: Norva Pavlov M.D.   On: 03/11/2018 11:51   Dg Femur Min 2 Views Left  Result Date: 03/11/2018 CLINICAL DATA:  Fall, left lower leg deformity. EXAM: LEFT FEMUR 2 VIEWS COMPARISON:  None. FINDINGS: No femoral fracture, subluxation or dislocation. Hip joint and knee joint are intact. Proximal tibial and fibular fractures visualized, reported on knee and tib-fib series IMPRESSION: Proximal tibial and fibular fractures.  See knee and tib fib report. No femoral abnormality. Electronically Signed   By: Charlett Nose M.D.   On: 03/11/2018 11:16    Procedures Procedures (including critical care time)  Medications Ordered in ED Medications  oxyCODONE (Oxy IR/ROXICODONE) immediate release tablet 10-20 mg (20 mg Oral Given 03/12/18 0053)  povidone-iodine 10 % swab 2 application (has no administration in time range)  ceFAZolin (ANCEF) IVPB 2g/100 mL premix (has no administration in time range)  amLODipine (NORVASC) tablet 5 mg (has no administration in time range)  doxazosin (CARDURA) tablet 4 mg (4 mg Oral Given 03/11/18 2101)  ferrous sulfate tablet 325 mg (has no administration in time range)  furosemide (LASIX) tablet 80 mg (has no administration in time range)  hydrALAZINE (APRESOLINE) tablet 100 mg (100 mg Oral Given 03/12/18 0616)  isosorbide mononitrate (IMDUR) 24 hr tablet 60 mg (60 mg Oral Given 03/11/18 1847)  metoprolol tartrate (LOPRESSOR) tablet 50 mg (50 mg Oral Given 03/11/18 2059)  pravastatin (PRAVACHOL) tablet 40 mg (40 mg Oral Given 03/11/18 2059)  predniSONE (DELTASONE) tablet 5 mg (has no administration in time range)  sulfamethoxazole-trimethoprim (BACTRIM,SEPTRA) 400-80 MG per tablet 1  tablet (has no administration in time range)  tacrolimus (PROGRAF) capsule 2 mg (2 mg Oral Given 03/11/18 2100)  0.9 %  sodium chloride infusion ( Intravenous New Bag/Given 03/11/18 1549)  HYDROcodone-acetaminophen (NORCO/VICODIN) 5-325 MG per tablet 1-2 tablet (has  no administration in time range)  polyethylene glycol (MIRALAX / GLYCOLAX) packet 17 g (has no administration in time range)  mycophenolate (MYFORTIC) EC tablet 360 mg (has no administration in time range)    And  mycophenolate (MYFORTIC) EC tablet 180 mg (180 mg Oral Given 03/11/18 2100)  insulin aspart (novoLOG) injection 0-9 Units (has no administration in time range)  insulin glargine (LANTUS) injection 10 Units (10 Units Subcutaneous Not Given 03/11/18 2104)  dextrose 50 % solution 50 mL (has no administration in time range)  chlorhexidine (HIBICLENS) 4 % liquid 4 application (4 application Topical Given 03/12/18 [id number])     Initial Impression / Assessment and Plan / ED Course  I have reviewed the triage vital signs and the nursing notes.  Pertinent labs & imaging results that were available during my care of the patient were reviewed by me and considered in my medical decision making (see chart for details).  Clinical Course as of Mar 12 754  Thu Mar 11, 2018  1102 I have personally viewed the x-rays, the patient will need to be seen by orthopedic surgery, she has proximal tibia and fibula fractures.  There is some displacement and mild angulation.  Ankle bones look good, distal femur looks good, knee appears approximated.   [BM]  1250 Care was discussed with the orthopedic team, Mr.Michael Lin GivensJeffries who will see the patient in consultation is the physician assistant.  The patient will likely need to have surgery.  Thankfully the vital signs remained stable, she does not appear to be in any other distress and the other imaging studies have been unremarkable.  Labs are also unremarkable shy of a creatinine of 1.8.   [BM]  1250 Baseline  creatinine is 1.7   [BM]    Clinical Course User Index [BM] Eber HongMiller, Kendarrius Tanzi, MD   The patient was otherwise in a good state of health prior to her fall this morning, she had no chest pain shortness of breath fevers chills vomiting diarrhea headaches or any other complaints.  She now has an acute deformity of the leg which is consistent with a fracture or possible dislocation of the knee.  Either way that she appears acutely ill and will need surgical evaluation pending x-rays.  She has been given significant amount of fentanyl prehospital.  At this time she will go over to x-ray to get this imaged, she will be immobilized as needed, she does appear to have an intact vascular status of her left lower extremity with normal pulses of the dorsalis pedis and posterior tibialis.  We will also get preop EKG and chest x-ray.  X-rays appear consistent with fracture of the tibia and fibula of the left lower extremity.  I discussed the care with the orthopedic team as well as with the hospitalist, the hospitalist will admit, the patient will likely have surgery tomorrow.  Pain controlled with intravenous medications.  Final Clinical Impressions(s) / ED Diagnoses   Final diagnoses:  Other closed fracture of proximal end of left tibia, initial encounter  Closed fracture of proximal end of left fibula, unspecified fracture morphology, initial encounter      Eber HongMiller, Meko Bellanger, MD 03/12/18 [phone]

## 2018-03-11 NOTE — ED Triage Notes (Signed)
PT brought in by [facility] after pt tripped over her dog ; pt has a + deformity noted to the left leg; + pulses felt ; pt c/o 7/10 pain ; pt received 200 mcg of fentanyl and 8 mg of Zofran by EMS

## 2018-03-11 NOTE — H&P (Signed)
History and Physical    Hannah GellDonna Vinsant JXB:[id number] DOB: 05/27/1956 DOA: 03/11/2018   PCP: Shellia CleverlyBeane, Lori M, PA    Patient coming from:  Home    Chief Complaint: Fall   HPI: Hannah Valentine is a very pleasant 62 y.o. female with medical history significant for HTN, CKD stage III, the patient has a history of kidney transplant in 2017, hyperlipidemia, diabetes on insulin pump, who was brought by the EMS, says she suffered a mechanical fall.  In review, the patient tripped over her dog, sustaining an injury on the left leg, when she fell she had broken.  She could not get up.  She localized the pain at the area of the fracture.  She denies any other areas of pain.  She did not hit her head.  She denies any nausea, vomiting, chest pain or palpitations.  She denies any shortness of breath or cough.  The patient denies any abdominal pain, dysuria, or hematuria.  The patient makes urine.  She denies any right lower extremity swelling.  She denies any tobacco, alcohol or recreational drug use.  No confusion is noted.  She denies any vision changes.  She denies any unilateral weakness.  ED Course:  BP (!) 145/56   Pulse (!) 56   Resp 14   Ht 5\' 7"  (1.702 m)   Wt 90.7 kg (200 lb)   SpO2 97%   BMI 31.32 kg/m   Patient has been receiving fentanyl, with immediate response, but the medicine wearing off rather quickly, before she is being placed on longer lasting meds, including morphine and oral narcotics.  Currently, the pain is about 5 out of 10. X-rays demonstrated left tibial and fibular fracture.  Orthopedic consultation was obtained, and recommended a CT of the knee to rule out articular extension, which confirmed a comminuted tibial metadiaphyseal fracture, with impaction and up to 15 mm of lateral displacement, without significant angulation.  There is no proximal extension of this fracture.  No intra-articular abnormality seen at the tibiofemoral joint.  ORIF is planned for tomorrow, with Dr.  [name]. Chemistries are remarkable for creatinine of 1.84, slightly more elevated than the one in December at 1.68, but within range.   White count 7.2, hemoglobin 14, platelets 182. PT 13.9, INR 1.08.    Review of Systems:  As per HPI otherwise all other systems reviewed and are negative  Past Medical History:  Diagnosis Date  . CKD (chronic kidney disease), stage III (HCC)   . Essential hypertension   . HLD (hyperlipidemia)   . Kidney transplanted 2017  . Type II diabetes mellitus with renal manifestations [facility](HCC)     Past Surgical History:  Procedure Laterality Date  . NEPHRECTOMY TRANSPLANTED ORGAN      Social History Social History   Socioeconomic History  . Marital status: Married    Spouse name: Not on file  . Number of children: Not on file  . Years of education: Not on file  . Highest education level: Not on file  Occupational History  . Not on file  Social Needs  . Financial resource strain: Not on file  . Food insecurity:    Worry: Not on file    Inability: Not on file  . Transportation needs:    Medical: Not on file    Non-medical: Not on file  Tobacco Use  . Smoking status: Never Smoker  . Smokeless tobacco: Never Used  Substance and Sexual Activity  . Alcohol use: No    Frequency:  Never  . Drug use: No  . Sexual activity: Not on file  Lifestyle  . Physical activity:    Days per week: Not on file    Minutes per session: Not on file  . Stress: Not on file  Relationships  . Social connections:    Talks on phone: Not on file    Gets together: Not on file    Attends religious service: Not on file    Active member of club or organization: Not on file    Attends meetings of clubs or organizations: Not on file    Relationship status: Not on file  . Intimate partner violence:    Fear of current or ex partner: Not on file    Emotionally abused: Not on file    Physically abused: Not on file    Forced sexual activity: Not on file  Other Topics  Concern  . Not on file  Social History Narrative  . Not on file     Allergies  Allergen Reactions  . Atorvastatin Other (See Comments)    Muscle weakness  . Hydrocodone-Acetaminophen Nausea Only  . Labetalol Other (See Comments)    "makes her feel bad"  . Meperidine Nausea Only  . Pregabalin Other (See Comments)    dizzy    Family History  Problem Relation Age of Onset  . CAD Mother   . Lung cancer Mother   . Stroke Father   . Heart attack Father   . Diabetes Mellitus I Father   . Hypertension Father   . Breast cancer Sister   . Hypertension Sister   . Heart attack Brother       Prior to Admission medications   Medication Sig Start Date End Date Taking? Authorizing Provider  amLODipine (NORVASC) 5 MG tablet Take 5 mg by mouth daily. 02/26/18  Yes [provider]  aspirin EC 81 MG tablet Take 81 mg by mouth at bedtime.    Yes [provider]  Cholecalciferol (VITAMIN D) 2000 units tablet Take 5,000 Units by mouth daily.  09/24/17  Yes [provider]  doxazosin (CARDURA) 4 MG tablet Take 4 mg by mouth every evening. 07/24/17  Yes [provider]  ferrous sulfate 325 (65 FE) MG EC tablet Take 325 mg by mouth daily.   Yes [provider]  furosemide (LASIX) 80 MG tablet Take 80 mg by mouth daily. 07/08/17  Yes [provider]  hydrALAZINE (APRESOLINE) 100 MG tablet Take 100 mg by mouth 3 (three) times daily. 07/24/17  Yes [provider]  Insulin Human (INSULIN PUMP) SOLN Inject into the skin continuous. Novolog   Yes [provider]  isosorbide mononitrate (IMDUR) 60 MG 24 hr tablet Take 60 mg by mouth 2 (two) times daily. 04/09/17  Yes [provider]  metoprolol tartrate (LOPRESSOR) 25 MG tablet Take 50 mg by mouth 2 (two) times daily. 06/22/17  Yes [provider]  mycophenolate (MYFORTIC) 180 MG EC tablet Take 180-360 mg by mouth See admin instructions. Take 360 mg every morning and  take 180 mg every evening 06/04/17  Yes [provider]  pravastatin (PRAVACHOL) 40 MG tablet Take 40 mg by mouth at bedtime.  09/04/17  Yes [provider]  predniSONE (DELTASONE) 5 MG tablet Take 5 mg by mouth daily. 07/24/17  Yes [provider]  sulfamethoxazole-trimethoprim (BACTRIM,SEPTRA) 400-80 MG tablet Take 1 tablet by mouth every Monday, Wednesday, and Friday. 09/05/17  Yes [provider]  tacrolimus (PROGRAF) 1  MG capsule Take 2 mg by mouth 2 (two) times daily.  07/06/17  Yes [provider]    Physical Exam:  Vitals:   03/11/18 1245 03/11/18 1300 03/11/18 1330 03/11/18 1400  BP: (!) 150/54 (!) 141/56 (!) 127/54 (!) 145/56  Pulse: (!) 55 (!) 53 (!) 56 (!) 56  Resp: 15 12 16 14   SpO2: 97% 98% 98% 97%  Weight:      Height:       Constitutional: NAD, calm, ill appearing.   Eyes: PERRL, lids and conjunctivae normal ENMT: Mucous membranes are moist, without exudate or lesions  Neck: normal, supple, no masses, no thyromegaly Respiratory: clear to auscultation bilaterally, no wheezing, no crackles. Normal respiratory effort  Cardiovascular: Regular rate and rhythm,  murmur, rubs or gallops. No extremity edema. 2+ pedal pulses. No carotid bruits.  Abdomen: Soft, non tender, No hepatosplenomegaly. Bowel sounds positive.  Musculoskeletal: On the right, no clubbing / cyanosis. Moves the right extremities.  Left lower extremity is immobilized, with sling.  At the proximal lower extremity, there is severe tenderness to palpation.  The foot is externally rotated.  Skin: no jaundice, No lesions.  Neurologic: Sensation intact  Strength equal in the right extremities, the left lower extremity strength is not tested. Psychiatric:   Alert and oriented x 3. Normal mood.     Labs on Admission: I have personally reviewed following labs and imaging studies  CBC: Recent Labs  Lab 03/11/18 1103  WBC 7.2  NEUTROABS 5.5  HGB 14.0  HCT 43.9  MCV  96.7  PLT 182    Basic Metabolic Panel: Recent Labs  Lab 03/11/18 1103  NA 142  K 3.7  CL 109  CO2 24  GLUCOSE 89  BUN 36*  CREATININE 1.84*  CALCIUM 8.5*    GFR: Estimated Creatinine Clearance: 36.6 mL/min (A) (by C-G formula based on SCr of 1.84 mg/dL (H)).  Liver Function Tests: No results for input(s): AST, ALT, ALKPHOS, BILITOT, PROT, ALBUMIN in the last 168 hours. No results for input(s): LIPASE, AMYLASE in the last 168 hours. No results for input(s): AMMONIA in the last 168 hours.  Coagulation Profile: Recent Labs  Lab 03/11/18 1103  INR 1.08    Cardiac Enzymes: No results for input(s): CKTOTAL, CKMB, CKMBINDEX, TROPONINI in the last 168 hours.  BNP (last 3 results) No results for input(s): PROBNP in the last 8760 hours.  HbA1C: No results for input(s): HGBA1C in the last 72 hours.  CBG: No results for input(s): GLUCAP in the last 168 hours.  Lipid Profile: No results for input(s): CHOL, HDL, LDLCALC, TRIG, CHOLHDL, LDLDIRECT in the last 72 hours.  Thyroid Function Tests: No results for input(s): TSH, T4TOTAL, FREET4, T3FREE, THYROIDAB in the last 72 hours.  Anemia Panel: No results for input(s): VITAMINB12, FOLATE, FERRITIN, TIBC, IRON, RETICCTPCT in the last 72 hours.  Urine analysis:    Component Value Date/Time   COLORURINE YELLOW 10/04/2017 0300   APPEARANCEUR CLEAR 10/04/2017 0300   LABSPEC 1.015 10/04/2017 0300   PHURINE 5.0 10/04/2017 0300   GLUCOSEU NEGATIVE 10/04/2017 0300   HGBUR NEGATIVE 10/04/2017 0300   BILIRUBINUR NEGATIVE 10/04/2017 0300   KETONESUR 5 (A) 10/04/2017 0300   PROTEINUR NEGATIVE 10/04/2017 0300   NITRITE NEGATIVE 10/04/2017 0300   LEUKOCYTESUR NEGATIVE 10/04/2017 0300    Sepsis Labs: @LABRCNTIP (procalcitonin:4,lacticidven:4) )No results found for this or any previous visit (from the past 240 hour(s)).   Radiological Exams on Admission: Dg Knee 1-2 Views Left  Result Date: 03/11/2018  CLINICAL DATA:  Fall  with left leg deformity EXAM: LEFT KNEE - 1-2 VIEW COMPARISON:  None. FINDINGS: Tibial fracture at the metaphyseal junction with 13 mm of lateral displacement. Comminuted fracturing of the proximal fibula. The knee joint is located. Negative for knee joint effusion. Gas may be present superficial to the tibial tuberosity, open fracture is radiographic consideration. IMPRESSION: 1. Displaced proximal tibial fracture without joint effusion or visible articular surface extension. 2. Comminuted proximal fibula. 3. Possible soft tissue gas at the tibial tuberosity. Electronically Signed   By: Marnee Spring M.D.   On: 03/11/2018 11:19   Dg Tibia/fibula Left  Result Date: 03/11/2018 CLINICAL DATA:  Fall, leg deformity and pain EXAM: LEFT TIBIA AND FIBULA - 2 VIEW COMPARISON:  None. FINDINGS: Comminuted fractures noted through the proximal left tibia and fibula with moderate displacement. No distal tibia or fibular abnormalities. No subluxation or dislocation within the left knee. IMPRESSION: Markedly comminuted and moderately displaced proximal left tibia and fibular metaphyseal fractures. Electronically Signed   By: Charlett Nose M.D.   On: 03/11/2018 11:17   Dg Ankle 2 Views Left  Result Date: 03/11/2018 CLINICAL DATA:  Fall, left leg deformity and pain EXAM: LEFT ANKLE - 2 VIEW COMPARISON:  None. FINDINGS: No acute bony abnormality. Specifically, no fracture, subluxation, or dislocation. IMPRESSION: No acute bony abnormality. Electronically Signed   By: Charlett Nose M.D.   On: 03/11/2018 11:17   Dg Chest Port 1 View  Result Date: 03/11/2018 CLINICAL DATA:  Encounter for trauma EXAM: PORTABLE CHEST 1 VIEW COMPARISON:  10/03/2017 FINDINGS: The heart is mildly enlarged. Mediastinal contour is normal. There is atherosclerotic calcification of the thoracic aorta. The lungs are free of focal consolidations and pleural effusions. No pulmonary edema. No pneumothorax. There is streaky atelectasis or scarring at the  LEFT lung base. No acute fracture. IMPRESSION: 1. Stable cardiomegaly. 2.  No evidence for acute  abnormality. 3.  Aortic atherosclerosis.  (ICD10-I70.0) Electronically Signed   By: Norva Pavlov M.D.   On: 03/11/2018 11:51   Dg Femur Min 2 Views Left  Result Date: 03/11/2018 CLINICAL DATA:  Fall, left lower leg deformity. EXAM: LEFT FEMUR 2 VIEWS COMPARISON:  None. FINDINGS: No femoral fracture, subluxation or dislocation. Hip joint and knee joint are intact. Proximal tibial and fibular fractures visualized, reported on knee and tib-fib series IMPRESSION: Proximal tibial and fibular fractures.  See knee and tib fib report. No femoral abnormality. Electronically Signed   By: Charlett Nose M.D.   On: 03/11/2018 11:16    EKG: Independently reviewed.  Assessment/Plan Principal Problem:   Left tibial fracture Active Problems:   Fall   History of kidney transplant   Essential hypertension   HLD (hyperlipidemia)   CKD (chronic kidney disease), stage III (HCC)   DM (diabetes mellitus), type 1 (HCC)     Left tibial fracture, with CT of the knee showing no articular extension.  Patient is for ORIF tomorrow by Dr. [name].  Chest x-ray NAD, CBC and cement unremarkable.  The patient received IV medications, was immobilized in the left lower extremity.  Preoperative risk one-point, class II, 6.0% Admit to med surg, anticipating surgery as per Ortho Hip fracture order set NPO after midnight SCDs overnight IVF Pain control with morphine, and oral narcotic, Tylenol. CXR and EKG prior to surgery if not done at the ED  Hold ASA PT/OT consult postop   Type II Diabetes Current blood sugar level is 89.  Patient wishes to control her pump.  No results found for: HGBA1C Continue pump   Chronic kidney disease stage 3,, status post renal transplant 2017, on oral immunosuppressant therapy.  Current creatinine is 1.84, slightly more elevated than her baseline, at 1.6 Lab Results  Component Value Date    CREATININE 1.84 (H) 03/11/2018   CREATININE 1.68 (H) 10/05/2017   CREATININE 1.68 (H) 10/04/2017  Continue prednisone, Bactrim, Prograf Very gentle IV fluids If by tomorrow her creatinine continues to increase, will call her Lasix.  She has taken her morning Lasix already today. Repeat BMET in am  Hold NSAIDS  Hyperlipidemia Continue home statins    Hypertension BP (!) 121/44   Pulse (!) 56   Controlled Continue home anti-hypertensive medications     DVT prophylaxis:  SDD RLE Code Status:    Full  Family Communication:  Discussed with patient Disposition Plan: Expect patient to be discharged to home after condition improves Consults called:    Ortho per EDP  Admission status: IP MEdsurg    Marlowe Kays, PA-C [facility]   Amion text  [phone]   03/11/2018, 2:44 PM

## 2018-03-11 NOTE — Consult Note (Signed)
Reason for Consult:Tibia fx Referring Physician: B Princetta Hannah Valentine is an 62 y.o. female.  HPI: Hannah Valentine was out with her dog on a leash today. She had her back towards the dog as she was preparing to pick up after it. The dog saw a squirrel and took off, yanking her into a nearby tree. Her left leg hit it and broke. She had immediate pain and could not get up. She came to the ED where x-rays showed a proximal left tib/fib fx and orthopedic surgery was consulted. She c/o localized pain in the area of the fracture. She is retired and lives at home with her husband.  Past Medical History:  Diagnosis Date  . CKD (chronic kidney disease), stage III ([place])   . Essential hypertension   . HLD (hyperlipidemia)   . Kidney transplanted 2017  . Type II diabetes mellitus with renal manifestations [facility])     Past Surgical History:  Procedure Laterality Date  . NEPHRECTOMY TRANSPLANTED ORGAN      Family History  Problem Relation Age of Onset  . CAD Mother   . Lung cancer Mother   . Stroke Father   . Heart attack Father   . Diabetes Mellitus I Father   . Hypertension Father   . Breast cancer Sister   . Hypertension Sister   . Heart attack Brother     Social History:  reports that she has never smoked. She has never used smokeless tobacco. She reports that she does not drink alcohol or use drugs.  Allergies:  Allergies  Allergen Reactions  . Atorvastatin Other (See Comments)    Muscle weakness  . Hydrocodone-Acetaminophen Nausea Only  . Labetalol Other (See Comments)    "makes her feel bad"  . Meperidine Nausea Only  . Pregabalin Other (See Comments)    dizzy    Medications: I have reviewed the patient's current medications.  Results for orders placed or performed during the hospital encounter of 03/11/18 (from the past 48 hour(s))  CBC with Differential/Platelet     Status: None   Collection Time: 03/11/18 11:03 AM  Result Value Ref Range   WBC 7.2 4.0 - 10.5 K/uL   RBC 4.54  3.87 - 5.11 MIL/uL   Hemoglobin 14.0 12.0 - 15.0 g/dL   HCT 43.9 36.0 - 46.0 %   MCV 96.7 78.0 - 100.0 fL   MCH 30.8 26.0 - 34.0 pg   MCHC 31.9 30.0 - 36.0 g/dL   RDW 14.5 11.5 - 15.5 %   Platelets 182 150 - 400 K/uL   Neutrophils Relative % 76 %   Neutro Abs 5.5 1.7 - 7.7 K/uL   Lymphocytes Relative 14 %   Lymphs Abs 1.0 0.7 - 4.0 K/uL   Monocytes Relative 6 %   Monocytes Absolute 0.5 0.1 - 1.0 K/uL   Eosinophils Relative 2 %   Eosinophils Absolute 0.2 0.0 - 0.7 K/uL   Basophils Relative 1 %   Basophils Absolute 0.1 0.0 - 0.1 K/uL   Immature Granulocytes 1 %   Abs Immature Granulocytes 0.0 0.0 - 0.1 K/uL    Comment: Performed at [facility], [address] [place], [place] [address]  Basic metabolic panel     Status: Abnormal   Collection Time: 03/11/18 11:03 AM  Result Value Ref Range   Sodium 142 135 - 145 mmol/L   Potassium 3.7 3.5 - 5.1 mmol/L   Chloride 109 101 - 111 mmol/L   CO2 24 22 - 32  mmol/L   Glucose, Bld 89 65 - 99 mg/dL   BUN 36 (H) 6 - 20 mg/dL   Creatinine, Ser 1.84 (H) 0.44 - 1.00 mg/dL   Calcium 8.5 (L) 8.9 - 10.3 mg/dL   GFR calc non Af Amer 28 (L) >60 mL/min   GFR calc Af Amer 33 (L) >60 mL/min    Comment: (NOTE) The eGFR has been calculated using the CKD EPI equation. This calculation has not been validated in all clinical situations. eGFR's persistently <60 mL/min signify possible Chronic Kidney Disease.    Anion gap 9 5 - 15    Comment: Performed at [facility] [address] [place], [place] [address]  Protime-INR     Status: None   Collection Time: 03/11/18 11:03 AM  Result Value Ref Range   Prothrombin Time 13.9 11.4 - 15.2 seconds   INR 1.08     Comment: Performed at [facility] [address] [place], [place] [address]  Type and screen     Status: None (Preliminary result)   Collection Time: 03/11/18 11:10 AM  Result Value Ref Range   ABO/RH(D) B NEG    Antibody Screen NEG    Sample Expiration 03/14/2018     Unit Number [id number]    Blood Component Type RED CELLS,LR    Unit division 00    Status of Unit ALLOCATED    Transfusion Status OK TO TRANSFUSE    Crossmatch Result      Compatible Performed at [facility], [place] [address], [place], [place] [address]    Unit Number [id number]    Blood Component Type RBC LR PHER1    Unit division 00    Status of Unit ALLOCATED    Transfusion Status OK TO TRANSFUSE    Crossmatch Result Compatible   ABO/Rh     Status: None   Collection Time: 03/11/18 11:10 AM  Result Value Ref Range   ABO/RH(D)      B NEG Performed at [facility] [address] [place], [place] [address]     Dg Knee 1-2 Views Left  Result Date: 03/11/2018 CLINICAL DATA:  Fall with left leg deformity EXAM: LEFT KNEE - 1-2 VIEW COMPARISON:  None. FINDINGS: Tibial fracture at the metaphyseal junction with 13 mm of lateral displacement. Comminuted fracturing of the proximal fibula. The knee joint is located. Negative for knee joint effusion. Gas may be present superficial to the tibial tuberosity, open fracture is radiographic consideration. IMPRESSION: 1. Displaced proximal tibial fracture without joint effusion or visible articular surface extension. 2. Comminuted proximal fibula. 3. Possible soft tissue gas at the tibial tuberosity. Electronically Signed   By: Monte Fantasia M.D.   On: 03/11/2018 11:19   Dg Tibia/fibula Left  Result Date: 03/11/2018 CLINICAL DATA:  Fall, leg deformity and pain EXAM: LEFT TIBIA AND FIBULA - 2 VIEW COMPARISON:  None. FINDINGS: Comminuted fractures noted through the proximal left tibia and fibula with moderate displacement. No distal tibia or fibular abnormalities. No subluxation or dislocation within the left knee. IMPRESSION: Markedly comminuted and moderately displaced proximal left tibia and fibular metaphyseal fractures. Electronically Signed   By: Rolm Baptise M.D.   On: 03/11/2018 11:17   Dg Ankle 2 Views Left  Result Date:  03/11/2018 CLINICAL DATA:  Fall, left leg deformity and pain EXAM: LEFT ANKLE - 2 VIEW COMPARISON:  None. FINDINGS: No acute bony abnormality. Specifically, no fracture, subluxation, or dislocation. IMPRESSION: No acute bony abnormality.  Electronically Signed   By: Rolm Baptise M.D.   On: 03/11/2018 11:17   Dg Chest Port 1 View  Result Date: 03/11/2018 CLINICAL DATA:  Encounter for trauma EXAM: PORTABLE CHEST 1 VIEW COMPARISON:  10/03/2017 FINDINGS: The heart is mildly enlarged. Mediastinal contour is normal. There is atherosclerotic calcification of the thoracic aorta. The lungs are free of focal consolidations and pleural effusions. No pulmonary edema. No pneumothorax. There is streaky atelectasis or scarring at the LEFT lung base. No acute fracture. IMPRESSION: 1. Stable cardiomegaly. 2.  No evidence for acute  abnormality. 3.  Aortic atherosclerosis.  (ICD10-I70.0) Electronically Signed   By: Nolon Nations M.D.   On: 03/11/2018 11:51   Dg Femur Min 2 Views Left  Result Date: 03/11/2018 CLINICAL DATA:  Fall, left lower leg deformity. EXAM: LEFT FEMUR 2 VIEWS COMPARISON:  None. FINDINGS: No femoral fracture, subluxation or dislocation. Hip joint and knee joint are intact. Proximal tibial and fibular fractures visualized, reported on knee and tib-fib series IMPRESSION: Proximal tibial and fibular fractures.  See knee and tib fib report. No femoral abnormality. Electronically Signed   By: Rolm Baptise M.D.   On: 03/11/2018 11:16    Review of Systems  Constitutional: Negative for weight loss.  HENT: Negative for ear discharge, ear pain, hearing loss and tinnitus.   Eyes: Negative for blurred vision, double vision, photophobia and pain.  Respiratory: Negative for cough, sputum production and shortness of breath.   Cardiovascular: Negative for chest pain.  Gastrointestinal: Negative for abdominal pain, nausea and vomiting.  Genitourinary: Negative for dysuria, flank pain, frequency and urgency.   Musculoskeletal: Positive for joint pain (Left knee/tibia). Negative for back pain, falls, myalgias and neck pain.  Neurological: Negative for dizziness, tingling, sensory change, focal weakness, loss of consciousness and headaches.  Endo/Heme/Allergies: Does not bruise/bleed easily.  Psychiatric/Behavioral: Negative for depression, memory loss and substance abuse. The patient is not nervous/anxious.    Blood pressure (!) 127/54, pulse (!) 56, resp. rate 16, height 5' 7"  (1.702 m), weight 90.7 kg (200 lb), SpO2 98 %. Physical Exam  Constitutional: She appears well-developed and well-nourished. No distress.  HENT:  Head: Normocephalic and atraumatic.  Eyes: Conjunctivae are normal. Right eye exhibits no discharge. Left eye exhibits no discharge. No scleral icterus.  Neck: Normal range of motion.  Cardiovascular: Normal rate and regular rhythm.  Respiratory: Effort normal. No respiratory distress.  Musculoskeletal:  LLE No traumatic wounds or rash  Severe TTP proximal lower leg, foot externally rotated compared with knee, ecchymotic and swollen proximal lower leg  No ankle effusion  Sens DPN, SPN, TN intact  Motor EHL, ext, flex, evers 5/5  DP 2+, PT 2+, No significant edema  Neurological: She is alert.  Skin: Skin is warm and dry. She is not diaphoretic.  Psychiatric: She has a normal mood and affect. Her behavior is normal.    Assessment/Plan: Left tib/fib fx -- Will place in [place] for comfort. CT of knee to r/o articular extension. Plan to ORIF tomorrow afternoon by Dr. Doreatha Martin. NWB for 6-8 weeks likely.  DM on insulin pump, s/p renal transplant on prednisone, HTN, hyperlipidemia -- IM to admit and manage, appreciate help    Hannah Abu, PA-C Orthopedic Surgery [phone] 03/11/2018, 1:47 PM

## 2018-03-11 NOTE — Plan of Care (Signed)
  Problem: Education: Goal: Knowledge of General Education information will improve Outcome: Progressing   Problem: Clinical Measurements: Goal: Ability to maintain clinical measurements within normal limits will improve Outcome: Progressing   Problem: Activity: Goal: Risk for activity intolerance will decrease Outcome: Progressing   Problem: Nutrition: Goal: Adequate nutrition will be maintained Outcome: Progressing   Problem: Pain Managment: Goal: General experience of comfort will improve Outcome: Progressing   Problem: Skin Integrity: Goal: Risk for impaired skin integrity will decrease Outcome: Progressing

## 2018-03-11 NOTE — Anesthesia Preprocedure Evaluation (Addendum)
Anesthesia Evaluation  Patient identified by MRN, date of birth, ID band Patient awake    Reviewed: Allergy & Precautions, NPO status , Patient's Chart, lab work & pertinent test results  Airway Mallampati: II  TM Distance: >3 FB Neck ROM: Full    Dental no notable dental hx. (+) Teeth Intact, Dental Advisory Given   Pulmonary neg pulmonary ROS,    Pulmonary exam normal breath sounds clear to auscultation       Cardiovascular Exercise Tolerance: Good hypertension, Pt. on medications Normal cardiovascular exam Rhythm:Regular Rate:Normal     Neuro/Psych negative neurological ROS     GI/Hepatic negative GI ROS, Neg liver ROS,   Endo/Other  diabetes, Well Controlled, Type 1, Insulin DependentMorbid obesity  Renal/GU CRFRenal disease     Musculoskeletal   Abdominal (+) + obese,   Peds  Hematology negative hematology ROS (+)   Anesthesia Other Findings   Reproductive/Obstetrics                             Lab Results  Component Value Date   WBC 7.2 03/11/2018   HGB 14.0 03/11/2018   HCT 43.9 03/11/2018   MCV 96.7 03/11/2018   PLT 182 03/11/2018   Lab Results  Component Value Date   CREATININE 1.84 (H) 03/11/2018   BUN 36 (H) 03/11/2018   NA 142 03/11/2018   K 3.7 03/11/2018   CL 109 03/11/2018   CO2 24 03/11/2018  ; Anesthesia Physical Anesthesia Plan  ASA: III  Anesthesia Plan: General   Post-op Pain Management:  Regional for Post-op pain   Induction: Intravenous  PONV Risk Score and Plan: Treatment may vary due to age or medical condition and Ondansetron  Airway Management Planned: Oral ETT  Additional Equipment:   Intra-op Plan:   Post-operative Plan: Extubation in OR  Informed Consent: I have reviewed the patients History and Physical, chart, labs and discussed the procedure including the risks, benefits and alternatives for the proposed anesthesia with the  patient or authorized representative who has indicated his/her understanding and acceptance.   Dental advisory given  Plan Discussed with: CRNA  Anesthesia Plan Comments:       Anesthesia Quick Evaluation

## 2018-03-11 NOTE — Progress Notes (Signed)
RN tried to get in touch with a diabetic coordinator; RN did not receive any call back.

## 2018-03-11 NOTE — ED Notes (Signed)
Lunch tray ordered 

## 2018-03-11 NOTE — Progress Notes (Signed)
Orthopedic Tech Progress Note Patient Details:  Hannah Valentine 08/16/1956 [id number]  Ortho Devices Type of Ortho Device: Knee Immobilizer Ortho Device/Splint Interventions: Application   Post Interventions Instructions Provided: Care of device   Saul FordyceJennifer C Cornelis Kluver 03/11/2018, 1:51 PM

## 2018-03-12 ENCOUNTER — Inpatient Hospital Stay (HOSPITAL_COMMUNITY): Payer: Medicare Other

## 2018-03-12 ENCOUNTER — Encounter (HOSPITAL_COMMUNITY)
Admission: EM | Disposition: A | Discharge status: Rehabilitation Facility | Payer: Self-pay | Source: Home / Self Care | Attending: Internal Medicine

## 2018-03-12 ENCOUNTER — Encounter (HOSPITAL_COMMUNITY): Payer: Self-pay

## 2018-03-12 ENCOUNTER — Inpatient Hospital Stay (HOSPITAL_COMMUNITY): Payer: Medicare Other | Admitting: Anesthesiology

## 2018-03-12 DIAGNOSIS — S82832A Other fracture of upper and lower end of left fibula, initial encounter for closed fracture: Secondary | ICD-10-CM

## 2018-03-12 HISTORY — PX: ORIF TIBIA FRACTURE: SHX5416

## 2018-03-12 HISTORY — PX: OPEN REDUCTION INTERNAL FIXATION (ORIF) TIBIA/FIBULA FRACTURE: SHX5992

## 2018-03-12 LAB — TYPE AND SCREEN
ABO/RH(D): B NEG
Antibody Screen: NEGATIVE
UNIT DIVISION: 0
Unit division: 0

## 2018-03-12 LAB — BPAM RBC
BLOOD PRODUCT EXPIRATION DATE: 201907032359
Blood Product Expiration Date: 201907022359
UNIT TYPE AND RH: 1700
Unit Type and Rh: 1700

## 2018-03-12 LAB — GLUCOSE, CAPILLARY
GLUCOSE-CAPILLARY: 62 mg/dL — AB (ref 65–99)
GLUCOSE-CAPILLARY: 96 mg/dL (ref 65–99)
GLUCOSE-CAPILLARY: 147 mg/dL — AB (ref 65–99)
Glucose-Capillary: 50 mg/dL — ABNORMAL LOW (ref 65–99)
Glucose-Capillary: 82 mg/dL (ref 65–99)
Glucose-Capillary: 117 mg/dL — ABNORMAL HIGH (ref 65–99)
Glucose-Capillary: 131 mg/dL — ABNORMAL HIGH (ref 65–99)
Glucose-Capillary: 115 mg/dL — ABNORMAL HIGH (ref 65–99)
Glucose-Capillary: 62 mg/dL — ABNORMAL LOW (ref 65–99)

## 2018-03-12 LAB — BASIC METABOLIC PANEL
ANION GAP: 8 (ref 5–15)
BUN: 35 mg/dL — AB (ref 6–20)
CO2: 22 mmol/L (ref 22–32)
Calcium: 8.2 mg/dL — ABNORMAL LOW (ref 8.9–10.3)
Chloride: 109 mmol/L (ref 101–111)
Creatinine, Ser: 1.99 mg/dL — ABNORMAL HIGH (ref 0.44–1.00)
GFR calc Af Amer: 30 mL/min — ABNORMAL LOW (ref >60)
GFR calc non Af Amer: 26 mL/min — ABNORMAL LOW (ref >60)
GLUCOSE: 141 mg/dL — AB (ref 65–99)
POTASSIUM: 4 mmol/L (ref 3.5–5.1)
Sodium: 139 mmol/L (ref 135–145)

## 2018-03-12 SURGERY — OPEN REDUCTION INTERNAL FIXATION (ORIF) TIBIA/FIBULA FRACTURE
Anesthesia: General | Laterality: Left

## 2018-03-12 MED ORDER — ONDANSETRON HCL 4 MG/2ML IJ SOLN
4.0000 mg | Freq: Four times a day (QID) | INTRAMUSCULAR | Status: DC | PRN
  Administered 2018-03-12 – 2018-03-18 (×5): 4 mg via INTRAVENOUS
  Filled 2018-03-12 (×4): qty 2

## 2018-03-12 MED ORDER — CEFAZOLIN SODIUM-DEXTROSE 2-4 GM/100ML-% IV SOLN
2.0000 g | Freq: Three times a day (TID) | INTRAVENOUS | Status: AC
  Administered 2018-03-12 – 2018-03-13 (×3): 2 g via INTRAVENOUS
  Filled 2018-03-12 (×3): qty 100

## 2018-03-12 MED ORDER — SUGAMMADEX SODIUM 200 MG/2ML IV SOLN
INTRAVENOUS | Status: AC
  Filled 2018-03-12: qty 2

## 2018-03-12 MED ORDER — VANCOMYCIN HCL 1000 MG IV SOLR
INTRAVENOUS | Status: AC
  Filled 2018-03-12: qty 1000

## 2018-03-12 MED ORDER — HYDROCODONE-ACETAMINOPHEN 7.5-325 MG PO TABS: 1.0000 | ORAL_TABLET | Freq: Once | ORAL | Status: DC | PRN

## 2018-03-12 MED ORDER — KETAMINE HCL 10 MG/ML IJ SOLN
INTRAMUSCULAR | Status: DC | PRN
  Administered 2018-03-12: 50 mg via INTRAVENOUS

## 2018-03-12 MED ORDER — DEXAMETHASONE SODIUM PHOSPHATE 10 MG/ML IJ SOLN
INTRAMUSCULAR | Status: AC
  Filled 2018-03-12: qty 1

## 2018-03-12 MED ORDER — FENTANYL CITRATE (PF) 250 MCG/5ML IJ SOLN
INTRAMUSCULAR | Status: AC
  Filled 2018-03-12: qty 5

## 2018-03-12 MED ORDER — ONDANSETRON HCL 4 MG/2ML IJ SOLN
INTRAMUSCULAR | Status: DC | PRN
  Administered 2018-03-12: 4 mg via INTRAVENOUS

## 2018-03-12 MED ORDER — LIDOCAINE 2% (20 MG/ML) 5 ML SYRINGE
INTRAMUSCULAR | Status: AC
  Filled 2018-03-12: qty 5

## 2018-03-12 MED ORDER — KETAMINE HCL 10 MG/ML IJ SOLN
INTRAMUSCULAR | Status: AC
  Filled 2018-03-12: qty 1

## 2018-03-12 MED ORDER — ONDANSETRON HCL 4 MG/2ML IJ SOLN
INTRAMUSCULAR | Status: AC
  Filled 2018-03-12: qty 2

## 2018-03-12 MED ORDER — ACETAMINOPHEN 10 MG/ML IV SOLN: 1000.0000 mg | Freq: Once | INTRAVENOUS | Status: DC | PRN

## 2018-03-12 MED ORDER — HYDRALAZINE HCL 20 MG/ML IJ SOLN
10.0000 mg | Freq: Three times a day (TID) | INTRAMUSCULAR | Status: DC | PRN
  Administered 2018-03-13 – 2018-03-16 (×4): 10 mg via INTRAVENOUS
  Filled 2018-03-12 (×4): qty 1

## 2018-03-12 MED ORDER — INSULIN PUMP
Freq: Three times a day (TID) | SUBCUTANEOUS | Status: DC
  Administered 2018-03-12 – 2018-03-18 (×13): via SUBCUTANEOUS
  Administered 2018-03-18: 0.9 via SUBCUTANEOUS
  Administered 2018-03-19: 08:00:00 via SUBCUTANEOUS
  Administered 2018-03-20: 0.5 via SUBCUTANEOUS
  Administered 2018-03-20: 1.3 via SUBCUTANEOUS
  Administered 2018-03-20: 0.3 via SUBCUTANEOUS
  Administered 2018-03-21 – 2018-03-22 (×3): via SUBCUTANEOUS
  Filled 2018-03-12: qty 1

## 2018-03-12 MED ORDER — HYDROMORPHONE HCL 2 MG/ML IJ SOLN
INTRAMUSCULAR | Status: AC
  Administered 2018-03-12: 0.5 mg via INTRAVENOUS
  Filled 2018-03-12: qty 1

## 2018-03-12 MED ORDER — PROMETHAZINE HCL 25 MG/ML IJ SOLN: 6.2500 mg | INTRAMUSCULAR | Status: DC | PRN

## 2018-03-12 MED ORDER — MIDAZOLAM HCL 2 MG/2ML IJ SOLN
INTRAMUSCULAR | Status: AC
  Filled 2018-03-12: qty 2

## 2018-03-12 MED ORDER — DEXAMETHASONE SODIUM PHOSPHATE 10 MG/ML IJ SOLN
INTRAMUSCULAR | Status: DC | PRN
  Administered 2018-03-12: 5 mg via INTRAVENOUS

## 2018-03-12 MED ORDER — ROCURONIUM BROMIDE 100 MG/10ML IV SOLN
INTRAVENOUS | Status: DC | PRN
  Administered 2018-03-12: 50 mg via INTRAVENOUS
  Administered 2018-03-12: 20 mg via INTRAVENOUS

## 2018-03-12 MED ORDER — PROPOFOL 10 MG/ML IV BOLUS
INTRAVENOUS | Status: AC
  Filled 2018-03-12: qty 20

## 2018-03-12 MED ORDER — FENTANYL CITRATE (PF) 250 MCG/5ML IJ SOLN
INTRAMUSCULAR | Status: DC | PRN
  Administered 2018-03-12: 50 ug via INTRAVENOUS

## 2018-03-12 MED ORDER — BACITRACIN ZINC 500 UNIT/GM EX OINT
TOPICAL_OINTMENT | CUTANEOUS | Status: DC | PRN
  Administered 2018-03-12: 1 via TOPICAL

## 2018-03-12 MED ORDER — LIDOCAINE 2% (20 MG/ML) 5 ML SYRINGE
INTRAMUSCULAR | Status: DC | PRN
  Administered 2018-03-12: 60 mg via INTRAVENOUS

## 2018-03-12 MED ORDER — ROCURONIUM BROMIDE 10 MG/ML (PF) SYRINGE
PREFILLED_SYRINGE | INTRAVENOUS | Status: AC
  Filled 2018-03-12: qty 5

## 2018-03-12 MED ORDER — DEXTROSE 50 % IV SOLN: 1.0000 | Freq: Once | INTRAVENOUS | Status: DC

## 2018-03-12 MED ORDER — MEPERIDINE HCL 50 MG/ML IJ SOLN: 6.2500 mg | INTRAMUSCULAR | Status: DC | PRN

## 2018-03-12 MED ORDER — SUGAMMADEX SODIUM 200 MG/2ML IV SOLN
INTRAVENOUS | Status: DC | PRN
  Administered 2018-03-12: 181.4 mg via INTRAVENOUS

## 2018-03-12 MED ORDER — PROPOFOL 10 MG/ML IV BOLUS
INTRAVENOUS | Status: DC | PRN
  Administered 2018-03-12: 150 mg via INTRAVENOUS

## 2018-03-12 MED ORDER — 0.9 % SODIUM CHLORIDE (POUR BTL) OPTIME
TOPICAL | Status: DC | PRN
  Administered 2018-03-12: 1000 mL

## 2018-03-12 MED ORDER — LACTATED RINGERS IV SOLN
INTRAVENOUS | Status: DC
  Administered 2018-03-12: 11:00:00 via INTRAVENOUS

## 2018-03-12 MED ORDER — MIDAZOLAM HCL 5 MG/5ML IJ SOLN
INTRAMUSCULAR | Status: DC | PRN
  Administered 2018-03-12: 2 mg via INTRAVENOUS

## 2018-03-12 MED ORDER — BACITRACIN ZINC 500 UNIT/GM EX OINT
TOPICAL_OINTMENT | CUTANEOUS | Status: AC
  Filled 2018-03-12: qty 28.35

## 2018-03-12 MED ORDER — HYDROMORPHONE HCL 2 MG/ML IJ SOLN
0.3000 mg | INTRAMUSCULAR | Status: DC | PRN
  Administered 2018-03-12: 0.5 mg via INTRAVENOUS

## 2018-03-12 SURGICAL SUPPLY — 49 items
BANDAGE ACE 4X5 VEL STRL LF (GAUZE/BANDAGES/DRESSINGS) ×3 IMPLANT
BANDAGE ACE 6X5 VEL STRL LF (GAUZE/BANDAGES/DRESSINGS) ×3 IMPLANT
BIT DRILL 3.3 LONG (BIT) ×3 IMPLANT
BIT DRILL QC 3.3X195 (BIT) ×3 IMPLANT
BNDG COHESIVE 4X5 TAN STRL (GAUZE/BANDAGES/DRESSINGS) ×3 IMPLANT
BNDG GAUZE ELAST 4 BULKY (GAUZE/BANDAGES/DRESSINGS) ×6 IMPLANT
BRUSH SCRUB SURG 4.25 DISP (MISCELLANEOUS) ×3 IMPLANT
CAP LOCK NCB (Cap) ×21 IMPLANT
DRAPE C-ARM 42X72 X-RAY (DRAPES) ×3 IMPLANT
DRAPE C-ARMOR (DRAPES) ×3 IMPLANT
DRAPE HALF SHEET 40X57 (DRAPES) ×3 IMPLANT
DRAPE INCISE IOBAN 66X45 STRL (DRAPES) ×3 IMPLANT
DRAPE U-SHAPE 47X51 STRL (DRAPES) ×3 IMPLANT
DRSG ADAPTIC 3X8 NADH LF (GAUZE/BANDAGES/DRESSINGS) ×3 IMPLANT
GAUZE SPONGE 4X4 12PLY STRL (GAUZE/BANDAGES/DRESSINGS) ×3 IMPLANT
GLOVE BIO SURGEON STRL SZ7.5 (GLOVE) ×3 IMPLANT
GLOVE BIOGEL PI IND STRL 8 (GLOVE) ×1 IMPLANT
GLOVE BIOGEL PI INDICATOR 8 (GLOVE) ×2
GLOVE PROGUARD SZ 7 (GLOVE) ×3 IMPLANT
GLOVE PROGUARD SZ 7 1/2 (GLOVE) ×2 IMPLANT
GOWN STRL REUS W/ TWL LRG LVL3 (GOWN DISPOSABLE) ×1 IMPLANT
GOWN STRL REUS W/ TWL XL LVL3 (GOWN DISPOSABLE) ×1 IMPLANT
GOWN STRL REUS W/TWL LRG LVL3 (GOWN DISPOSABLE) ×2
GOWN STRL REUS W/TWL XL LVL3 (GOWN DISPOSABLE) ×2
K-WIRE 2.0 (WIRE) ×4
K-WIRE FXSTD 280X2XNS SS (WIRE) ×2
KIT BASIN OR (CUSTOM PROCEDURE TRAY) ×3 IMPLANT
KIT TURNOVER KIT B (KITS) ×3 IMPLANT
KWIRE FXSTD 280X2XNS SS (WIRE) ×2 IMPLANT
MANIFOLD NEPTUNE II (INSTRUMENTS) ×3 IMPLANT
NS IRRIG 1000ML POUR BTL (IV SOLUTION) ×3 IMPLANT
PACK ORTHO EXTREMITY (CUSTOM PROCEDURE TRAY) ×3 IMPLANT
PAD ARMBOARD 7.5X6 YLW CONV (MISCELLANEOUS) ×3 IMPLANT
PLATE LAT LOCK PA 192 9H LT (Plate) ×3 IMPLANT
SCREW NCB 4.0 28MM (Screw) ×3 IMPLANT
SCREW NCB 4.0 32MM (Screw) ×3 IMPLANT
SCREW NCB 4.0MX30M (Screw) ×3 IMPLANT
SCREW NCB 4.0X75 CORT S/T (Screw) ×6 IMPLANT
SCREW NCB 4X3 4X70 (Screw) ×3 IMPLANT
SCREW PROX ST NCB 4X80 (Screw) ×3 IMPLANT
STOCKINETTE IMPERVIOUS LG (DRAPES) ×3 IMPLANT
SUT ETHILON 3 0 PS 1 (SUTURE) ×3 IMPLANT
SUT VIC AB 0 CT1 27 (SUTURE) ×2
SUT VIC AB 0 CT1 27XBRD ANBCTR (SUTURE) ×1 IMPLANT
SUT VIC AB 1 CT1 27 (SUTURE) ×2
SUT VIC AB 1 CT1 27XBRD ANBCTR (SUTURE) ×1 IMPLANT
SUT VIC AB 2-0 CT1 27 (SUTURE) ×2
SUT VIC AB 2-0 CT1 TAPERPNT 27 (SUTURE) ×1 IMPLANT
YANKAUER SUCT BULB TIP NO VENT (SUCTIONS) ×3 IMPLANT

## 2018-03-12 NOTE — Progress Notes (Signed)
PROGRESS NOTE    Hannah GellDonna Eddins  ZOX:[id number] DOB: 01/17/1956 DOA: 03/11/2018 PCP: Shellia CleverlyBeane, Lori M, PA   Brief Narrative: Hannah Valentine is a very pleasant 62 y.o. female with medical history significant for HTN, CKD stage III, the patient has a history of kidney transplant in 2017, hyperlipidemia, diabetes on insulin pump, who was brought by the EMS, says she suffered a mechanical fall.  In review, the patient tripped over her dog, sustaining an injury on the left leg, when she fell she had broken.  She could not get up.  She localized the pain at the area of the fracture.  She denies any other areas of pain.  She did not hit her head.  She denies any nausea, vomiting, chest pain or palpitations.  She denies any shortness of breath or cough.     Currently, the pain is about 5 out of 10. X-rays demonstrated left tibial and fibular fracture.  Orthopedic consultation was obtained, and recommended a CT of the knee to rule out articular extension, which confirmed a comminuted tibial metadiaphyseal fracture, with impaction and up to 15 mm of lateral displacement, without significant angulation.  There is no proximal extension of this fracture.  No intra-articular abnormality seen at the tibiofemoral joint.    Assessment & Plan:   Principal Problem:   Left tibial fracture Active Problems:   Fall   History of kidney transplant   Essential hypertension   HLD (hyperlipidemia)   CKD (chronic kidney disease), stage III (HCC)   DM (diabetes mellitus), type 1 (HCC)   Left tibial fracture;  OR today.   DM type 1; On insulin pump.  DM coordinator helping with pump./  CBG every 2 hours.  Basal rate decrease by 30 %/   Acute on CKD stage III,  status post renal transplant 2017, on oral immunosuppressant therapy. baseline, at 1.6 Continue prednisone, Bactrim, Prograf Increase IV fluids.  Hold lasix, Norvasc.  Avoid hypotension, nephrotoxin.   HTN; hold Norvasc, hydralazine, imdur.       DVT  prophylaxis: per Ortho  Code Status; full code.  Family Communication: care discussed with patient.  Disposition Plan: to be determine    Consultants:   ortho    Procedures:   none   Antimicrobials:  Ancef prophylaxis.    Subjective: She is feeling ok, just came from OR.  She denies chest pain or dyspnea.    Objective: Vitals:   03/11/18 2052 03/11/18 2057 03/11/18 2100 03/12/18 0608  BP: (!) 158/49 (!) 158/49 (!) 156/55 (!) 159/62  Pulse: 68 67 61 63  Resp: 15  15   Temp: 98.3 F (36.8 C) 98.3 F (36.8 C) 98.2 F (36.8 C) 98.2 F (36.8 C)  TempSrc:  Oral Oral Oral  SpO2: 97% 97% 99% 98%  Weight:      Height:        Intake/Output Summary (Last 24 hours) at 03/12/2018 0741 Last data filed at 03/11/2018 2039 Gross per 24 hour  Intake 355 ml  Output 900 ml  Net -545 ml   Filed Weights   03/11/18 1000  Weight: 90.7 kg (200 lb)    Examination:  General exam: Appears calm and comfortable  Respiratory system: Clear to auscultation. Respiratory effort normal. Cardiovascular system: S1 & S2 heard, RRR. No JVD, murmurs, rubs, gallops or clicks. No pedal edema. Gastrointestinal system: Abdomen is nondistended, soft and nontender. No organomegaly or masses felt. Normal bowel sounds heard. Central nervous system: Alert and oriented. No focal neurological deficits.  Extremities: Left LE with dressing.  Skin: No rashes, lesions or ulcers Psychiatry: Judgement and insight appear normal. Mood & affect appropriate.     Data Reviewed: I have personally reviewed following labs and imaging studies  CBC: Recent Labs  Lab 03/11/18 1103  WBC 7.2  NEUTROABS 5.5  HGB 14.0  HCT 43.9  MCV 96.7  PLT 182   Basic Metabolic Panel: Recent Labs  Lab 03/11/18 1103  NA 142  K 3.7  CL 109  CO2 24  GLUCOSE 89  BUN 36*  CREATININE 1.84*  CALCIUM 8.5*   GFR: Estimated Creatinine Clearance: 36.6 mL/min (A) (by C-G formula based on SCr of 1.84 mg/dL (H)). Liver  Function Tests: No results for input(s): AST, ALT, ALKPHOS, BILITOT, PROT, ALBUMIN in the last 168 hours. No results for input(s): LIPASE, AMYLASE in the last 168 hours. No results for input(s): AMMONIA in the last 168 hours. Coagulation Profile: Recent Labs  Lab 03/11/18 1103  INR 1.08   Cardiac Enzymes: Recent Labs  Lab 03/11/18 1550  TROPONINI <0.03   BNP (last 3 results) No results for input(s): PROBNP in the last 8760 hours. HbA1C: Recent Labs    03/11/18 1818  HGBA1C 5.8*   CBG: Recent Labs  Lab 03/11/18 1725 03/11/18 1945 03/12/18 0608  GLUCAP 81 95 50*   Lipid Profile: No results for input(s): CHOL, HDL, LDLCALC, TRIG, CHOLHDL, LDLDIRECT in the last 72 hours. Thyroid Function Tests: No results for input(s): TSH, T4TOTAL, FREET4, T3FREE, THYROIDAB in the last 72 hours. Anemia Panel: No results for input(s): VITAMINB12, FOLATE, FERRITIN, TIBC, IRON, RETICCTPCT in the last 72 hours. Sepsis Labs: No results for input(s): PROCALCITON, LATICACIDVEN in the last 168 hours.  Recent Results (from the past 240 hour(s))  MRSA PCR Screening     Status: None   Collection Time: 03/11/18  6:21 PM  Result Value Ref Range Status   MRSA by PCR NEGATIVE NEGATIVE Final    Comment:        The GeneXpert MRSA Assay (FDA approved for NASAL specimens only), is one component of a comprehensive MRSA colonization surveillance program. It is not intended to diagnose MRSA infection nor to guide or monitor treatment for MRSA infections. Performed at [facility], [address], [place], [place] [address]          Radiology Studies: Dg Knee 1-2 Views Left  Result Date: 03/11/2018 CLINICAL DATA:  Fall with left leg deformity EXAM: LEFT KNEE - 1-2 VIEW COMPARISON:  None. FINDINGS: Tibial fracture at the metaphyseal junction with 13 mm of lateral displacement. Comminuted fracturing of the proximal fibula. The knee joint is located. Negative for knee joint effusion. Gas  may be present superficial to the tibial tuberosity, open fracture is radiographic consideration. IMPRESSION: 1. Displaced proximal tibial fracture without joint effusion or visible articular surface extension. 2. Comminuted proximal fibula. 3. Possible soft tissue gas at the tibial tuberosity. Electronically Signed   By: Marnee Spring M.D.   On: 03/11/2018 11:19   Dg Tibia/fibula Left  Result Date: 03/11/2018 CLINICAL DATA:  Fall, leg deformity and pain EXAM: LEFT TIBIA AND FIBULA - 2 VIEW COMPARISON:  None. FINDINGS: Comminuted fractures noted through the proximal left tibia and fibula with moderate displacement. No distal tibia or fibular abnormalities. No subluxation or dislocation within the left knee. IMPRESSION: Markedly comminuted and moderately displaced proximal left tibia and fibular metaphyseal fractures. Electronically Signed   By: Charlett Nose M.D.   On: 03/11/2018 11:17  Dg Ankle 2 Views Left  Result Date: 03/11/2018 CLINICAL DATA:  Fall, left leg deformity and pain EXAM: LEFT ANKLE - 2 VIEW COMPARISON:  None. FINDINGS: No acute bony abnormality. Specifically, no fracture, subluxation, or dislocation. IMPRESSION: No acute bony abnormality. Electronically Signed   By: Charlett Nose M.D.   On: 03/11/2018 11:17   Ct Knee Left Wo Contrast  Result Date: 03/11/2018 CLINICAL DATA:  Fall with leg deformity. Fractures of the proximal tibia and fibula on radiographs. EXAM: CT OF THE LEFT KNEE WITHOUT CONTRAST TECHNIQUE: Multidetector CT imaging of the left knee was performed according to the standard protocol. Multiplanar CT image reconstructions were also generated. COMPARISON:  Radiograph same date. FINDINGS: Bones/Joint/Cartilage Comminuted tibial metadiaphyseal fracture demonstrates impaction and up to 15 mm of lateral displacement. There is no significant angulation. There is no proximal extension of this fracture to the articular surface of the tibia. A small amount of gas is present within the  fracture. There are no specific signs of an open fracture. There is a comminuted and mildly displaced intra-articular fracture of the fibular head and neck. There is no widening of the proximal tibiofibular articulation. The distal femur and patella are intact. There is no significant knee joint effusion or gas in the joint. Ligaments Suboptimally assessed by CT. The cruciate ligaments appear intact at the knee. Muscles and Tendons The extensor mechanism is intact. No focal muscular fluid collections are seen. Soft tissues There is edema/hemorrhage in the subcutaneous fat of the lower leg anteriorly and medially. There is no focal hematoma. A small amount of subcutaneous gas is noted anterior to the tibial tubercle, well proximal to the tibial fracture. Femoropopliteal atherosclerosis noted. IMPRESSION: 1. Comminuted and laterally displaced extra-articular fracture of the proximal left tibial metadiaphysis. 2. Comminuted and mildly displaced intra-articular fracture of the left fibular head and neck. 3. No intra-articular abnormality seen at the tibiofemoral joint. Electronically Signed   By: Carey Bullocks M.D.   On: 03/11/2018 14:52   Dg Chest Port 1 View  Result Date: 03/11/2018 CLINICAL DATA:  Encounter for trauma EXAM: PORTABLE CHEST 1 VIEW COMPARISON:  10/03/2017 FINDINGS: The heart is mildly enlarged. Mediastinal contour is normal. There is atherosclerotic calcification of the thoracic aorta. The lungs are free of focal consolidations and pleural effusions. No pulmonary edema. No pneumothorax. There is streaky atelectasis or scarring at the LEFT lung base. No acute fracture. IMPRESSION: 1. Stable cardiomegaly. 2.  No evidence for acute  abnormality. 3.  Aortic atherosclerosis.  (ICD10-I70.0) Electronically Signed   By: Norva Pavlov M.D.   On: 03/11/2018 11:51   Dg Femur Min 2 Views Left  Result Date: 03/11/2018 CLINICAL DATA:  Fall, left lower leg deformity. EXAM: LEFT FEMUR 2 VIEWS COMPARISON:   None. FINDINGS: No femoral fracture, subluxation or dislocation. Hip joint and knee joint are intact. Proximal tibial and fibular fractures visualized, reported on knee and tib-fib series IMPRESSION: Proximal tibial and fibular fractures.  See knee and tib fib report. No femoral abnormality. Electronically Signed   By: Charlett Nose M.D.   On: 03/11/2018 11:16        Scheduled Meds: . amLODipine  5 mg Oral Daily  . doxazosin  4 mg Oral QPM  . ferrous sulfate  325 mg Oral Daily  . furosemide  80 mg Oral Daily  . hydrALAZINE  100 mg Oral Q8H  . insulin aspart  0-9 Units Subcutaneous TID WC  . insulin glargine  10 Units Subcutaneous QHS  . isosorbide  mononitrate  60 mg Oral BID  . metoprolol tartrate  50 mg Oral BID  . mycophenolate  360 mg Oral Daily   And  . mycophenolate  180 mg Oral QPM  . povidone-iodine  2 application Topical Once  . pravastatin  40 mg Oral QHS  . predniSONE  5 mg Oral QAC breakfast  . sulfamethoxazole-trimethoprim  1 tablet Oral Q M,W,F  . tacrolimus  2 mg Oral BID   Continuous Infusions: . sodium chloride 50 mL/hr at 03/11/18 1549  .  ceFAZolin (ANCEF) IV       LOS: 1 day    Time spent: 35 minutes.     Alba Cory, MD [facility] Pager [phone]  If 7PM-7AM, please contact night-coverage [email or web address] Password [facility] 03/12/2018, 7:41 AM

## 2018-03-12 NOTE — Progress Notes (Addendum)
Inpatient Diabetes Program Recommendations  AACE/ADA: New Consensus Statement on Inpatient Glycemic Control (2015)  Target Ranges:  Prepandial:   less than 140 mg/dL      Peak postprandial:   less than 180 mg/dL (1-2 hours)      Critically ill patients:  140 - 180 mg/dL   Lab Results  Component Value Date   GLUCAP 82 03/12/2018   HGBA1C 5.8 (H) 03/11/2018    Review of Glycemic ControlResults for Hannah Valentine, Hannah Valentine (MRN [id number]) as of 03/12/2018 08:46  Ref. Range 03/11/2018 17:25 03/11/2018 19:45 03/12/2018 06:08 03/12/2018 07:58  Glucose-Capillary Latest Ref Range: 65 - 99 mg/dL 81 95 50 (L) 82    Diabetes history: Type 1 DM  OutpatResults for Hannah Valentine, Hannah Valentine (MRN [id number]) as of 03/12/2018 08:46  Ref. Range 03/11/2018 17:25 03/11/2018 19:45 03/12/2018 06:08 03/12/2018 07:58  Glucose-Capillary Latest Ref Range: 65 - 99 mg/dL 81 95 50 (L) 82  Home Diabetes medications: Insulin pump: 630 G Basal Rate: Current Adjustment  00:00 0.825 02:00 am 0.65 0.5  05:00 am 0.575 06:00 am 0.7 08:30 am 1.35 02:00 pm 1.4 05:00 pm 1.10 09:00 pm 1.05  Carb Ratio:  00:00 16  12:00 pm 17   Insulin Sensitivity:  00:00 50  10:00 am 50 60  06:00 pm 50   BG Target:  00:00 120    Current orders for Inpatient glycemic control: Insulin pump- See settings above  Inpatient Diabetes Program Recommendations:    Note patient has insulin pump and Dexcom CGM.  She has had mild low this morning.  Insulin pump on suspend.  May need reduction in basal rates? If surgery is greater than 2 hours, may consider suspending insulin pump and start IV insulin until after surgery and patient alert and oriented.    Spoke with patient and she states that usually with surgery she reduces basal rates by 30%.  Discussed with MD and order received for q 2 hour blood sugars and reduction of basal rates by 30% until able to eat after surgery. Discussed with RN.   Thanks,  Beryl MeagerJenny Mirza Fessel, RN, BC-ADM Inpatient Diabetes Coordinator Pager  [phone] (8a-5p)   13:55p-Addendum:  Note total basal is 24.875 units/24 hours. Text paged MD regarding d/c of Lantus and Novolog since patient on insulin pump.

## 2018-03-12 NOTE — Transfer of Care (Signed)
Immediate Anesthesia Transfer of Care Note  Patient: Hannah Valentine  Procedure(s) Performed: OPEN REDUCTION INTERNAL FIXATION (ORIF) TIBIA/FIBULA FRACTURE (Left )  Patient Location: PACU  Anesthesia Type:General  Level of Consciousness: drowsy and patient cooperative  Airway & Oxygen Therapy: Patient Spontanous Breathing and Patient connected to face mask oxygen  Post-op Assessment: Report given to RN, Post -op Vital signs reviewed and stable and Patient moving all extremities X 4  Post vital signs: Reviewed and stable  Last Vitals:  Vitals Value Taken Time  BP 156/62 03/12/2018  1:14 PM  Temp    Pulse 62 03/12/2018  1:17 PM  Resp 12 03/12/2018  1:17 PM  SpO2 98 % 03/12/2018  1:17 PM  Vitals shown include unvalidated device data.  Last Pain:  Vitals:   03/12/18 0831  TempSrc:   PainSc: 6       Patients Stated Pain Goal: 3 (03/12/18 0831)  Complications: No apparent anesthesia complications

## 2018-03-12 NOTE — Progress Notes (Signed)
CRNA reports has insulin pump set at 30% of basal rate

## 2018-03-12 NOTE — Progress Notes (Signed)
Pt states she is a type 1 diabetic

## 2018-03-12 NOTE — Plan of Care (Signed)
  Problem: Coping: Goal: Level of anxiety will decrease Outcome: Progressing   Problem: Pain Managment: Goal: General experience of comfort will improve Outcome: Progressing   Problem: Safety: Goal: Ability to remain free from injury will improve Outcome: Progressing   Problem: Skin Integrity: Goal: Risk for impaired skin integrity will decrease Outcome: Progressing   

## 2018-03-12 NOTE — Progress Notes (Signed)
Orthopedic Tech Progress Note Patient Details:  Hannah Valentine 03/19/1956 [id number]  Patient ID: Hannah Valentine, female   DOB: 07/24/1956, 62 y.o.   MRN: [id number]   Charlott HollerJennifer C KoontzCalled [employer] for right hinged knee brace. 03/12/2018, 3:34 PM

## 2018-03-12 NOTE — Anesthesia Procedure Notes (Signed)
Procedure Name: Intubation Date/Time: 03/12/2018 11:27 AM Performed by: Freddie Breech, CRNA Pre-anesthesia Checklist: Patient identified, Emergency Drugs available, Suction available and Patient being monitored Patient Re-evaluated:Patient Re-evaluated prior to induction Oxygen Delivery Method: Circle System Utilized Preoxygenation: Pre-oxygenation with 100% oxygen Induction Type: IV induction Ventilation: Mask ventilation without difficulty Laryngoscope Size: Mac and 3 Grade View: Grade III Tube type: Oral Tube size: 7.0 mm Number of attempts: 1 Airway Equipment and Method: Stylet and Oral airway Placement Confirmation: ETT inserted through vocal cords under direct vision,  positive ETCO2 and breath sounds checked- equal and bilateral Secured at: 21 cm Tube secured with: Tape Dental Injury: Teeth and Oropharynx as per pre-operative assessment

## 2018-03-12 NOTE — Progress Notes (Signed)
Pt ready for surgery, report called to short stay.

## 2018-03-13 LAB — GLUCOSE, CAPILLARY
GLUCOSE-CAPILLARY: 128 mg/dL — AB (ref 65–99)
GLUCOSE-CAPILLARY: 118 mg/dL — AB (ref 65–99)
GLUCOSE-CAPILLARY: 128 mg/dL — AB (ref 65–99)
GLUCOSE-CAPILLARY: 58 mg/dL — AB (ref 65–99)
GLUCOSE-CAPILLARY: 66 mg/dL (ref 65–99)
Glucose-Capillary: 76 mg/dL (ref 65–99)

## 2018-03-13 LAB — BASIC METABOLIC PANEL
ANION GAP: 11 (ref 5–15)
BUN: 28 mg/dL — ABNORMAL HIGH (ref 6–20)
CALCIUM: 8.3 mg/dL — AB (ref 8.9–10.3)
CO2: 22 mmol/L (ref 22–32)
Chloride: 102 mmol/L (ref 101–111)
Creatinine, Ser: 1.62 mg/dL — ABNORMAL HIGH (ref 0.44–1.00)
GFR, EST AFRICAN AMERICAN: 38 mL/min — AB (ref >60)
GFR, EST NON AFRICAN AMERICAN: 33 mL/min — AB (ref >60)
Glucose, Bld: 144 mg/dL — ABNORMAL HIGH (ref 65–99)
Potassium: 3.7 mmol/L (ref 3.5–5.1)
SODIUM: 135 mmol/L (ref 135–145)

## 2018-03-13 LAB — CBC
HEMATOCRIT: 37.3 % (ref 36.0–46.0)
HEMOGLOBIN: 11.6 g/dL — AB (ref 12.0–15.0)
MCH: 30.9 pg (ref 26.0–34.0)
MCHC: 31.1 g/dL (ref 30.0–36.0)
MCV: 99.2 fL (ref 78.0–100.0)
Platelets: 152 10*3/uL (ref 150–400)
RBC: 3.76 MIL/uL — AB (ref 3.87–5.11)
RDW: 14.5 % (ref 11.5–15.5)
WBC: 8.7 10*3/uL (ref 4.0–10.5)

## 2018-03-13 MED ORDER — DOXAZOSIN MESYLATE 4 MG PO TABS
4.0000 mg | ORAL_TABLET | Freq: Every evening | ORAL | Status: DC
  Administered 2018-03-13 – 2018-03-22 (×9): 4 mg via ORAL
  Filled 2018-03-13 (×4): qty 1
  Filled 2018-03-13 (×2): qty 2
  Filled 2018-03-13: qty 1
  Filled 2018-03-13: qty 2
  Filled 2018-03-13 (×3): qty 1

## 2018-03-13 MED ORDER — HEPARIN SODIUM (PORCINE) 5000 UNIT/ML IJ SOLN
5000.0000 [IU] | Freq: Three times a day (TID) | INTRAMUSCULAR | Status: DC
  Filled 2018-03-13 (×3): qty 1

## 2018-03-13 MED ORDER — ISOSORBIDE MONONITRATE ER 60 MG PO TB24
60.0000 mg | ORAL_TABLET | Freq: Two times a day (BID) | ORAL | Status: DC
  Administered 2018-03-13 – 2018-03-22 (×19): 60 mg via ORAL
  Filled 2018-03-13 (×19): qty 1

## 2018-03-13 MED ORDER — FUROSEMIDE 40 MG PO TABS
80.0000 mg | ORAL_TABLET | Freq: Every day | ORAL | Status: DC
  Administered 2018-03-13: 80 mg via ORAL
  Filled 2018-03-13: qty 2

## 2018-03-13 MED ORDER — AMLODIPINE BESYLATE 5 MG PO TABS
5.0000 mg | ORAL_TABLET | Freq: Every day | ORAL | Status: DC
  Administered 2018-03-13 – 2018-03-15 (×3): 5 mg via ORAL
  Filled 2018-03-13 (×2): qty 1

## 2018-03-13 NOTE — Evaluation (Signed)
Physical Therapy Evaluation Patient Details Name: Hannah GellDonna Karr MRN: [id number] DOB: 02/13/1956 Today's Date: 03/13/2018   History of Present Illness   62 y.o. female with medical history significant for HTN, CKD stage III with LUE fistula (pt reports cannot lift more than 5 lbs with left due to fistula is tenuous), the patient has a history of kidney transplant in 2017, hyperlipidemia, diabetes on insulin pump, who was brought by the EMS, says she suffered a mechanical fall.  In review, the patient tripped over her dog,and sustained left proximal tibia/fibula fracture; s/p ORIF 03/12/18    Clinical Impression  Patient is s/p above surgery resulting in functional limitations due to the deficits listed below (see PT Problem List). Patient reports she is not allowed to lift more than 5 lbs on her left arm due to dialysis fistula (trying to spare it in case needed in the future). With this restriction, she will need to use a wheelchair. Her home does have a lower level with level entry and full bathroom (no bed) and husband is going to measure the entrance into bathroom. She was scooting along EOB very well while PT maintained her LLE in NWB-TDWB and should do well with sliding board transfer. Patient will benefit from skilled PT to increase their independence and safety with mobility to allow discharge to the venue listed below.       Follow Up Recommendations CIR    Equipment Recommendations  Wheelchair (measurements PT);[facility]    Recommendations for Other Services Rehab consult     Precautions / Restrictions Precautions Precautions: Fall;Other (comment) Precaution Comments: pt reports renal instructed her no more than 5 lbs lifting with LUE to preserve fistula in case dialysis needed Required Braces or Orthoses: Other Brace/Splint Other Brace/Splint: hinged brace LLE (locked at 0) Restrictions Weight Bearing Restrictions: Yes LLE Weight Bearing: Touchdown  weight bearing Other Position/Activity Restrictions: see precautions re: LUE      Mobility  Bed Mobility Overal bed mobility: Needs Assistance Bed Mobility: Supine to Sit;Sit to Supine     Supine to sit: Mod assist;HOB elevated Sit to supine: Mod assist;+2 for safety/equipment   General bed mobility comments: if LLE elevated with mod assist, pt can then scoot laterally in supine; PT managed her leg while she was able to raise/lower her torso in/out of bed  Transfers Overall transfer level: Needs assistance Equipment used: None Transfers: Lateral/Scoot Transfers          Lateral/Scoot Transfers: Mod assist General transfer comment: pt able to scoot rt along EOB x 18" and then back to her left 18" with PT holding leg to assure TDWB; became nauseated (feels due to pain meds) and ultimately vomiting while seated EOB; RN in to assist, provide meds  Ambulation/Gait                Stairs            Wheelchair Mobility    Modified Rankin (Stroke Patients Only)       Balance Overall balance assessment: (can maintain sitting EOB without UE support and leg TDWB)                                           Pertinent Vitals/Pain Pain Assessment: 0-10 Pain Score: 3  Pain Location: LLE Pain Descriptors / Indicators: Guarding;Operative site guarding Pain Intervention(s): Limited activity within patient's tolerance;Monitored during  session    Home Living Family/patient expects to be discharged to:: Private residence Living Arrangements: Spouse/significant other Available Help at Discharge: Family;Available 24 hours/day Type of Home: House Home Access: Level entry(into garage )     Home Layout: Multi-level(garage level has full bathroom but no bed) Home Equipment: None      Prior Function Level of Independence: Independent         Comments: retired     Higher education careers adviser Dominance   Dominant Hand: Right    Extremity/Trunk Assessment   Upper  Extremity Assessment Upper Extremity Assessment: (reports sore rt wrist from fall; able to push/pull with PT)    Lower Extremity Assessment Lower Extremity Assessment: Generalized weakness;LLE deficits/detail LLE Deficits / Details: in long leg hinged brace locked at 0 degrees; requires assist to raise leg off bed LLE: Unable to fully assess due to pain;Unable to fully assess due to immobilization       Communication   Communication: No difficulties  Cognition Arousal/Alertness: Awake/alert Behavior During Therapy: WFL for tasks assessed/performed Overall Cognitive Status: Within Functional Limits for tasks assessed                                        General Comments      Exercises     Assessment/Plan    PT Assessment Patient needs continued PT services  PT Problem List Decreased strength;Decreased range of motion;Decreased activity tolerance;Decreased mobility;Decreased knowledge of use of DME;Obesity;Pain;Other (comment)(limited use of LUE due to fistula)       PT Treatment Interventions DME instruction;Functional mobility training;Therapeutic activities;Therapeutic exercise;Patient/family education;Wheelchair mobility training    PT Goals (Current goals can be found in the Care Plan section)  Acute Rehab PT Goals Patient Stated Goal: be able to move well enough to return home to stay in downstairs/garage level of home PT Goal Formulation: With patient/family Time For Goal Achievement: 03/27/18 Potential to Achieve Goals: Good    Frequency Min 3X/week   Barriers to discharge Decreased caregiver support(husband cannot provide level of assist she currently needs )      Co-evaluation               AM-PAC PT "6 Clicks" Daily Activity  Outcome Measure Difficulty turning over in bed (including adjusting bedclothes, sheets and blankets)?: A Lot Difficulty moving from lying on back to sitting on the side of the bed? : A Lot Difficulty sitting  down on and standing up from a chair with arms (e.g., wheelchair, bedside commode, etc,.)?: Unable Help needed moving to and from a bed to chair (including a wheelchair)?: Total Help needed walking in hospital room?: Total Help needed climbing 3-5 steps with a railing? : Total 6 Click Score: 8    End of Session Equipment Utilized During Treatment: Other (comment)(LLE hinged brace) Activity Tolerance: Treatment limited secondary to medical complications (Comment)(nausea and vomiting) Patient left: in bed;with call bell/phone within reach;with nursing/sitter in room;with family/visitor present Nurse Communication: Mobility status PT Visit Diagnosis: History of falling (Z91.81);Difficulty in walking, not elsewhere classified (R26.2);Pain Pain - Right/Left: Left Pain - part of body: Leg    Time: 1610-9604 PT Time Calculation (min) (ACUTE ONLY): 51 min   Charges:   PT Evaluation $PT Eval High Complexity: 1 High PT Treatments $Therapeutic Activity: 8-22 mins   PT G Codes:          [employer], PT 03/13/2018, 12:55 PM

## 2018-03-13 NOTE — Progress Notes (Signed)
PROGRESS NOTE    Hannah Valentine  ZOX:[id number] DOB: 1956-07-02 DOA: 03/11/2018 PCP: [name], PA   Brief Narrative: Hannah Valentine is a very pleasant 62 y.o. female with medical history significant for HTN, CKD stage III, the patient has a history of kidney transplant in 2017, hyperlipidemia, diabetes on insulin pump, who was brought by the EMS, says she suffered a mechanical fall.  In review, the patient tripped over her dog, sustaining an injury on the left leg, when she fell she had broken.  She could not get up.  She localized the pain at the area of the fracture.  She denies any other areas of pain.  She did not hit her head.  She denies any nausea, vomiting, chest pain or palpitations.  She denies any shortness of breath or cough.  Currently, the pain is about 5 out of 10. X-rays demonstrated left tibial and fibular fracture.  Orthopedic consultation was obtained, and recommended a CT of the knee to rule out articular extension, which confirmed a comminuted tibial metadiaphyseal fracture, with impaction and up to 15 mm of lateral displacement, without significant angulation.  There is no proximal extension of this fracture.  No intra-articular abnormality seen at the tibiofemoral joint.    Assessment & Plan:   Principal Problem:   Left tibial fracture Active Problems:   Fall   History of kidney transplant   Essential hypertension   HLD (hyperlipidemia)   CKD (chronic kidney disease), stage III (HCC)   DM (diabetes mellitus), type 1 (HCC)   Closed fracture of upper end of left fibula   Left tibial fracture;  S/P ORIF of left tibial shaft fracture.  Management per ORTHO.  DVT prophylaxis per ortho   DM type 1; On insulin pump.  DM coordinator helping with pump./  CBG Q 4 hours.  Basal rate decrease by 30 %/   Acute on CKD stage III,  status post renal transplant 2017, on oral immunosuppressant therapy. baseline, at 1.6 Continue prednisone, Bactrim, Prograf Resume  lasix,  Norvasc.  Avoid hypotension, nephrotoxin.  She is requesting her lasix. She understand that if cr increases tomorrow will have to hold lasix.  Will resume Cardura but with holder parameter. Resume imdur with holder parameter.     HTN; Resume Norvasc, imdur.   Hold  hydralazine,    DVT prophylaxis: per Ortho  Code Status; full code.  Family Communication: care discussed with patient.  Disposition Plan: to be determine    Consultants:   ortho    Procedures:   none   Antimicrobials:  Ancef prophylaxis.    Subjective: She is doing well, she want her lasix back. She will drink plenty of fluid.     Objective: Vitals:   03/13/18 0407 03/13/18 0700 03/13/18 1237 03/13/18 1433  BP: (!) 179/64 (!) 167/64  (!) 134/49  Pulse: 61   (!) 51  Resp: 15   17  Temp: 98.2 F (36.8 C)   98.2 F (36.8 C)  TempSrc: Oral   Oral  SpO2: 98%   95%  Weight:   99.5 kg (219 lb 5.7 oz)   Height:        Intake/Output Summary (Last 24 hours) at 03/13/2018 1553 Last data filed at 03/13/2018 1500 Gross per 24 hour  Intake 938 ml  Output 1300 ml  Net -362 ml   Filed Weights   03/11/18 1000 03/13/18 1237  Weight: 90.7 kg (200 lb) 99.5 kg (219 lb 5.7 oz)    Examination:  General exam: NAD Respiratory system: CTA Cardiovascular system: S 1, S 2 RRR Gastrointestinal system: BS present, soft, nt Central nervous system: non focal.  Extremities: Left LE with dressing.  Psychiatry: mood and affected appropriate     Data Reviewed: I have personally reviewed following labs and imaging studies  CBC: Recent Labs  Lab 03/11/18 1103 03/13/18 0331  WBC 7.2 8.7  NEUTROABS 5.5  --   HGB 14.0 11.6*  HCT 43.9 37.3  MCV 96.7 99.2  PLT 182 152   Basic Metabolic Panel: Recent Labs  Lab 03/11/18 1103 03/12/18 1004 03/13/18 0833  NA 142 139 135  K 3.7 4.0 3.7  CL 109 109 102  CO2 24 22 22   GLUCOSE 89 141* 144*  BUN 36* 35* 28*  CREATININE 1.84* 1.99* 1.62*  CALCIUM 8.5* 8.2*  8.3*   GFR: Estimated Creatinine Clearance: 43.7 mL/min (A) (by C-G formula based on SCr of 1.62 mg/dL (H)). Liver Function Tests: No results for input(s): AST, ALT, ALKPHOS, BILITOT, PROT, ALBUMIN in the last 168 hours. No results for input(s): LIPASE, AMYLASE in the last 168 hours. No results for input(s): AMMONIA in the last 168 hours. Coagulation Profile: Recent Labs  Lab 03/11/18 1103  INR 1.08   Cardiac Enzymes: Recent Labs  Lab 03/11/18 1550  TROPONINI <0.03   BNP (last 3 results) No results for input(s): PROBNP in the last 8760 hours. HbA1C: Recent Labs    03/11/18 1818  HGBA1C 5.8*   CBG: Recent Labs  Lab 03/12/18 1830 03/12/18 2018 03/13/18 0100 03/13/18 0418 03/13/18 1122  GLUCAP 96 147* 128* 118* 128*   Lipid Profile: No results for input(s): CHOL, HDL, LDLCALC, TRIG, CHOLHDL, LDLDIRECT in the last 72 hours. Thyroid Function Tests: No results for input(s): TSH, T4TOTAL, FREET4, T3FREE, THYROIDAB in the last 72 hours. Anemia Panel: No results for input(s): VITAMINB12, FOLATE, FERRITIN, TIBC, IRON, RETICCTPCT in the last 72 hours. Sepsis Labs: No results for input(s): PROCALCITON, LATICACIDVEN in the last 168 hours.  Recent Results (from the past 240 hour(s))  MRSA PCR Screening     Status: None   Collection Time: 03/11/18  6:21 PM  Result Value Ref Range Status   MRSA by PCR NEGATIVE NEGATIVE Final    Comment:        The GeneXpert MRSA Assay (FDA approved for NASAL specimens only), is one component of a comprehensive MRSA colonization surveillance program. It is not intended to diagnose MRSA infection nor to guide or monitor treatment for MRSA infections. Performed at [facility], [address] [place], [place] [address]          Radiology Studies: Dg Tibia/fibula Left  Result Date: 03/12/2018 CLINICAL DATA:  62 year old female with comminuted and displaced extra-articular fracture of the proximal left tibia and  intra-articular fracture of the proximal left fibula undergoing ORIF. EXAM: DG C-ARM 61-120 MIN; LEFT TIBIA AND FIBULA - 2 VIEW COMPARISON:  CT left knee 03/11/2018. FLUOROSCOPY TIME:  1 minutes 50 seconds FINDINGS: Five intraoperative fluoroscopic spot views of the proximal left tibia and fibula in the AP and lateral projection. On the final 3 images lateral plate and screw fixation of the proximal left tibia is demonstrated with improved alignment of fracture fragments. Hardware appears intact. The proximal left fibula fracture also appears improved on those images. IMPRESSION: ORIF proximal left tibia with no adverse features. Electronically Signed   By: Odessa FlemingH  Hall M.D.   On: 03/12/2018 13:11   Dg Tibia/fibula Left Port  Result  Date: 03/12/2018 CLINICAL DATA:  Patient status post fixation a left tibial fracture which the patient suffered in a fall 03/11/2018. Initial encounter. EXAM: PORTABLE LEFT TIBIA AND FIBULA - 2 VIEW COMPARISON:  CT left knee and plain films left knee 03/11/2018. FINDINGS: Lateral plate and screws are in place for fixation of a proximal tibial fracture. Position and alignment are markedly improved. Proximal fibular fracture noted. No acute finding. IMPRESSION: Status post ORIF of a left tibial fracture with marked improvement in position and alignment. No new abnormality. Electronically Signed   By: Drusilla Kanner M.D.   On: 03/12/2018 14:11   Dg C-arm 1-60 Min  Result Date: 03/12/2018 CLINICAL DATA:  62 year old female with comminuted and displaced extra-articular fracture of the proximal left tibia and intra-articular fracture of the proximal left fibula undergoing ORIF. EXAM: DG C-ARM 61-120 MIN; LEFT TIBIA AND FIBULA - 2 VIEW COMPARISON:  CT left knee 03/11/2018. FLUOROSCOPY TIME:  1 minutes 50 seconds FINDINGS: Five intraoperative fluoroscopic spot views of the proximal left tibia and fibula in the AP and lateral projection. On the final 3 images lateral plate and screw fixation  of the proximal left tibia is demonstrated with improved alignment of fracture fragments. Hardware appears intact. The proximal left fibula fracture also appears improved on those images. IMPRESSION: ORIF proximal left tibia with no adverse features. Electronically Signed   By: Odessa Fleming M.D.   On: 03/12/2018 13:11        Scheduled Meds: . amLODipine  5 mg Oral Daily  . doxazosin  4 mg Oral QPM  . ferrous sulfate  325 mg Oral Daily  . furosemide  80 mg Oral Daily  . heparin injection (subcutaneous)  5,000 Units Subcutaneous Q8H  . insulin pump   Subcutaneous TID AC & HS  . isosorbide mononitrate  60 mg Oral BID  . metoprolol tartrate  50 mg Oral BID  . mycophenolate  360 mg Oral Daily   And  . mycophenolate  180 mg Oral QPM  . pravastatin  40 mg Oral QHS  . predniSONE  5 mg Oral QAC breakfast  . sulfamethoxazole-trimethoprim  1 tablet Oral Q M,W,F  . tacrolimus  2 mg Oral BID   Continuous Infusions:    LOS: 2 days    Time spent: 35 minutes.     Alba Cory, MD [facility] Pager [phone]  If 7PM-7AM, please contact night-coverage [email or web address] Password [facility] 03/13/2018, 3:53 PM

## 2018-03-13 NOTE — Evaluation (Signed)
Occupational Therapy Evaluation Patient Details Name: Hannah GellDonna Mcentee MRN: [id number] DOB: 11/14/1955 Today's Date: 03/13/2018    History of Present Illness  62 y.o. female with medical history significant for HTN, CKD stage III with LUE fistula (pt reports cannot lift more than 5 lbs with left due to fistula is tenuous), the patient has a history of kidney transplant in 2017, hyperlipidemia, diabetes on insulin pump, who was brought by the EMS, says she suffered a mechanical fall.  In review, the patient tripped over her dog,and sustained left proximal tibia/fibula fracture; s/p ORIF 03/12/18   Clinical Impression   PTA, pt was living with her husband and was performing ADLs. Pt currently requiring Min A for UB ADLs, Max A for LB ADLs, and Mod A for lateral scoot to drop arm recliner. Pt highly motivated and wanting to participate in therapy despite fatigue and pain. Pt will require further acute OT to facilitate safe dc. Recommend dc to CIR for intensive OT to optimize safety and independence with ADLs and functional obility prior to transitioning home.     Follow Up Recommendations  CIR    Equipment Recommendations  3 in 1 bedside commode(Drop arm BSC)    Recommendations for Other Services Rehab consult;PT consult     Precautions / Restrictions Precautions Precautions: Fall;Other (comment) Precaution Comments: pt reports renal instructed her no more than 5 lbs lifting with LUE to preserve fistula in case dialysis needed Required Braces or Orthoses: Other Brace/Splint Other Brace/Splint: hinged brace LLE (locked at 0) Restrictions Weight Bearing Restrictions: Yes LLE Weight Bearing: Touchdown weight bearing Other Position/Activity Restrictions: see precautions re: LUE      Mobility Bed Mobility Overal bed mobility: Needs Assistance Bed Mobility: Supine to Sit;Sit to Supine     Supine to sit: Min assist Sit to supine: Mod assist;+2 for safety/equipment   General bed mobility  comments: Min A to manage LLE   Transfers Overall transfer level: Needs assistance Equipment used: None Transfers: Lateral/Scoot Transfers          Lateral/Scoot Transfers: Mod assist General transfer comment: Mod A to manage LLE and block R knee    Balance Overall balance assessment: Needs assistance Sitting-balance support: Feet unsupported;No upper extremity supported Sitting balance-Leahy Scale: Fair Sitting balance - Comments: Able to maintain static sitting.Requiring elevation of LLE                                   ADL either performed or assessed with clinical judgement   ADL Overall ADL's : Needs assistance/impaired Eating/Feeding: Set up;Sitting   Grooming: Set up;Sitting   Upper Body Bathing: Sitting;Minimal assistance   Lower Body Bathing: Maximal assistance;Sitting/lateral leans;Bed level   Upper Body Dressing : Sitting;Set up;Supervision/safety   Lower Body Dressing: Maximal assistance;Sitting/lateral leans;Bed level   Toilet Transfer: Moderate assistance;Requires drop arm(Lateral scoot to drop arm recliner) Toilet Transfer Details (indicate cue type and reason): Pt requiring Mod A to manage LLE to reduce pain. Pt demonstrating high motivation to laterally scoot to a drop arm recliner. Educating pt on use of drop arm commode for toileting          Functional mobility during ADLs: Moderate assistance(lateral scoots) General ADL Comments: Pt demonstrating high motivation to participate in therapy despite fatigue and pain. Pt performing lateral scoot to drop arm recliner with Mod A for LLE management. Requiring Min VCs for hand placement. Pt's husband very supportive and providing encouragement and  cues for scooting.     Vision Baseline Vision/History: Wears glasses Wears Glasses: Reading only Patient Visual Report: No change from baseline       Perception     Praxis      Pertinent Vitals/Pain Pain Assessment: Faces Pain Score: 3   Faces Pain Scale: Hurts even more Pain Location: LLE Pain Descriptors / Indicators: Guarding;Operative site guarding Pain Intervention(s): Monitored during session;Limited activity within patient's tolerance;Repositioned     Hand Dominance Right   Extremity/Trunk Assessment Upper Extremity Assessment Upper Extremity Assessment: RUE deficits/detail RUE Deficits / Details: Reports sore from fall   Lower Extremity Assessment Lower Extremity Assessment: Defer to PT evaluation LLE Deficits / Details: in long leg hinged brace locked at 0 degrees; requires assist to raise leg off bed LLE: Unable to fully assess due to pain;Unable to fully assess due to immobilization       Communication Communication Communication: No difficulties   Cognition Arousal/Alertness: Awake/alert Behavior During Therapy: WFL for tasks assessed/performed Overall Cognitive Status: Within Functional Limits for tasks assessed                                 General Comments: highly motivated to participate in therapy   General Comments  Husband present throughout session    Exercises     Shoulder Instructions      Home Living Family/patient expects to be discharged to:: Private residence Living Arrangements: Spouse/significant other Available Help at Discharge: Family;Available 24 hours/day Type of Home: House Home Access: Level entry(into garage )     Home Layout: Multi-level(garage level has full bathroom but no bed) Alternate Level Stairs-Number of Steps: 12 Alternate Level Stairs-Rails: Right Bathroom Shower/Tub: Estate manager/land agent Accessibility: (not sure; entrance to bathroom in garage may be tricky)   Home Equipment: None          Prior Functioning/Environment Level of Independence: Independent        Comments: retired        OT Problem List: Decreased strength;Decreased range of motion;Decreased activity tolerance;Impaired balance (sitting and/or  standing);Decreased safety awareness;Decreased knowledge of use of DME or AE;Decreased knowledge of precautions;Pain      OT Treatment/Interventions: Self-care/ADL training;Therapeutic exercise;Energy conservation;DME and/or AE instruction;Therapeutic activities;Patient/family education    OT Goals(Current goals can be found in the care plan section) Acute Rehab OT Goals Patient Stated Goal: be able to move well enough to return home to stay in downstairs/garage level of home OT Goal Formulation: With patient Time For Goal Achievement: 03/27/18 Potential to Achieve Goals: Good ADL Goals Pt Will Perform Lower Body Dressing: with adaptive equipment;sitting/lateral leans;with modified independence Pt Will Transfer to Toilet: bedside commode;with transfer board;with modified independence(drop arm) Pt Will Perform Toileting - Clothing Manipulation and hygiene: with modified independence;sitting/lateral leans(with or without AE) Additional ADL Goal #1: Pt will perform bed mobility with supervision in preparation for ADLs  OT Frequency: Min 3X/week   Barriers to D/C:            Co-evaluation              AM-PAC PT "6 Clicks" Daily Activity     Outcome Measure Help from another person eating meals?: A Little Help from another person taking care of personal grooming?: A Little Help from another person toileting, which includes using toliet, bedpan, or urinal?: A Lot Help from another person bathing (including washing, rinsing, drying)?: A Lot Help from  another person to put on and taking off regular upper body clothing?: A Little Help from another person to put on and taking off regular lower body clothing?: A Lot 6 Click Score: 15   End of Session Nurse Communication: Mobility status;Precautions;Weight bearing status  Activity Tolerance: Patient tolerated treatment well Patient left: in chair;with call bell/phone within reach;with family/visitor present  OT Visit Diagnosis:  Unsteadiness on feet (R26.81);Other abnormalities of gait and mobility (R26.89);Muscle weakness (generalized) (M62.81);Pain Pain - Right/Left: Left Pain - part of body: Leg                Time: 1610-9604 OT Time Calculation (min): 26 min Charges:  OT General Charges $OT Visit: 1 Visit OT Evaluation $OT Eval Moderate Complexity: 1 Mod OT Treatments $Self Care/Home Management : 8-22 mins G-Codes:     Ryli Standlee MSOT, OTR/L Acute Rehab Pager: [phone] Office: [phone]  Theodoro Grist Lorelee Mclaurin 03/13/2018, 3:34 PM

## 2018-03-13 NOTE — Progress Notes (Signed)
Orthopaedic Trauma Progress Note  S: Patient doing well this morning pain is better controlled.  Creatinine has improved overnight.  No chest pain or shortness of breath no other issues of note today.  O:  Vitals:   03/13/18 0407 03/13/18 0700  BP: (!) 179/64 (!) 167/64  Pulse: 61   Resp: 15   Temp: 98.2 F (36.8 C)   SpO2: 98%    Imaging: Stable postop imaging  Labs:  CBC    Component Value Date/Time   WBC 8.7 03/13/2018 0331   RBC 3.76 (L) 03/13/2018 0331   HGB 11.6 (L) 03/13/2018 0331   HCT 37.3 03/13/2018 0331   PLT 152 03/13/2018 0331   MCV 99.2 03/13/2018 0331   MCH 30.9 03/13/2018 0331   MCHC 31.1 03/13/2018 0331   RDW 14.5 03/13/2018 0331   LYMPHSABS 1.0 03/11/2018 1103   MONOABS 0.5 03/11/2018 1103   EOSABS 0.2 03/11/2018 1103   BASOSABS 0.1 03/11/2018 110693    A/P: 62 year old female with a history of kidney transplant presents status post ORIF of left proximal tibial shaft fracture  Weightbearing: TDWB LLE Insicional and dressing care: We will change dressings tomorrow Orthopedic device(s): Hinged knee brace unlocked Showering: Not yet VTE prophylaxis: Recommend heparin subcutaneous due to kidney issues. Pain control: Oxycodone and hydrocodone Follow - up plan: 2 weeks  Roby LoftsKevin P. Mikell Camp, MD [facility] [phone] (phone)

## 2018-03-13 NOTE — Op Note (Signed)
OrthopaedicSurgeryOperativeNote (ZOX:[id number]) Date of Surgery: 03/12/2018  Admit Date: 03/11/2018   Diagnoses: Pre-Op Diagnoses: Left proximal tibial shaft fracture  Post-Op Diagnosis: Same  Procedures: CPT 27758-Open reduction internal fixation of left proximal tibia fracture  Surgeons: Primary: Roby Lofts, MD   Location:[facility] OR ROOM 03   AnesthesiaChoice   Antibiotics:Ancef 2g preop   Tourniquettime:None  EstimatedBloodLoss:75 mL   Complications:None  Specimens:None  Implants: Implant Name Type Inv. Item Serial No. Manufacturer Lot No. LRB No. Used Action  PLATE LAT LOCK PA 192 9H LT - [id number] Plate PLATE LAT LOCK PA 192 9H LT  [employer](ORTH,TRAU,BIO,SG)  Left 1 Implanted  SCREW NCB 4.0X75 CORT S/T - [id number] Screw SCREW NCB 4.0X75 CORT S/T  [employer](ORTH,TRAU,BIO,SG)  Left 2 Implanted  SCREW NCB 4X3 4X70 - [id number] Screw SCREW NCB 4X3 4X70  [employer](ORTH,TRAU,BIO,SG)  Left 1 Implanted  SCREW NCB 4.0 - [id number] Screw SCREW NCB 4.0  [employer](ORTH,TRAU,BIO,SG)  Left 1 Implanted  SCREW NCB 4.0MX30M - [id number] Screw SCREW NCB 4.0MX30M  [employer](ORTH,TRAU,BIO,SG)  Left 1 Implanted  SCREW NCB 4.0 - [id number] Screw SCREW NCB 4.0  [employer](ORTH,TRAU,BIO,SG)  Left 1 Implanted  CAP LOCK NCB - [id number] Cap CAP LOCK NCB  [employer](ORTH,TRAU,BIO,SG)  Left 7 Implanted  4.0 mm NCB Screw 80mm      Left 1 Implanted    IndicationsforSurgery: 62 year old female with a history of renal transplant and insulin-dependent diabetes presents with a left proximal tibial shaft fracture.  She had fallen after her dog yanked her her leg against a tree.  Immediate pain and deformity and inability to bear weight.  She was brought to the emergency room where x-rays showed a left proximal tibial shaft fracture.  Patient is ambulatory without assist device at baseline.  I felt that trying to immobilize the leg with nonoperative  treatment would be at a significant risk for displacement and malalignment.  I felt that proceeding with surgical fixation is most appropriate.  Feel that open reduction internal fixation with a plate would be most appropriate.  Risks and benefits were discussed. Risks discussed included bleeding requiring blood transfusion, bleeding causing a hematoma, infection, malunion, nonunion, damage to surrounding nerves and blood vessels, pain, hardware prominence or irritation, hardware failure, stiffness, post-traumatic arthritis, DVT/PE, compartment syndrome, and even death.  The patient agrees to proceed with surgery and consent was obtained.  Operative Findings: Limited open reduction internal fixation using [employer] proximal tibial NCB plate 9 hole  Procedure: The patient was identified in the preoperative holding area. Consent was confirmed with the patient and their family and all questions were answered. The operative extremity was marked after confirmation with the patient. The patient was then brought back to the operating room by our anesthesia colleagues.  They were carefully transferred over to a radiolucent flat top table.  They were placed under general anesthetic.  A bump was placed in the operative hip.  A bone foam was placed under the leg.  The operative extremity was then prepped and draped in usual sterile fashion. A preoperative timeout was performed to verify the patient, the procedure, and the extremity. Preoperative antibiotics were dosed.  Fluoroscopic images were obtained to show the instability of the fracture.  I then attempted to aligned the fracture appropriately using closed means.  I made a limited incision over Gerdys and carried this down through skin and subcutaneous tissue.  I incised over the IT band tubercle I released this subperiosteally to expose  the proximal tibia.  I used a Cobb elevator to release the muscle along the anterior lateral aspect of the tibia.  I then  used fluoroscopy to choose an appropriate length plate.  I chose a 9 hole plate to use.  I then slid this sub-muscular along the length of the tibia using my jig as a guide.  There was still significant displacement of the proximal segment and I made a small percutaneous incision along the medial aspect of the proximal tibia.  I used a reduction tenaculum to reduce this portion of the proximal shaft to the plate.  Alignment was appropriate and was held provisionally with K wires.  Once the plate was confirmed and reduction was confirmed I then placed 4.0 millimeter screws in the proximal segment of the tibia.  I then used the jig to place percutaneous 4.0 millimeter screws through the plate.  Locking caps were placed at the proximal to shaft screws the last the distal screw was left without a locking cap to prevent a significant stress riser.  I then removed the jig and placed locking caps on the proximal screws.  Another screw in the metaphysis was placed.  The locking cap was placed this as well.  Final fluoroscopic images were obtained.  The wound was irrigated.  The IT band was closed over the plate using #1 Vicryl suture.  The skin was closed with 2-0 Vicryl and 3-0 nylon.  Sterile dressing consisting of bacitracin ointment, Adaptic, 4 x 4's and sterile cast padding was placed.  The patient was then awoken from anesthesia and taken to PACU in stable condition.  Post Op Plan/Instructions: Patient will be touchdown weightbearing to the left lower extremity.  She will receive heparin for DVT prophylaxis.  She will be allowed to mobilize with physical and occupational therapy.  She will receive Ancef for postoperative prophylaxis.  I will place her in a hinged knee brace to provide a little bit more stability to the fracture.  I was present and performed the entire surgery.  Truitt MerleKevin Sarena Jezek, MD [facility]

## 2018-03-14 LAB — GLUCOSE, CAPILLARY
GLUCOSE-CAPILLARY: 132 mg/dL — AB (ref 65–99)
GLUCOSE-CAPILLARY: 121 mg/dL — AB (ref 65–99)
Glucose-Capillary: 145 mg/dL — ABNORMAL HIGH (ref 65–99)

## 2018-03-14 LAB — CBC
HCT: 36.7 % (ref 36.0–46.0)
HEMOGLOBIN: 11.4 g/dL — AB (ref 12.0–15.0)
MCH: 30.8 pg (ref 26.0–34.0)
MCHC: 31.1 g/dL (ref 30.0–36.0)
MCV: 99.2 fL (ref 78.0–100.0)
Platelets: 149 10*3/uL — ABNORMAL LOW (ref 150–400)
RBC: 3.7 MIL/uL — ABNORMAL LOW (ref 3.87–5.11)
RDW: 14.5 % (ref 11.5–15.5)
WBC: 7.6 10*3/uL (ref 4.0–10.5)

## 2018-03-14 LAB — BASIC METABOLIC PANEL
ANION GAP: 11 (ref 5–15)
BUN: 29 mg/dL — ABNORMAL HIGH (ref 6–20)
CALCIUM: 8.4 mg/dL — AB (ref 8.9–10.3)
CHLORIDE: 101 mmol/L (ref 101–111)
CO2: 24 mmol/L (ref 22–32)
CREATININE: 1.89 mg/dL — AB (ref 0.44–1.00)
GFR calc Af Amer: 32 mL/min — ABNORMAL LOW (ref >60)
GFR calc non Af Amer: 27 mL/min — ABNORMAL LOW (ref >60)
Glucose, Bld: 170 mg/dL — ABNORMAL HIGH (ref 65–99)
Potassium: 3.8 mmol/L (ref 3.5–5.1)
SODIUM: 136 mmol/L (ref 135–145)

## 2018-03-14 MED ORDER — HYDRALAZINE HCL 50 MG PO TABS
100.0000 mg | ORAL_TABLET | Freq: Three times a day (TID) | ORAL | Status: DC
  Administered 2018-03-14 – 2018-03-22 (×25): 100 mg via ORAL
  Filled 2018-03-14 (×25): qty 2

## 2018-03-14 MED ORDER — FUROSEMIDE 40 MG PO TABS
80.0000 mg | ORAL_TABLET | Freq: Every day | ORAL | Status: DC
  Administered 2018-03-14 – 2018-03-16 (×3): 80 mg via ORAL
  Filled 2018-03-14 (×3): qty 2

## 2018-03-14 MED ORDER — ACETAMINOPHEN 325 MG PO TABS
650.0000 mg | ORAL_TABLET | Freq: Four times a day (QID) | ORAL | Status: DC | PRN
  Administered 2018-03-14 – 2018-03-16 (×3): 650 mg via ORAL
  Filled 2018-03-14 (×3): qty 2

## 2018-03-14 NOTE — Anesthesia Postprocedure Evaluation (Signed)
Anesthesia Post Note  Patient: Hannah Valentine  Procedure(s) Performed: OPEN REDUCTION INTERNAL FIXATION (ORIF) TIBIA/FIBULA FRACTURE (Left )     Patient location during evaluation: PACU Anesthesia Type: General Level of consciousness: awake and alert Pain management: pain level controlled Vital Signs Assessment: post-procedure vital signs reviewed and stable Respiratory status: spontaneous breathing, nonlabored ventilation, respiratory function stable and patient connected to nasal cannula oxygen Cardiovascular status: blood pressure returned to baseline and stable Postop Assessment: no apparent nausea or vomiting Anesthetic complications: no    Last Vitals:  Vitals:   03/13/18 2017 03/14/18 0539  BP: (!) 166/54 (!) 163/53  Pulse: 68 72  Resp: 17 18  Temp: 37.1 C 37.4 C  SpO2: 97% 94%    Last Pain:  Vitals:   03/14/18 0539  TempSrc: Oral  PainSc:    Pain Goal: Patients Stated Pain Goal: 3 (03/12/18 1738)               Trevor IhaStephen A Houser

## 2018-03-14 NOTE — Progress Notes (Signed)
Pt requested to have Attending Physician changed and was upset with current treatment plan due to kidney transplant and wanting all home medications re-started at her original home times. MD made aware.

## 2018-03-14 NOTE — Progress Notes (Signed)
PROGRESS NOTE    Hannah Valentine  HQI:[id number] DOB: 07/12/1956 DOA: 03/11/2018 PCP: Shellia CleverlyBeane, Lori M, PA   Brief Narrative: Hannah GellDonna Valentine is a very pleasant 62 y.o. female with medical history significant for HTN, CKD stage III, the patient has a history of kidney transplant in 2017, hyperlipidemia, diabetes on insulin pump, who was brought by the EMS, says she suffered a mechanical fall.  In review, the patient tripped over her dog, sustaining an injury on the left leg, when she fell she had broken.  She could not get up.  She localized the pain at the area of the fracture.  She denies any other areas of pain.  She did not hit her head.  She denies any nausea, vomiting, chest pain or palpitations.  She denies any shortness of breath or cough.  Currently, the pain is about 5 out of 10. X-rays demonstrated left tibial and fibular fracture.  Orthopedic consultation was obtained, and recommended a CT of the knee to rule out articular extension, which confirmed a comminuted tibial metadiaphyseal fracture, with impaction and up to 15 mm of lateral displacement, without significant angulation.  There is no proximal extension of this fracture.  No intra-articular abnormality seen at the tibiofemoral joint.  Subjective  Reports pain is controlled, had hypoglycemia 58 yesterday, but reports she was a symptomatic, denies any chest pain or shortness of breath  Assessment & Plan:   Principal Problem:   Left tibial fracture Active Problems:   Fall   History of kidney transplant   Essential hypertension   HLD (hyperlipidemia)   CKD (chronic kidney disease), stage III (HCC)   DM (diabetes mellitus), type 1 (HCC)   Closed fracture of upper end of left fibula   Left tibial fracture;  -Secondary to mechanical fall, S/P ORIF of left tibial shaft fracture.  - Management per ORTHO.  -TIR consulted per PT recommendation  DM type 1; -She is on insulin pump, she is quite comfortable with using the pump, DM  coordinator helping with the pump, she had couple doses of hypoglycemia overnight, but reports she was not symptomatic, she tells me her last A1c was 6.4 DM    CKD stage III - status post renal transplant 2017, on oral immunosuppressant therapy. Baseline getting around 1.7, 18 this morning is 1.8, she has been that high in the past, renal service consulted regarding recommendation about antihypertensive medication and diuresis. - Continue prednisone, Bactrim, Prograf Avoid hypotension, nephrotoxin.   HTN;  -She is back on her Norvasc, Imdur Cardura and metoprolol, hydralazine remains on hold, will await renal recommendation   DVT prophylaxis: Subcu heparin Code Status; full code.  Family Communication: None at bedside Disposition Plan: CAR consult requested   Consultants:   ortho    Procedures:   S/P ORIF of left tibial shaft fracture   Antimicrobials:  Ancef prophylaxis.     Objective: Vitals:   03/13/18 1433 03/13/18 2017 03/14/18 0539 03/14/18 1200  BP: (!) 134/49 (!) 166/54 (!) 163/53 (!) 146/45  Pulse: (!) 51 68 72 63  Resp: 17 17 18 18   Temp: 98.2 F (36.8 C) 98.7 F (37.1 C) 99.4 F (37.4 C) 98.9 F (37.2 C)  TempSrc: Oral Oral Oral Oral  SpO2: 95% 97% 94%   Weight:      Height:        Intake/Output Summary (Last 24 hours) at 03/14/2018 1402 Last data filed at 03/14/2018 0900 Gross per 24 hour  Intake 720 ml  Output 1100 ml  Net -380 ml   Filed Weights   03/11/18 1000 03/13/18 1237  Weight: 90.7 kg (200 lb) 99.5 kg (219 lb 5.7 oz)    Examination:  Awake Alert, Oriented X 3, No new F.N deficits, Normal affect Symmetrical Chest wall movement, Good air movement bilaterally, CTAB RRR,No Gallops,Rubs or new Murmurs, No Parasternal Heave +ve B.Sounds, Abd Soft, No tenderness,No rebound - guarding or rigidity. No Cyanosis, Clubbing , No new Rash or bruise, left lower extremity with some swelling wrapped, with immobilizer   General exam:  NAD Respiratory system: CTA Cardiovascular system: S 1, S 2 RRR Gastrointestinal system: BS present, soft, nt Central nervous system: non focal.  Extremities: Left LE with dressing.  Psychiatry: mood and affected appropriate     Data Reviewed: I have personally reviewed following labs and imaging studies  CBC: Recent Labs  Lab 03/11/18 1103 03/13/18 0331 03/14/18 0403  WBC 7.2 8.7 7.6  NEUTROABS 5.5  --   --   HGB 14.0 11.6* 11.4*  HCT 43.9 37.3 36.7  MCV 96.7 99.2 99.2  PLT 182 152 149*   Basic Metabolic Panel: Recent Labs  Lab 03/11/18 1103 03/12/18 1004 03/13/18 0833 03/14/18 0403  NA 142 139 135 136  K 3.7 4.0 3.7 3.8  CL 109 109 102 101  CO2 24 22 22 24   GLUCOSE 89 141* 144* 170*  BUN 36* 35* 28* 29*  CREATININE 1.84* 1.99* 1.62* 1.89*  CALCIUM 8.5* 8.2* 8.3* 8.4*   GFR: Estimated Creatinine Clearance: 37.4 mL/min (A) (by C-G formula based on SCr of 1.89 mg/dL (H)). Liver Function Tests: No results for input(s): AST, ALT, ALKPHOS, BILITOT, PROT, ALBUMIN in the last 168 hours. No results for input(s): LIPASE, AMYLASE in the last 168 hours. No results for input(s): AMMONIA in the last 168 hours. Coagulation Profile: Recent Labs  Lab 03/11/18 1103  INR 1.08   Cardiac Enzymes: Recent Labs  Lab 03/11/18 1550  TROPONINI <0.03   BNP (last 3 results) No results for input(s): PROBNP in the last 8760 hours. HbA1C: Recent Labs    03/11/18 1818  HGBA1C 5.8*   CBG: Recent Labs  Lab 03/13/18 1122 03/13/18 1622 03/13/18 2119 03/13/18 2254 03/14/18 1209  GLUCAP 128* 76 58* 66 132*   Lipid Profile: No results for input(s): CHOL, HDL, LDLCALC, TRIG, CHOLHDL, LDLDIRECT in the last 72 hours. Thyroid Function Tests: No results for input(s): TSH, T4TOTAL, FREET4, T3FREE, THYROIDAB in the last 72 hours. Anemia Panel: No results for input(s): VITAMINB12, FOLATE, FERRITIN, TIBC, IRON, RETICCTPCT in the last 72 hours. Sepsis Labs: No results for  input(s): PROCALCITON, LATICACIDVEN in the last 168 hours.  Recent Results (from the past 240 hour(s))  MRSA PCR Screening     Status: None   Collection Time: 03/11/18  6:21 PM  Result Value Ref Range Status   MRSA by PCR NEGATIVE NEGATIVE Final    Comment:        The GeneXpert MRSA Assay (FDA approved for NASAL specimens only), is one component of a comprehensive MRSA colonization surveillance program. It is not intended to diagnose MRSA infection nor to guide or monitor treatment for MRSA infections. Performed at [facility], [address] [place], [place] [address]          Radiology Studies: Dg Tibia/fibula Left Port  Result Date: 03/12/2018 CLINICAL DATA:  Patient status post fixation a left tibial fracture which the patient suffered in a fall 03/11/2018. Initial encounter. EXAM: PORTABLE LEFT TIBIA AND FIBULA -  2 VIEW COMPARISON:  CT left knee and plain films left knee 03/11/2018. FINDINGS: Lateral plate and screws are in place for fixation of a proximal tibial fracture. Position and alignment are markedly improved. Proximal fibular fracture noted. No acute finding. IMPRESSION: Status post ORIF of a left tibial fracture with marked improvement in position and alignment. No new abnormality. Electronically Signed   By: Drusilla Kanner M.D.   On: 03/12/2018 14:11        Scheduled Meds: . amLODipine  5 mg Oral Daily  . doxazosin  4 mg Oral QPM  . ferrous sulfate  325 mg Oral Daily  . heparin injection (subcutaneous)  5,000 Units Subcutaneous Q8H  . insulin pump   Subcutaneous TID AC & HS  . isosorbide mononitrate  60 mg Oral BID  . metoprolol tartrate  50 mg Oral BID  . mycophenolate  360 mg Oral Daily   And  . mycophenolate  180 mg Oral QPM  . pravastatin  40 mg Oral QHS  . predniSONE  5 mg Oral QAC breakfast  . sulfamethoxazole-trimethoprim  1 tablet Oral Q M,W,F  . tacrolimus  2 mg Oral BID   Continuous Infusions:    LOS: 3 days     Huey Bienenstock, MD [facility] Pager [phone]  If 7PM-7AM, please contact night-coverage [email or web address] Password [email or web address] 03/14/2018, 2:02 PM

## 2018-03-14 NOTE — Progress Notes (Signed)
Orthopaedic Trauma Progress Note  S: Doing better, no significant complaints this AM  O:  Vitals:   03/13/18 2017 03/14/18 0539  BP: (!) 166/54 (!) 163/53  Pulse: 68 72  Resp: 17 18  Temp: 98.7 F (37.1 C) 99.4 F (37.4 C)  SpO2: 97% 94%  Gen: NAD AAOx3  LLE: Incisions clean, dry and intact. Tight achilles, Motor and sensory function intact to foot.  Imaging: Stable postop imaging  Labs:  CBC    Component Value Date/Time   WBC 7.6 03/14/2018 0403   RBC 3.70 (L) 03/14/2018 0403   HGB 11.4 (L) 03/14/2018 0403   HCT 36.7 03/14/2018 0403   PLT 149 (L) 03/14/2018 0403   MCV 99.2 03/14/2018 0403   MCH 30.8 03/14/2018 0403   MCHC 31.1 03/14/2018 0403   RDW 14.5 03/14/2018 0403   LYMPHSABS 1.0 03/11/2018 1103   MONOABS 0.5 03/11/2018 1103   EOSABS 0.2 03/11/2018 1103   BASOSABS 0.1 03/11/2018 110543    A/P: 62 year old female with a history of kidney transplant presents status post ORIF of left proximal tibial shaft fracture  Patient will need aggressive heel cord stretching.  Weightbearing: TDWB LLE Insicional and dressing care: Dry dressing PRN Orthopedic device(s): Hinged knee brace unlocked Showering: Not yet VTE prophylaxis: Recommend heparin subcutaneous due to kidney issues. Pain control: Oxycodone and hydrocodone Follow - up plan: 2 weeks  Roby LoftsKevin P. Haddix, MD [facility] [phone] (phone)

## 2018-03-14 NOTE — Consult Note (Signed)
Renal Service Consult Note [facility]  Hannah GellDonna Valentine 03/14/2018 Hannah Valentine Requesting Physician: Dr Randol KernElgergawy  Reason for Consult:  Renal transplant pt HPI: The patient is a 62 y.o. year-old with hx of hypertension dm2 and esrd sp renal [facility] presented after a fall and left tibia fracture. Underwent ORIF on 6.7.  Asked to see for BP and transplant management.    Patient is back on all her home Tx and HTN meds except for lasix 80 qd and hydralazine 100 tid.   Patient had CKD since 1997 [place] moving to [place].  Went on HD in 2015 for 2 yrs then [facility].  Had transplant f/u for 2 yrs and was then returned [place] care.  She states baseline creat is 1.6- 1.7.    Denies any current SOB, swelling, CP, n/v/d, abd pain, dysuria or voiding issue   ROS  denies CP  no joint pain   no HA  no blurry vision  no rash  no diarrhea  no nausea/ vomiting  no dysuria  no difficulty voiding  no change in urine color    Past Medical History  Past Medical History:  Diagnosis Date  . CKD (chronic kidney disease), stage III (HCC)   . Essential hypertension   . HLD (hyperlipidemia)   . Kidney transplanted 2017  . Type II diabetes mellitus with renal manifestations [facility](HCC)    Past Surgical History  Past Surgical History:  Procedure Laterality Date  . NEPHRECTOMY TRANSPLANTED ORGAN     Family History  Family History  Problem Relation Age of Onset  . CAD Mother   . Lung cancer Mother   . Stroke Father   . Heart attack Father   . Diabetes Mellitus I Father   . Hypertension Father   . Breast cancer Sister   . Hypertension Sister   . Heart attack Brother    Social History  reports that she has never smoked. She has never used smokeless tobacco. She reports that she does not drink alcohol or use drugs. Allergies  Allergies  Allergen Reactions  . Atorvastatin  Other (See Comments)    Muscle weakness  . Hydrocodone-Acetaminophen Nausea Only  . Labetalol Other (See Comments)    "makes her feel bad"  . Meperidine Nausea Only  . Pregabalin Other (See Comments)    dizzy   Home medications Prior to Admission medications   Medication Sig Start Date End Date Taking? Authorizing Provider  amLODipine (NORVASC) 5 MG tablet Take 5 mg by mouth daily. 02/26/18  Yes [provider]  aspirin EC 81 MG tablet Take 81 mg by mouth at bedtime.    Yes [provider]  Cholecalciferol (VITAMIN D) 2000 units tablet Take 5,000 Units by mouth daily.  09/24/17  Yes [provider]  doxazosin (CARDURA) 4 MG tablet Take 4 mg by mouth every evening. 07/24/17  Yes [provider]  ferrous sulfate 325 (65 FE) MG EC tablet Take 325 mg by mouth daily.   Yes [provider]  furosemide (LASIX) 80 MG tablet Take 80 mg by mouth daily. 07/08/17  Yes [provider]  hydrALAZINE (APRESOLINE) 100 MG tablet Take 100 mg by mouth 3 (three) times daily. 07/24/17  Yes [provider]  Insulin Human (INSULIN PUMP) SOLN Inject into the skin continuous. Novolog   Yes [provider]  isosorbide  mononitrate (IMDUR) 60 MG 24 hr tablet Take 60 mg by mouth 2 (two) times daily. 04/09/17  Yes [provider]  metoprolol tartrate (LOPRESSOR) 25 MG tablet Take 50 mg by mouth 2 (two) times daily. 06/22/17  Yes [provider]  mycophenolate (MYFORTIC) 180 MG EC tablet Take 180-360 mg by mouth See admin instructions. Take 360 mg every morning and take 180 mg every evening 06/04/17  Yes [provider]  pravastatin (PRAVACHOL) 40 MG tablet Take 40 mg by mouth at bedtime.  09/04/17  Yes [provider]  predniSONE (DELTASONE) 5 MG tablet Take 5 mg by mouth daily. 07/24/17  Yes [provider]  sulfamethoxazole-trimethoprim (BACTRIM,SEPTRA) 400-80 MG tablet Take 1 tablet by mouth every Monday,  Wednesday, and Friday. 09/05/17  Yes [provider]  tacrolimus (PROGRAF) 1 MG capsule Take 2 mg by mouth 2 (two) times daily.  07/06/17  Yes [provider]   Liver Function Tests No results for input(s): AST, ALT, ALKPHOS, BILITOT, PROT, ALBUMIN in the last 168 hours. No results for input(s): LIPASE, AMYLASE in the last 168 hours. CBC Recent Labs  Lab 03/11/18 1103 03/13/18 0331 03/14/18 0403  WBC 7.2 8.7 7.6  NEUTROABS 5.5  --   --   HGB 14.0 11.6* 11.4*  HCT 43.9 37.3 36.7  MCV 96.7 99.2 99.2  PLT 182 152 149*   Basic Metabolic Panel Recent Labs  Lab 03/11/18 1103 03/12/18 1004 03/13/18 0833 03/14/18 0403  NA 142 139 135 136  K 3.7 4.0 3.7 3.8  CL 109 109 102 101  CO2 24 22 22 24   GLUCOSE 89 141* 144* 170*  BUN 36* 35* 28* 29*  CREATININE 1.84* 1.99* 1.62* 1.89*  CALCIUM 8.5* 8.2* 8.3* 8.4*   Iron/TIBC/Ferritin/ %Sat No results found for: IRON, TIBC, FERRITIN, IRONPCTSAT  Vitals:   03/13/18 1433 03/13/18 2017 03/14/18 0539 03/14/18 1200  BP: (!) 134/49 (!) 166/54 (!) 163/53 (!) 146/45  Pulse: (!) 51 68 72 63  Resp: 17 17 18 18   Temp: 98.2 F (36.8 C) 98.7 F (37.1 C) 99.4 F (37.4 C) 98.9 F (37.2 C)  TempSrc: Oral Oral Oral Oral  SpO2: 95% 97% 94%   Weight:      Height:       Exam Gen alert, no distress No rash, cyanosis or gangrene Sclera anicteric, throat clear  No jvd or bruits Chest clear bilat to bases RRR no MRG Abd soft ntnd no mass or ascites +bs  GU defer MS no joint effusions or deformity, L leg in large brace and wrapped underneath Ext no LE or UE edema, no wounds or ulcers Neuro is alert, Ox 3 , nf LUA AVF +bruit.     Home meds:  - norvasc 5/ cardura 4 hs/ lasix 80 qd/ hydralazine 100 tid/ metoprolol 50 bid  - myfortic 360 am + 180 pm/ prograf 2 mg bid/ pred 5 mg qd  - statin/ bactrim MWF/ imdur 60/ ecasa/ feso4  - insulin pump      Impression: 1  Renal transplant - ddkt done in 2017, on appropriate meds  and creat is at baseline in 1.6- 1.9 range here. Did HD for 2 years prior to renal Tx. Has AVF L arm. 2  Hypertension - bp's are stable normal to slightly high, ok to resume her lasix and hydralazine at home doses.  Orders written.  She is on 4 bp meds and lasix at home.  3  Tibial shaft fracture - after a  fall, sp ORIF on 6/7.   4  DM2 - per primary   Plan - will follow  Vinson Moselle MD [facility] pager [phone]   03/14/2018, 2:08 PM

## 2018-03-15 ENCOUNTER — Encounter (HOSPITAL_COMMUNITY): Payer: Self-pay | Admitting: General Practice

## 2018-03-15 ENCOUNTER — Other Ambulatory Visit: Payer: Self-pay

## 2018-03-15 ENCOUNTER — Inpatient Hospital Stay (HOSPITAL_COMMUNITY): Payer: Medicare Other

## 2018-03-15 DIAGNOSIS — R509 Fever, unspecified: Secondary | ICD-10-CM

## 2018-03-15 DIAGNOSIS — D62 Acute posthemorrhagic anemia: Secondary | ICD-10-CM

## 2018-03-15 DIAGNOSIS — I10 Essential (primary) hypertension: Secondary | ICD-10-CM

## 2018-03-15 DIAGNOSIS — T1490XA Injury, unspecified, initial encounter: Secondary | ICD-10-CM

## 2018-03-15 DIAGNOSIS — S82832A Other fracture of upper and lower end of left fibula, initial encounter for closed fracture: Secondary | ICD-10-CM

## 2018-03-15 DIAGNOSIS — I169 Hypertensive crisis, unspecified: Secondary | ICD-10-CM

## 2018-03-15 DIAGNOSIS — R7303 Prediabetes: Secondary | ICD-10-CM

## 2018-03-15 DIAGNOSIS — Z940 Kidney transplant status: Secondary | ICD-10-CM

## 2018-03-15 DIAGNOSIS — N183 Chronic kidney disease, stage 3 (moderate): Secondary | ICD-10-CM

## 2018-03-15 DIAGNOSIS — S82102A Unspecified fracture of upper end of left tibia, initial encounter for closed fracture: Secondary | ICD-10-CM

## 2018-03-15 LAB — URINALYSIS, ROUTINE W REFLEX MICROSCOPIC
BACTERIA UA: NONE SEEN
Bilirubin Urine: NEGATIVE
GLUCOSE, UA: NEGATIVE mg/dL
Ketones, ur: NEGATIVE mg/dL
Leukocytes, UA: NEGATIVE
NITRITE: NEGATIVE
PH: 6 (ref 5.0–8.0)
Protein, ur: NEGATIVE mg/dL
Specific Gravity, Urine: 1.008 (ref 1.005–1.030)

## 2018-03-15 LAB — GLUCOSE, CAPILLARY
Glucose-Capillary: 177 mg/dL — ABNORMAL HIGH (ref 65–99)
Glucose-Capillary: 77 mg/dL (ref 65–99)
Glucose-Capillary: 157 mg/dL — ABNORMAL HIGH (ref 65–99)
Glucose-Capillary: 82 mg/dL (ref 65–99)

## 2018-03-15 LAB — CBC
HEMATOCRIT: 33.3 % — AB (ref 36.0–46.0)
HEMOGLOBIN: 10.6 g/dL — AB (ref 12.0–15.0)
MCH: 30.6 pg (ref 26.0–34.0)
MCHC: 31.8 g/dL (ref 30.0–36.0)
MCV: 96.2 fL (ref 78.0–100.0)
Platelets: 159 10*3/uL (ref 150–400)
RBC: 3.46 MIL/uL — AB (ref 3.87–5.11)
RDW: 14.2 % (ref 11.5–15.5)
WBC: 7.2 10*3/uL (ref 4.0–10.5)

## 2018-03-15 LAB — BASIC METABOLIC PANEL
ANION GAP: 10 (ref 5–15)
BUN: 33 mg/dL — ABNORMAL HIGH (ref 6–20)
CHLORIDE: 103 mmol/L (ref 101–111)
CO2: 25 mmol/L (ref 22–32)
Calcium: 8.2 mg/dL — ABNORMAL LOW (ref 8.9–10.3)
Creatinine, Ser: 1.81 mg/dL — ABNORMAL HIGH (ref 0.44–1.00)
GFR calc Af Amer: 33 mL/min — ABNORMAL LOW (ref >60)
GFR, EST NON AFRICAN AMERICAN: 29 mL/min — AB (ref >60)
GLUCOSE: 181 mg/dL — AB (ref 65–99)
POTASSIUM: 3.6 mmol/L (ref 3.5–5.1)
Sodium: 138 mmol/L (ref 135–145)

## 2018-03-15 MED ORDER — POLYETHYLENE GLYCOL 3350 17 G PO PACK
34.0000 g | PACK | Freq: Once | ORAL | Status: DC
  Filled 2018-03-15: qty 2

## 2018-03-15 NOTE — Progress Notes (Signed)
[facility] Progress Note  Subjective : feels feverish, tmax 100.4 yest no dysuria  Vitals:   03/14/18 1200 03/14/18 1445 03/14/18 2200 03/15/18 0400  BP: (!) 146/45 (!) 136/46 (!) 101/59 (!) 163/61  Pulse: 63 66 66 88  Resp: 18 18 18  (!) 22  Temp: 98.9 F (37.2 C) 98 F (36.7 C) (!) 100.4 F (38 C) (!) 97.4 F (36.3 C)  TempSrc: Oral Oral Oral Oral  SpO2:  96%    Weight:      Height:        Inpatient medications: . amLODipine  5 mg Oral Daily  . doxazosin  4 mg Oral QPM  . ferrous sulfate  325 mg Oral Daily  . furosemide  80 mg Oral Daily  . heparin injection (subcutaneous)  5,000 Units Subcutaneous Q8H  . hydrALAZINE  100 mg Oral Q8H  . insulin pump   Subcutaneous TID AC & HS  . isosorbide mononitrate  60 mg Oral BID  . metoprolol tartrate  50 mg Oral BID  . mycophenolate  360 mg Oral Daily   And  . mycophenolate  180 mg Oral QPM  . polyethylene glycol  34 g Oral Once  . pravastatin  40 mg Oral QHS  . predniSONE  5 mg Oral QAC breakfast  . sulfamethoxazole-trimethoprim  1 tablet Oral Q M,W,F  . tacrolimus  2 mg Oral BID    acetaminophen, hydrALAZINE, HYDROcodone-acetaminophen, ondansetron (ZOFRAN) IV, oxyCODONE, polyethylene glycol  Exam: Gen alert, no distress No rash, cyanosis or gangrene Sclera anicteric, throat clear  No jvd or bruits Chest clear bilat to bases RRR no MRG Abd soft ntnd no mass or ascites +bs  GU defer MS no joint effusions or deformity, L leg in large brace and wrapped underneath Ext no LE or UE edema, no wounds or ulcers Neuro is alert, Ox 3 , nf LUA AVF +bruit.     Home meds:  - norvasc 5/ cardura 4 hs/ lasix 80 qd/ hydralazine 100 tid/ metoprolol 50 bid  - myfortic 360 am + 180 pm/ prograf 2 mg bid/ pred 5 mg qd  - statin/ bactrim MWF/ imdur 60/ ecasa/ feso4  - insulin pump    Impression: 1  Renal transplant '17 -  on appropriate meds and creat is at baseline in 1.6- 1.9 range here. Stable creatinine.  2   Hypertension - back on all 4 home BP meds + lasix 80 qd and BP's ok.  3 Tibial shaft fracture - after a fall, sp ORIF on 6/7.   4  DM2 - per primary 5  Fever low grade - no cough or dysuria, will get UA and cXR w/ transplant hx   Plan - as above   Vinson Moselle MD [facility] pager [phone]   03/15/2018, 3:11 PM   Recent Labs  Lab 03/13/18 0833 03/14/18 0403 03/15/18 0343  NA 135 136 138  K 3.7 3.8 3.6  CL 102 101 103  CO2 22 24 25   GLUCOSE 144* 170* 181*  BUN 28* 29* 33*  CREATININE 1.62* 1.89* 1.81*  CALCIUM 8.3* 8.4* 8.2*   No results for input(s): AST, ALT, ALKPHOS, BILITOT, PROT, ALBUMIN in the last 168 hours. Recent Labs  Lab 03/11/18 1103 03/13/18 0331 03/14/18 0403 03/15/18 0343  WBC 7.2 8.7 7.6 7.2  NEUTROABS 5.5  --   --   --   HGB 14.0 11.6* 11.4* 10.6*  HCT 43.9 37.3 36.7 33.3*  MCV 96.7 99.2 99.2 96.2  PLT 182 152 149* 159   Iron/TIBC/Ferritin/ %Sat No results found for: IRON, TIBC, FERRITIN, IRONPCTSAT

## 2018-03-15 NOTE — Care Management Important Message (Signed)
Important Message  Patient Details  Name: Hannah Valentine MRN: [id number] Date of Birth: 10/24/1955   Medicare Important Message Given:  Yes    Hannah Valentine 03/15/2018, 4:05 PM

## 2018-03-15 NOTE — Consult Note (Signed)
Physical Medicine and Rehabilitation Consult  Reason for Consult: Functional deficits due to Tib-Fib fracture Referring Physician: Dr. Sunnie Nielsen.    HPI: Hannah Valentine is a 62 y.o. female with history of HTN, renal transplant with baseline SCr 1.6-1.7 who was admitted on 03/11/18 with fall --tripped over her dog with subsequent left tib fib fracture. History taken from chart review and patient. She was evaluated by Dr. [name] and underwent ORIF left proximal tibia on 06/07. To be TDWB with hinged knee brace for support. Therapy evaluations done this weekend revealing deficits in mobility as well as ability to carry out ADLs. CIR recommended due to functional decline.    Review of Systems  Constitutional: Negative for fever.  HENT: Negative for hearing loss and tinnitus.   Eyes: Negative for blurred vision.  Respiratory: Negative for cough and sputum production.   Cardiovascular: Positive for leg swelling. Negative for chest pain and palpitations.  Gastrointestinal: Positive for constipation. Negative for heartburn and nausea.  Genitourinary: Negative for dysuria and urgency.  Musculoskeletal: Positive for joint pain. Negative for back pain and myalgias.  Skin: Negative for rash.  Neurological: Negative for dizziness and headaches.  Psychiatric/Behavioral: Negative for depression. The patient is not nervous/anxious.   All other systems reviewed and are negative.    Past Medical History:  Diagnosis Date  . CKD (chronic kidney disease), stage III (HCC)   . Essential hypertension   . HLD (hyperlipidemia)   . Kidney transplanted 2017  . Type II diabetes mellitus with renal manifestations [facility])     Past Surgical History:  Procedure Laterality Date  . NEPHRECTOMY TRANSPLANTED ORGAN      Family History  Problem Relation Age of Onset  . CAD Mother   . Lung cancer Mother   . Stroke Father   . Heart attack Father   . Diabetes Mellitus I Father   . Hypertension Father   .  Breast cancer Sister   . Hypertension Sister   . Heart attack Brother     Social History:  Married. Husband retired and can provide supervision after discharge. 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  Independent PTA. She reports that she has never smoked. She has never used smokeless tobacco. She reports that she does not drink alcohol or use drugs.    Allergies  Allergen Reactions  . Atorvastatin Other (See Comments)    Muscle weakness  . Hydrocodone-Acetaminophen Nausea Only  . Labetalol Other (See Comments)    "makes her feel bad"  . Meperidine Nausea Only  . Pregabalin Other (See Comments)    dizzy    Medications Prior to Admission  Medication Sig Dispense Refill  . amLODipine (NORVASC) 5 MG tablet Take 5 mg by mouth daily.  2  . aspirin EC 81 MG tablet Take 81 mg by mouth at bedtime.     . Cholecalciferol (VITAMIN D) 2000 units tablet Take 5,000 Units by mouth daily.     Marland Kitchen doxazosin (CARDURA) 4 MG tablet Take 4 mg by mouth every evening.  3  . ferrous sulfate 325 (65 FE) MG EC tablet Take 325 mg by mouth daily.    . furosemide (LASIX) 80 MG tablet Take 80 mg by mouth daily.  3  . hydrALAZINE (APRESOLINE) 100 MG tablet Take 100 mg by mouth 3 (three) times daily.  3  . Insulin Human (INSULIN PUMP) SOLN Inject into the skin continuous. Novolog    . isosorbide mononitrate (IMDUR) 60 MG 24 hr tablet Take 60  mg by mouth 2 (two) times daily.    . metoprolol tartrate (LOPRESSOR) 25 MG tablet Take 50 mg by mouth 2 (two) times daily.    . mycophenolate (MYFORTIC) 180 MG EC tablet Take 180-360 mg by mouth See admin instructions. Take 360 mg every morning and take 180 mg every evening    . pravastatin (PRAVACHOL) 40 MG tablet Take 40 mg by mouth at bedtime.   3  . predniSONE (DELTASONE) 5 MG tablet Take 5 mg by mouth daily.    Marland Kitchen sulfamethoxazole-trimethoprim (BACTRIM,SEPTRA) 400-80 MG tablet Take 1 tablet by mouth every Monday, Wednesday, and Friday.  2  . tacrolimus (PROGRAF) 1 MG capsule Take  2 mg by mouth 2 (two) times daily.       Home: Home Living Family/patient expects to be discharged to:: Private residence Living Arrangements: Spouse/significant other Available Help at Discharge: Family, Available 24 hours/day Type of Home: House Home Access: Level entry(into garage ) Home Layout: Multi-level(garage level has full bathroom but no bed) Alternate Level Stairs-Number of Steps: 12 Alternate Level Stairs-Rails: Right Bathroom Shower/Tub: Heritage manager Accessibility: (not sure; entrance to bathroom in garage may be tricky) Home Equipment: None  Functional History: Prior Function Level of Independence: Independent Comments: retired Functional Status:  Mobility: Bed Mobility Overal bed mobility: Needs Assistance Bed Mobility: Supine to Sit, Sit to Supine Supine to sit: Min assist Sit to supine: Mod assist, +2 for safety/equipment General bed mobility comments: Min A to manage LLE  Transfers Overall transfer level: Needs assistance Equipment used: None Transfers: Lateral/Scoot Transfers  Lateral/Scoot Transfers: Mod assist General transfer comment: Mod A to manage LLE and block R knee      ADL: ADL Overall ADL's : Needs assistance/impaired Eating/Feeding: Set up, Sitting Grooming: Set up, Sitting Upper Body Bathing: Sitting, Minimal assistance Lower Body Bathing: Maximal assistance, Sitting/lateral leans, Bed level Upper Body Dressing : Sitting, Set up, Supervision/safety Lower Body Dressing: Maximal assistance, Sitting/lateral leans, Bed level Toilet Transfer: Moderate assistance, Requires drop arm(Lateral scoot to drop arm recliner) Toilet Transfer Details (indicate cue type and reason): Pt requiring Mod A to manage LLE to reduce pain. Pt demonstrating high motivation to laterally scoot to a drop arm recliner. Educating pt on use of drop arm commode for toileting  Functional mobility during ADLs: Moderate assistance(lateral scoots) General ADL  Comments: Pt demonstrating high motivation to participate in therapy despite fatigue and pain. Pt performing lateral scoot to drop arm recliner with Mod A for LLE management. Requiring Min VCs for hand placement. Pt's husband very supportive and providing encouragement and cues for scooting.  Cognition: Cognition Overall Cognitive Status: Within Functional Limits for tasks assessed Orientation Level: Oriented X4 Cognition Arousal/Alertness: Awake/alert Behavior During Therapy: WFL for tasks assessed/performed Overall Cognitive Status: Within Functional Limits for tasks assessed General Comments: highly motivated to participate in therapy   Blood pressure (!) 163/61, pulse 88, temperature (!) 97.4 F (36.3 C), temperature source Oral, resp. rate (!) 22, height 5\' 7"  (1.702 m), weight 99.5 kg (219 lb 5.7 oz), SpO2 96 %. Physical Exam  Nursing note and vitals reviewed. Constitutional: She is oriented to person, place, and time. She appears well-developed.  obese  HENT:  Head: Normocephalic.  Resolving ecchymosis right cheek.   Eyes: EOM are normal. Right eye exhibits no discharge. Left eye exhibits no discharge.  Neck: Normal range of motion. Neck supple.  Cardiovascular: Normal rate and regular rhythm.  Respiratory: Effort normal and breath sounds normal.  GI: Soft. Bowel sounds are  normal.  Musculoskeletal:  Left lower extremity edema and tenderness  Neurological: She is alert and oriented to person, place, and time.  Motor: Bilateral upper extremities, right lower extremity: 5/5 proximal distal Left lower extremity: Hip flexion, knee extension, ankle dorsiflexion 1/5, wiggles toes Sensation intact light touch  Skin:  Multiple ecchymotic areas BUE.  Compressive dressing and KI in place on LLE    Results for orders placed or performed during the hospital encounter of 03/11/18 (from the past 24 hour(s))  Glucose, capillary     Status: Abnormal   Collection Time: 03/14/18 12:09 PM   Result Value Ref Range   Glucose-Capillary 132 (H) 65 - 99 mg/dL  Glucose, capillary     Status: Abnormal   Collection Time: 03/14/18  4:15 PM  Result Value Ref Range   Glucose-Capillary 121 (H) 65 - 99 mg/dL  Glucose, capillary     Status: Abnormal   Collection Time: 03/14/18 11:27 PM  Result Value Ref Range   Glucose-Capillary 145 (H) 65 - 99 mg/dL  CBC     Status: Abnormal   Collection Time: 03/15/18  3:43 AM  Result Value Ref Range   WBC 7.2 4.0 - 10.5 K/uL   RBC 3.46 (L) 3.87 - 5.11 MIL/uL   Hemoglobin 10.6 (L) 12.0 - 15.0 g/dL   HCT 91.4 (L) 78.2 - 95.6 %   MCV 96.2 78.0 - 100.0 fL   MCH 30.6 26.0 - 34.0 pg   MCHC 31.8 30.0 - 36.0 g/dL   RDW 21.3 08.6 - 57.8 %   Platelets 159 150 - 400 K/uL  Basic metabolic panel     Status: Abnormal   Collection Time: 03/15/18  3:43 AM  Result Value Ref Range   Sodium 138 135 - 145 mmol/L   Potassium 3.6 3.5 - 5.1 mmol/L   Chloride 103 101 - 111 mmol/L   CO2 25 22 - 32 mmol/L   Glucose, Bld 181 (H) 65 - 99 mg/dL   BUN 33 (H) 6 - 20 mg/dL   Creatinine, Ser 4.69 (H) 0.44 - 1.00 mg/dL   Calcium 8.2 (L) 8.9 - 10.3 mg/dL   GFR calc non Af Amer 29 (L) >60 mL/min   GFR calc Af Amer 33 (L) >60 mL/min   Anion gap 10 5 - 15  Glucose, capillary     Status: Abnormal   Collection Time: 03/15/18  5:01 AM  Result Value Ref Range   Glucose-Capillary 177 (H) 65 - 99 mg/dL   No results found.  Assessment/Plan: Diagnosis: Left tib-fib fracture Labs independently reviewed.  Records reviewed and summated above.  1. Does the need for close, 24 hr/day medical supervision in concert with the patient's rehab needs make it unreasonable for this patient to be served in a less intensive setting? Yes  2. Co-Morbidities requiring supervision/potential complications: fevers (cont to monitor for signs and symptoms of infection, further workup if indicated), hypertensive crisis with essential hypertension (monitor and provide prns in accordance with increased  physical exertion and pain), prediabetes (Monitor in accordance with exercise and adjust meds as necessary), renal transplant with baseline (avoid nephrotoxic meds), ABLA (transfuse if necessary to ensure appropriate perfusion for increased activity tolerance) 3. Due to bladder management, safety, skin/wound care, disease management, pain management and patient education, does the patient require 24 hr/day rehab nursing? Yes 4. Does the patient require coordinated care of a physician, rehab nurse, PT (1-2 hrs/day, 5 days/week) and OT (1-2 hrs/day, 5 days/week) to address physical and functional deficits  in the context of the above medical diagnosis(es)? Yes Addressing deficits in the following areas: balance, endurance, locomotion, strength, transferring, bathing, dressing, toileting and psychosocial support 5. Can the patient actively participate in an intensive therapy program of at least 3 hrs of therapy per day at least 5 days per week? Yes 6. The potential for patient to make measurable gains while on inpatient rehab is excellent 7. Anticipated functional outcomes upon discharge from inpatient rehab are supervision and min assist  with PT, supervision and min assist with OT, n/a with SLP. 8. Estimated rehab length of stay to reach the above functional goals is: 9-14 days. 9. Anticipated D/C setting: Home 10. Anticipated post D/C treatments: HH therapy and Home excercise program 11. Overall Rehab/Functional Prognosis: excellent and good  RECOMMENDATIONS: This patient's condition is appropriate for continued rehabilitative care in the following setting: CIR caregiver support available upon discharge once medically stable (patient currently with fever) Patient has agreed to participate in recommended program. Yes Note that insurance prior authorization may be required for reimbursement for recommended care.  Comment: Rehab Admissions Coordinator to follow up.   I have personally performed a  face to face diagnostic evaluation, including, but not limited to relevant history and physical exam findings, of this patient and developed relevant assessment and plan.  Additionally, I have reviewed and concur with the physician assistant's documentation above.   Maryla MorrowAnkit Laird Runnion, MD, ABPMR Hannah CreePamela S Love, PA-C 03/15/2018

## 2018-03-15 NOTE — Progress Notes (Addendum)
Occupational Therapy Treatment Patient Details Name: Hannah Valentine MRN: [id number] DOB: 08/19/1956 Today's Date: 03/15/2018    History of present illness  62 y.o. female with medical history significant for HTN, CKD stage III with LUE fistula (pt reports cannot lift more than 5 lbs with left due to fistula is tenuous), the patient has a history of kidney transplant in 2017, hyperlipidemia, diabetes on insulin pump, who was brought by the EMS, says she suffered a mechanical fall.  In review, the patient tripped over her dog,and sustained left proximal tibia/fibula fracture; s/p ORIF 03/12/18   OT comments  Patient pleasant and cooperative, highly motivated and progressing well.  She demonstrated ability to complete bed mobility (supine to sit EOB) with minA for management of L LE due to pain, and simulated toilet transfers to drop arm recliner towards R side with moderate assistance (lateral scoot).  She continues to be limited by pain and decreased activity tolerance.  Will continue to follow during admission and continue to recommend CIR in order to maximize independence with self care and functional transfers prior to transition home.    Follow Up Recommendations  CIR    Equipment Recommendations  3 in 1 bedside commode(drop arm)    Recommendations for Other Services      Precautions / Restrictions Precautions Precautions: Fall;Other (comment) Precaution Comments: pt reports renal instructed her no more than 5 lbs lifting with LUE to preserve fistula in case dialysis needed Required Braces or Orthoses: Other Brace/Splint Other Brace/Splint: hinged brace LLE (locked at 0) Restrictions Weight Bearing Restrictions: Yes LLE Weight Bearing: Touchdown weight bearing Other Position/Activity Restrictions: see precautions re: LUE       Mobility Bed Mobility Overal bed mobility: Needs Assistance Bed Mobility: Supine to Sit     Supine to sit: Min assist     General bed mobility comments:  Min A to manage LLE   Transfers Overall transfer level: Needs assistance Equipment used: None Transfers: Lateral/Scoot Transfers          Lateral/Scoot Transfers: Mod assist General transfer comment: Mod A to manage LLE and block R knee, minA to ascend     Balance Overall balance assessment: Needs assistance Sitting-balance support: Feet unsupported;No upper extremity supported Sitting balance-Leahy Scale: Fair Sitting balance - Comments: Able to maintain static sitting.Requiring elevation of LLE for pain management                                    ADL either performed or assessed with clinical judgement   ADL Overall ADL's : Needs assistance/impaired                         Toilet Transfer: Moderate assistance;Requires drop arm(simulated to recliner) Toilet Transfer Details (indicate cue type and reason): moderate assist to manage L LE and ascend during lateral scoot to drop arm recliner towards R side.          Functional mobility during ADLs: Moderate assistance(lateral scoots ) General ADL Comments: Pt motivated to particpate in therapy despite pain and fatigue.  Requires support to L LE during transfers due to pain, min cueing for technique and hand placement.      Vision Baseline Vision/History: Wears glasses Wears Glasses: Reading only Patient Visual Report: No change from baseline     Perception     Praxis      Cognition Arousal/Alertness: Awake/alert Behavior During Therapy:  WFL for tasks assessed/performed Overall Cognitive Status: Within Functional Limits for tasks assessed                                 General Comments: highly motivated to participate in therapy        Exercises     Shoulder Instructions       General Comments      Pertinent Vitals/ Pain       Pain Assessment: Faces Faces Pain Scale: Hurts even more Pain Location: LLE Pain Descriptors / Indicators: Guarding;Operative site  guarding Pain Intervention(s): Limited activity within patient's tolerance;Monitored during session;Ice applied;Repositioned  Home Living                                          Prior Functioning/Environment              Frequency  Min 3X/week        Progress Toward Goals  OT Goals(current goals can now be found in the care plan section)  Progress towards OT goals: Progressing toward goals  Acute Rehab OT Goals Patient Stated Goal: be able to move well enough to return home to stay in downstairs/garage level of home OT Goal Formulation: With patient Time For Goal Achievement: 03/27/18 Potential to Achieve Goals: Good  Plan Discharge plan remains appropriate;Frequency remains appropriate    Co-evaluation                 AM-PAC PT "6 Clicks" Daily Activity     Outcome Measure   Help from another person eating meals?: None Help from another person taking care of personal grooming?: A Little Help from another person toileting, which includes using toliet, bedpan, or urinal?: A Lot Help from another person bathing (including washing, rinsing, drying)?: A Lot Help from another person to put on and taking off regular upper body clothing?: None Help from another person to put on and taking off regular lower body clothing?: A Lot 6 Click Score: 17    End of Session Equipment Utilized During Treatment: Gait belt;Other (comment)(hinged knee brace)  OT Visit Diagnosis: Unsteadiness on feet (R26.81);Other abnormalities of gait and mobility (R26.89);Muscle weakness (generalized) (M62.81);Pain Pain - Right/Left: Left Pain - part of body: Leg   Activity Tolerance Patient tolerated treatment well   Patient Left in chair;with family/visitor present;with chair alarm set;with call bell/phone within reach   Nurse Communication Mobility status;Precautions;Weight bearing status        Time: 1443-1500 OT Time Calculation (min): 17 min  Charges: OT  General Charges $OT Visit: 1 Visit OT Treatments $Self Care/Home Management : 8-22 mins  Hannah Valentine, OTR/L  Pager [phone]   Hannah Valentine 03/15/2018, 3:57 PM

## 2018-03-15 NOTE — Progress Notes (Signed)
PROGRESS NOTE    Hannah Valentine  ZOX:[id number] DOB: October 25, 1955 DOA: 03/11/2018 PCP: [name], PA   Brief Narrative:  Hannah Valentine is a very pleasant 62 y.o. female with medical history significant for HTN, CKD stage III, the patient has a history of kidney transplant in 2017, hyperlipidemia, diabetes on insulin pump, presents with mechanical fall, sustained a tibial fracture, status post surgical repair by Dr. [name]    Subjective  Denies any complaints today, but reports no bowel movement yet, she had low-grade temperature 100.4 overnight, denies cough, dysuria or chills .  Assessment & Plan:   Principal Problem:   Left tibial fracture Active Problems:   Fall   History of kidney transplant   Essential hypertension   HLD (hyperlipidemia)   CKD (chronic kidney disease), stage III (HCC)   DM (diabetes mellitus), type 1 (HCC)   Closed fracture of upper end of left fibula   Left tibial fracture;  -Secondary to mechanical fall, S/P ORIF of left tibial shaft fracture.  - Management per ORTHO.  -Followed by PT, CIR consulted.  DM type 1; -She is on insulin pump, she is quite comfortable with using the pump, DM coordinator helping with the pump, she reports last A1c was 6.4, overall CBG are controlled, no recurrent hypoglycemia .   CKD stage III - status post renal transplant 2017, on oral immunosuppressant therapy. Baseline getting around 1.7, function stable around baseline, renal input greatly appreciated, continue with home dose Lasix and antihypertensive regimen. - Continue prednisone, Bactrim, Prograf - Avoid hypotension, nephrotoxin.   Patient with fever 100.4 overnight, she denies any complaints, no cough, no dysuria, he is nontoxic-appearing, encouraged to use incentive spirometry, discussed with ID given she is neuro suppressive therapy secondary to renal transplant, given the fact her transplant more than 2 years ago, no extra work-up is indicated, she was encouraged  to use incentive spirometry, I will repeat UA and chest x-ray.  HTN;  -Resume home regimen, renal input greatly appreciated.   DVT prophylaxis: Subcu heparin Code Status; full code.  Family Communication: Husband at bedside Disposition Plan: CIR consult requested   Consultants:   ortho    Procedures:   S/P ORIF of left tibial shaft fracture   Antimicrobials:  Ancef prophylaxis.     Objective: Vitals:   03/14/18 1200 03/14/18 1445 03/14/18 2200 03/15/18 0400  BP: (!) 146/45 (!) 136/46 (!) 101/59 (!) 163/61  Pulse: 63 66 66 88  Resp: 18 18 18  (!) 22  Temp: 98.9 F (37.2 C) 98 F (36.7 C) (!) 100.4 F (38 C) (!) 97.4 F (36.3 C)  TempSrc: Oral Oral Oral Oral  SpO2:  96%    Weight:      Height:        Intake/Output Summary (Last 24 hours) at 03/15/2018 1441 Last data filed at 03/15/2018 1000 Gross per 24 hour  Intake 480 ml  Output 2900 ml  Net -2420 ml   Filed Weights   03/11/18 1000 03/13/18 1237  Weight: 90.7 kg (200 lb) 99.5 kg (219 lb 5.7 oz)    Examination:  Awake Alert, Oriented X 3, No new F.N deficits, Normal affect Symmetrical Chest wall movement, Good air movement bilaterally, CTAB RRR,No Gallops,Rubs or new Murmurs, No Parasternal Heave +ve B.Sounds, Abd Soft, No tenderness, No organomegaly appriciated, No rebound - guarding or rigidity. No Cyanosis, Clubbing or edema, No new Rash or bruise ,left lower extremity with some swelling wrapped, with immobilizer    Data Reviewed: I have  personally reviewed following labs and imaging studies  CBC: Recent Labs  Lab 03/11/18 1103 03/13/18 0331 03/14/18 0403 03/15/18 0343  WBC 7.2 8.7 7.6 7.2  NEUTROABS 5.5  --   --   --   HGB 14.0 11.6* 11.4* 10.6*  HCT 43.9 37.3 36.7 33.3*  MCV 96.7 99.2 99.2 96.2  PLT 182 152 149* 159   Basic Metabolic Panel: Recent Labs  Lab 03/11/18 1103 03/12/18 1004 03/13/18 0833 03/14/18 0403 03/15/18 0343  NA 142 139 135 136 138  K 3.7 4.0 3.7 3.8 3.6    CL 109 109 102 101 103  CO2 24 22 22 24 25   GLUCOSE 89 141* 144* 170* 181*  BUN 36* 35* 28* 29* 33*  CREATININE 1.84* 1.99* 1.62* 1.89* 1.81*  CALCIUM 8.5* 8.2* 8.3* 8.4* 8.2*   GFR: Estimated Creatinine Clearance: 39.1 mL/min (A) (by C-G formula based on SCr of 1.81 mg/dL (H)). Liver Function Tests: No results for input(s): AST, ALT, ALKPHOS, BILITOT, PROT, ALBUMIN in the last 168 hours. No results for input(s): LIPASE, AMYLASE in the last 168 hours. No results for input(s): AMMONIA in the last 168 hours. Coagulation Profile: Recent Labs  Lab 03/11/18 1103  INR 1.08   Cardiac Enzymes: Recent Labs  Lab 03/11/18 1550  TROPONINI <0.03   BNP (last 3 results) No results for input(s): PROBNP in the last 8760 hours. HbA1C: No results for input(s): HGBA1C in the last 72 hours. CBG: Recent Labs  Lab 03/14/18 1209 03/14/18 1615 03/14/18 2327 03/15/18 0501 03/15/18 1142  GLUCAP 132* 121* 145* 177* 77   Lipid Profile: No results for input(s): CHOL, HDL, LDLCALC, TRIG, CHOLHDL, LDLDIRECT in the last 72 hours. Thyroid Function Tests: No results for input(s): TSH, T4TOTAL, FREET4, T3FREE, THYROIDAB in the last 72 hours. Anemia Panel: No results for input(s): VITAMINB12, FOLATE, FERRITIN, TIBC, IRON, RETICCTPCT in the last 72 hours. Sepsis Labs: No results for input(s): PROCALCITON, LATICACIDVEN in the last 168 hours.  Recent Results (from the past 240 hour(s))  MRSA PCR Screening     Status: None   Collection Time: 03/11/18  6:21 PM  Result Value Ref Range Status   MRSA by PCR NEGATIVE NEGATIVE Final    Comment:        The GeneXpert MRSA Assay (FDA approved for NASAL specimens only), is one component of a comprehensive MRSA colonization surveillance program. It is not intended to diagnose MRSA infection nor to guide or monitor treatment for MRSA infections. Performed at [facility], [address], [place], [place] [address]          Radiology  Studies: No results found.      Scheduled Meds: . amLODipine  5 mg Oral Daily  . doxazosin  4 mg Oral QPM  . ferrous sulfate  325 mg Oral Daily  . furosemide  80 mg Oral Daily  . heparin injection (subcutaneous)  5,000 Units Subcutaneous Q8H  . hydrALAZINE  100 mg Oral Q8H  . insulin pump   Subcutaneous TID AC & HS  . isosorbide mononitrate  60 mg Oral BID  . metoprolol tartrate  50 mg Oral BID  . mycophenolate  360 mg Oral Daily   And  . mycophenolate  180 mg Oral QPM  . polyethylene glycol  34 g Oral Once  . pravastatin  40 mg Oral QHS  . predniSONE  5 mg Oral QAC breakfast  . sulfamethoxazole-trimethoprim  1 tablet Oral Q M,W,F  . tacrolimus  2 mg Oral  BID   Continuous Infusions:    LOS: 4 days     Huey Bienenstockawood Ahamed Hofland, MD [facility] Pager [phone]  If 7PM-7AM, please contact night-coverage [email or web address] Password [email or web address] 03/15/2018, 2:41 PM

## 2018-03-16 ENCOUNTER — Inpatient Hospital Stay (HOSPITAL_COMMUNITY): Payer: Medicare Other

## 2018-03-16 ENCOUNTER — Inpatient Hospital Stay (HOSPITAL_COMMUNITY): Payer: Medicare Other | Admitting: Physical Therapy

## 2018-03-16 DIAGNOSIS — M79609 Pain in unspecified limb: Secondary | ICD-10-CM

## 2018-03-16 DIAGNOSIS — Z833 Family history of diabetes mellitus: Secondary | ICD-10-CM

## 2018-03-16 DIAGNOSIS — E1121 Type 2 diabetes mellitus with diabetic nephropathy: Secondary | ICD-10-CM

## 2018-03-16 DIAGNOSIS — W19XXXA Unspecified fall, initial encounter: Secondary | ICD-10-CM

## 2018-03-16 DIAGNOSIS — S82202A Unspecified fracture of shaft of left tibia, initial encounter for closed fracture: Principal | ICD-10-CM

## 2018-03-16 DIAGNOSIS — Z8619 Personal history of other infectious and parasitic diseases: Secondary | ICD-10-CM

## 2018-03-16 DIAGNOSIS — M7989 Other specified soft tissue disorders: Secondary | ICD-10-CM

## 2018-03-16 DIAGNOSIS — Z888 Allergy status to other drugs, medicaments and biological substances status: Secondary | ICD-10-CM

## 2018-03-16 DIAGNOSIS — Z885 Allergy status to narcotic agent status: Secondary | ICD-10-CM

## 2018-03-16 DIAGNOSIS — D709 Neutropenia, unspecified: Secondary | ICD-10-CM

## 2018-03-16 DIAGNOSIS — Z87891 Personal history of nicotine dependence: Secondary | ICD-10-CM

## 2018-03-16 DIAGNOSIS — R609 Edema, unspecified: Secondary | ICD-10-CM

## 2018-03-16 DIAGNOSIS — R5081 Fever presenting with conditions classified elsewhere: Secondary | ICD-10-CM

## 2018-03-16 LAB — BASIC METABOLIC PANEL
Anion gap: 11 (ref 5–15)
BUN: 30 mg/dL — ABNORMAL HIGH (ref 6–20)
CO2: 24 mmol/L (ref 22–32)
Calcium: 8 mg/dL — ABNORMAL LOW (ref 8.9–10.3)
Chloride: 101 mmol/L (ref 101–111)
Creatinine, Ser: 1.71 mg/dL — ABNORMAL HIGH (ref 0.44–1.00)
GFR calc Af Amer: 36 mL/min — ABNORMAL LOW (ref >60)
GFR calc non Af Amer: 31 mL/min — ABNORMAL LOW (ref >60)
Glucose, Bld: 128 mg/dL — ABNORMAL HIGH (ref 65–99)
Potassium: 3.4 mmol/L — ABNORMAL LOW (ref 3.5–5.1)
Sodium: 136 mmol/L (ref 135–145)

## 2018-03-16 LAB — GLUCOSE, CAPILLARY
GLUCOSE-CAPILLARY: 118 mg/dL — AB (ref 65–99)
GLUCOSE-CAPILLARY: 94 mg/dL (ref 65–99)
GLUCOSE-CAPILLARY: 264 mg/dL — AB (ref 65–99)
Glucose-Capillary: 160 mg/dL — ABNORMAL HIGH (ref 65–99)

## 2018-03-16 LAB — CBC
HCT: 34.2 % — ABNORMAL LOW (ref 36.0–46.0)
Hemoglobin: 11.1 g/dL — ABNORMAL LOW (ref 12.0–15.0)
MCH: 31.1 pg (ref 26.0–34.0)
MCHC: 32.5 g/dL (ref 30.0–36.0)
MCV: 95.8 fL (ref 78.0–100.0)
Platelets: 172 10*3/uL (ref 150–400)
RBC: 3.57 MIL/uL — ABNORMAL LOW (ref 3.87–5.11)
RDW: 14.3 % (ref 11.5–15.5)
WBC: 9.3 10*3/uL (ref 4.0–10.5)

## 2018-03-16 MED ORDER — BISACODYL 10 MG RE SUPP
10.0000 mg | Freq: Once | RECTAL | Status: AC
  Administered 2018-03-16: 10 mg via RECTAL
  Filled 2018-03-16: qty 1

## 2018-03-16 MED ORDER — HEPARIN SODIUM (PORCINE) 5000 UNIT/ML IJ SOLN
5000.0000 [IU] | Freq: Three times a day (TID) | INTRAMUSCULAR | Status: DC
  Administered 2018-03-16 – 2018-03-22 (×13): 5000 [IU] via SUBCUTANEOUS
  Filled 2018-03-16 (×16): qty 1

## 2018-03-16 MED ORDER — FUROSEMIDE 10 MG/ML IJ SOLN
60.0000 mg | Freq: Two times a day (BID) | INTRAMUSCULAR | Status: DC
  Administered 2018-03-16 – 2018-03-17 (×2): 60 mg via INTRAVENOUS
  Filled 2018-03-16 (×2): qty 6

## 2018-03-16 MED ORDER — AMLODIPINE BESYLATE 10 MG PO TABS
10.0000 mg | ORAL_TABLET | Freq: Every day | ORAL | Status: DC
  Administered 2018-03-16 – 2018-03-22 (×7): 10 mg via ORAL
  Filled 2018-03-16 (×7): qty 1

## 2018-03-16 MED ORDER — BISACODYL 10 MG RE SUPP: 10.0000 mg | Freq: Every day | RECTAL | Status: DC | PRN

## 2018-03-16 MED ORDER — FUROSEMIDE 10 MG/ML IJ SOLN: 60.0000 mg | Freq: Two times a day (BID) | INTRAMUSCULAR | Status: DC

## 2018-03-16 NOTE — Consult Note (Signed)
[facility]       Reason for Consult: fever    Referring Physician: Dr. Randol Kern  Principal Problem:   Left tibial fracture Active Problems:   Fall   History of kidney transplant   Essential hypertension   HLD (hyperlipidemia)   CKD (chronic kidney disease), stage III (HCC)   DM (diabetes mellitus), type 1 (HCC)   Closed fracture of upper end of left fibula   Trauma   FUO (fever of unknown origin)   Hypertensive crisis   Prediabetes   Acute blood loss anemia   . amLODipine  10 mg Oral Daily  . doxazosin  4 mg Oral QPM  . ferrous sulfate  325 mg Oral Daily  . furosemide  80 mg Oral Daily  . heparin injection (subcutaneous)  5,000 Units Subcutaneous Q8H  . hydrALAZINE  100 mg Oral Q8H  . insulin pump   Subcutaneous TID AC & HS  . isosorbide mononitrate  60 mg Oral BID  . metoprolol tartrate  50 mg Oral BID  . mycophenolate  360 mg Oral Daily   And  . mycophenolate  180 mg Oral QPM  . polyethylene glycol  34 g Oral Once  . pravastatin  40 mg Oral QHS  . predniSONE  5 mg Oral QAC breakfast  . sulfamethoxazole-trimethoprim  1 tablet Oral Q M,W,F  . 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  She developed a fever up to 100.9 of unknown etiology.    Antibiotics: Prophylactic Bactrim  HPI: Hannah Valentine is a 62 y.o. 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  Some sweats last night, mild cough, no GI discomfort or diarrhea.  No associated rash.  Some arthralgias but no arthritis.  Has not traveled anywhere recently.  Recently decreased her Prograf.  Record reviewed in Epic, recent increased creat that has resolved.  Recent CMV negative.     Review of Systems:  Respiratory: positive for cough, negative for sputum or dyspnea on exertion Genitourinary: negative for frequency, dysuria and vaginal discharge Musculoskeletal: negative for myalgias All other systems reviewed and are negative    Past Medical History:  Diagnosis Date  . CKD (chronic kidney disease), stage III (HCC)   . Complication of anesthesia   . Essential hypertension   . HLD (hyperlipidemia)   . Kidney transplanted 2017  . PONV (postoperative nausea and vomiting)   . Type II diabetes mellitus with renal manifestations (HCC)     Social History   Tobacco Use  . Smoking status: Former Games developer  . Smokeless tobacco: Never Used  . Tobacco comment: QUIT IN 2013  Substance Use Topics  . Alcohol use: No    Frequency: Never  . Drug use: No    Family History  Problem Relation Age of Onset  . CAD Mother   . Lung cancer Mother   . Stroke Father   . Heart attack Father   . Diabetes Mellitus I Father   . Hypertension Father   . Breast cancer Sister   . Hypertension Sister   . Heart attack Brother     Allergies  Allergen Reactions  . Atorvastatin Other (See Comments)  Muscle weakness  . Hydrocodone-Acetaminophen Nausea Only  . Labetalol Other (See Comments)    "makes her feel bad"  . Meperidine Nausea Only  . Pregabalin Other (See Comments)    dizzy    Physical Exam: Constitutional: in no apparent distress and alert  Vitals:   03/15/18 2122 03/16/18 0526  BP: (!) 174/71 (!) 188/63  Pulse: 84 77  Resp: 18 17  Temp: 98.3 F (36.8 C) 100 F (37.8 C)  SpO2: 96% 95%   EYES: anicteric ENMT:no thrush Cardiovascular: Cor RRR Respiratory: CTA B; normal respiratory effort GI: Bowel sounds are normal, liver is not enlarged, spleen is not enlarged Musculoskeletal: no pedal edema noted Skin: negatives: no rash Hematologic: no cervical lad  Lab Results  Component Value Date   WBC 9.3 03/16/2018   HGB 11.1 (L) 03/16/2018   HCT 34.2 (L)  03/16/2018   MCV 95.8 03/16/2018   PLT 172 03/16/2018    Lab Results  Component Value Date   CREATININE 1.71 (H) 03/16/2018   BUN 30 (H) 03/16/2018   NA 136 03/16/2018   K 3.4 (L) 03/16/2018   CL 101 03/16/2018   CO2 24 03/16/2018    Lab Results  Component Value Date   ALT 20 10/03/2017   AST 33 10/03/2017   ALKPHOS 62 10/03/2017     Microbiology: Recent Results (from the past 240 hour(s))  MRSA PCR Screening     Status: None   Collection Time: 03/11/18  6:21 PM  Result Value Ref Range Status   MRSA by PCR NEGATIVE NEGATIVE Final    Comment:        The GeneXpert MRSA Assay (FDA approved for NASAL specimens only), is one component of a comprehensive MRSA colonization surveillance program. It is not intended to diagnose MRSA infection nor to guide or monitor treatment for MRSA infections. Performed at [facility], [address] [place], [place] [address]     Gardiner Barefootobert W Weston Fulco, MD [facility] [email or web address] [phone] pager  [phone] cell 03/16/2018, 11:29 AM

## 2018-03-16 NOTE — Progress Notes (Signed)
Orthopedic Trauma Service Progress Note   Patient ID: Hannah Valentine MRN: [id number] DOB/AGE: 03/06/1956 62 y.o.  Subjective:  Asked to re-eval L leg as pt still having some intermittent low grade fevers   cxr and u/a negative to date      ROS  As above   Objective:   VITALS:   Vitals:   03/15/18 1939 03/15/18 2055 03/15/18 2122 03/16/18 0526  BP: (!) 140/50 (!) 160/53 (!) 174/71 (!) 188/63  Pulse: 73 70 84 77  Resp:  18 18 17   Temp: 100 F (37.8 C) 99.5 F (37.5 C) 98.3 F (36.8 C) 100 F (37.8 C)  TempSrc: Oral Oral Oral Oral  SpO2: 98% 97% 96% 95%  Weight:      Height:        Estimated body mass index is 34.56 kg/m as calculated from the following:   Height as of this encounter: 5\' 7"  (1.702 m).   Weight as of this encounter: 100.1 kg (220 lb 10.9 oz).   Intake/Output      06/10 0701 - 06/11 0700 06/11 0701 - 06/12 0700   P.O. 480    Total Intake(mL/kg) 480 (4.8)    Urine (mL/kg/hr) 2600 (1.1)    Total Output 2600    Net -2120           LABS  Results for orders placed or performed during the hospital encounter of 03/11/18 (from the past 24 hour(s))  Glucose, capillary     Status: None   Collection Time: 03/15/18 11:42 AM  Result Value Ref Range   Glucose-Capillary 77 65 - 99 mg/dL   Comment 1 Notify RN    Comment 2 Document in Chart   Glucose, capillary     Status: Abnormal   Collection Time: 03/15/18  4:38 PM  Result Value Ref Range   Glucose-Capillary 157 (H) 65 - 99 mg/dL  Urinalysis, Routine w reflex microscopic     Status: Abnormal   Collection Time: 03/15/18  7:14 PM  Result Value Ref Range   Color, Urine YELLOW YELLOW   APPearance CLEAR CLEAR   Specific Gravity, Urine 1.008 1.005 - 1.030   pH 6.0 5.0 - 8.0   Glucose, UA NEGATIVE NEGATIVE mg/dL   Hgb urine dipstick SMALL (A) NEGATIVE   Bilirubin Urine NEGATIVE NEGATIVE   Ketones, ur NEGATIVE NEGATIVE mg/dL   Protein, ur NEGATIVE NEGATIVE mg/dL   Nitrite  NEGATIVE NEGATIVE   Leukocytes, UA NEGATIVE NEGATIVE   RBC / HPF 0-5 0 - 5 RBC/hpf   WBC, UA 0-5 0 - 5 WBC/hpf   Bacteria, UA NONE SEEN NONE SEEN  Glucose, capillary     Status: None   Collection Time: 03/15/18 10:00 PM  Result Value Ref Range   Glucose-Capillary 82 65 - 99 mg/dL  CBC     Status: Abnormal   Collection Time: 03/16/18  4:35 AM  Result Value Ref Range   WBC 9.3 4.0 - 10.5 K/uL   RBC 3.57 (L) 3.87 - 5.11 MIL/uL   Hemoglobin 11.1 (L) 12.0 - 15.0 g/dL   HCT 40.9 (L) 81.1 - 91.4 %   MCV 95.8 78.0 - 100.0 fL   MCH 31.1 26.0 - 34.0 pg   MCHC 32.5 30.0 - 36.0 g/dL   RDW 78.2 95.6 - 21.3 %   Platelets 172 150 - 400 K/uL  Basic metabolic panel     Status: Abnormal   Collection Time: 03/16/18  4:35 AM  Result Value Ref Range  Sodium 136 135 - 145 mmol/L   Potassium 3.4 (L) 3.5 - 5.1 mmol/L   Chloride 101 101 - 111 mmol/L   CO2 24 22 - 32 mmol/L   Glucose, Bld 128 (H) 65 - 99 mg/dL   BUN 30 (H) 6 - 20 mg/dL   Creatinine, Ser 0.98 (H) 0.44 - 1.00 mg/dL   Calcium 8.0 (L) 8.9 - 10.3 mg/dL   GFR calc non Af Amer 31 (L) >60 mL/min   GFR calc Af Amer 36 (L) >60 mL/min   Anion gap 11 5 - 15  Glucose, capillary     Status: Abnormal   Collection Time: 03/16/18  6:25 AM  Result Value Ref Range   Glucose-Capillary 160 (H) 65 - 99 mg/dL     PHYSICAL EXAM:   Gen: resting comfortably in bed, NAD  Ext:       Left Lower Extremity   L leg looks stable  Moderate ecchymosis and some erythema around sutures but no diffuse erythema   Mild serosang drainage noted   Marked swelling of her L leg and foot   Motor and sensory functions intact    + DP pulse  Compartments are soft   No pain with passive stretch   Heel cord remains tight     Assessment/Plan: 4 Days Post-Op   Principal Problem:   Left tibial fracture Active Problems:   Fall   History of kidney transplant   Essential hypertension   HLD (hyperlipidemia)   CKD (chronic kidney disease), stage III (HCC)   DM  (diabetes mellitus), type 1 (HCC)   Closed fracture of upper end of left fibula   Trauma   FUO (fever of unknown origin)   Hypertensive crisis   Prediabetes   Acute blood loss anemia   Anti-infectives (From admission, onward)   Start     Dose/Rate Route Frequency Ordered Stop   03/12/18 1930  ceFAZolin (ANCEF) IVPB 2g/100 mL premix     2 g 200 mL/hr over 30 Minutes Intravenous Every 8 hours 03/12/18 1518 03/13/18 1444   03/12/18 1000  sulfamethoxazole-trimethoprim (BACTRIM,SEPTRA) 400-80 MG per tablet 1 tablet     1 tablet Oral Every M-W-F 03/11/18 1452     03/12/18 0600  ceFAZolin (ANCEF) IVPB 2g/100 mL premix     2 g 200 mL/hr over 30 Minutes Intravenous On call to O.R. 03/11/18 2110 03/12/18 1137    .  POD/HD#: 51  62 y/o female s/p fall with L proximal tibia fracture, hx of renal transplant   -L proximal tibia fracture s/p ORIF  TDWB L leg  Unrestricted ROM L Knee and ankle   PT/OT  Ice   Hinged brace unlocked   TED hose for swelling control   Dry dressing or leave open to air once drainage ceases   Ok to clean with soap and water only   Ok to shower    PT- please teach HEP for L knee ROM- AROM, PROM. Prone exercises as well. No ROM restrictions.  Quad sets, SLR, LAQ, SAQ, heel slides, stretching, prone flexion and extension  Ankle theraband program, heel cord stretching, toe towel curls, etc  No pillows under bend of knee when at rest, ok to place under heel to help work on extension. Can also use zero knee bone foam if available    I do not think her leg is the source of her low grade fever in the sense of active infection   Definitely inflammatory response present due to recent  surgery.    - DVT/PE prophylaxis  Sq heparin   - Dispo:  Continue per primary   Follow up with ortho in 10-14 days  Will follow along  CIR once medically stable    Mearl LatinKeith W. Jamarquis Crull, PA-C [facility] [phone](P) [phone] Traci Sermon(O) [phone] (C) 03/16/2018,  10:17 AM

## 2018-03-16 NOTE — Progress Notes (Signed)
Preliminary notes--Bilateral lower extremities venous duplex exam completed. Negative for DVT. Left calf veins could not perform compression due to patient condition, but calf veins demonstrate patent with flow detected.     Hongying Shriyans Kuenzi (RDMS RVT) 03/16/18 9:41 AM

## 2018-03-16 NOTE — Progress Notes (Signed)
Occupational Therapy Treatment Patient Details Name: Hannah Valentine MRN: [id number] DOB: 1956/08/06 Today's Date: 03/16/2018    History of present illness  62 y.o. female with medical history significant for HTN, CKD stage III with LUE fistula (pt reports cannot lift more than 5 lbs with left due to fistula is tenuous), the patient has a history of kidney transplant in 2017, hyperlipidemia, diabetes on insulin pump, who was brought by the EMS, says she suffered a mechanical fall.  In review, the patient tripped over her dog,and sustained left proximal tibia/fibula fracture; s/p ORIF 03/12/18   OT comments  Patient pleasant and cooperative.  Patient progressing well, continues to require minA for bed mobility to manage L LE for pain management and safety.  Patient completed drop arm BSC transfer towards L side today with +2 for safety, nursing managing L LE (pain mgmt, pain increases with L LE unsupported and patient just received pain medication for the first time today- per RN) and therapist providing minA to min guard assistance given cueing for hand placement and technique with lateral scoot.  Educated on compensatory techniques with toileting from a seated position, lateral shifting; patient verbalized understanding. Patient will continue to benefit from skilled OT services while admitted, and continue to recommend CIR at discharge in order to maximize independence prior to dc home.    Follow Up Recommendations  CIR    Equipment Recommendations  3 in 1 bedside commode(drop arm)    Recommendations for Other Services      Precautions / Restrictions Precautions Precautions: Fall;Other (comment) Precaution Comments: pt reports renal instructed her no more than 5 lbs lifting with LUE to preserve fistula in case dialysis needed Required Braces or Orthoses: Other Brace/Splint Other Brace/Splint: hinged brace LLE (locked at 0) Restrictions Weight Bearing Restrictions: Yes LLE Weight Bearing:  Touchdown weight bearing Other Position/Activity Restrictions: see precautions re: LUE       Mobility Bed Mobility Overal bed mobility: Needs Assistance Bed Mobility: Rolling;Supine to Sit Rolling: Min guard(towards L side, cueing for technique)   Supine to sit: Min assist;HOB elevated     General bed mobility comments: Min A to manage LLE   Transfers Overall transfer level: Needs assistance Equipment used: None Transfers: Lateral/Scoot Transfers          Lateral/Scoot Transfers: Min assist;+2 safety/equipment General transfer comment: +2 for safety, nursing managing L LE for pain mgmt and safety, min to min guard assist for lateral transfer onto drop arm BSC towards L side     Balance Overall balance assessment: Needs assistance Sitting-balance support: Feet unsupported;No upper extremity supported   Sitting balance - Comments: Able to maintain static sitting. Requiring elevation of LLE for pain management                                    ADL either performed or assessed with clinical judgement   ADL Overall ADL's : Needs assistance/impaired                         Toilet Transfer: Requires drop arm;+2 for safety/equipment;BSC;Minimal assistance Toilet Transfer Details (indicate cue type and reason): total assist to support L LE and min guard for lateral transfer to drop arm BSC towards L side; +2 for safety           Functional mobility during ADLs: Minimal assistance(lateral scoot ) General ADL Comments: patient motivated to participate  despite decreased activity tolerance and recent fever; nursing assisted with management of L LE during functional transfer for pain management and safety      Vision Baseline Vision/History: Wears glasses Wears Glasses: Reading only Patient Visual Report: No change from baseline     Perception     Praxis      Cognition Arousal/Alertness: Awake/alert Behavior During Therapy: WFL for tasks  assessed/performed Overall Cognitive Status: Within Functional Limits for tasks assessed                                 General Comments: highly motivated to participate in therapy        Exercises     Shoulder Instructions       General Comments husband present throughout session     Pertinent Vitals/ Pain       Pain Assessment: Faces Faces Pain Scale: Hurts little more Pain Location: LLE Pain Descriptors / Indicators: Guarding;Operative site guarding Pain Intervention(s): Limited activity within patient's tolerance;Monitored during session;Repositioned;RN gave pain meds during session  Home Living                                          Prior Functioning/Environment              Frequency  Min 3X/week        Progress Toward Goals  OT Goals(current goals can now be found in the care plan section)  Progress towards OT goals: Progressing toward goals  Acute Rehab OT Goals Patient Stated Goal: be able to move well enough to return home to stay in downstairs/garage level of home OT Goal Formulation: With patient Time For Goal Achievement: 03/27/18 Potential to Achieve Goals: Good  Plan Discharge plan remains appropriate;Frequency remains appropriate    Co-evaluation                 AM-PAC PT "6 Clicks" Daily Activity     Outcome Measure   Help from another person eating meals?: None Help from another person taking care of personal grooming?: A Little Help from another person toileting, which includes using toliet, bedpan, or urinal?: A Lot Help from another person bathing (including washing, rinsing, drying)?: A Lot Help from another person to put on and taking off regular upper body clothing?: None Help from another person to put on and taking off regular lower body clothing?: A Lot 6 Click Score: 17    End of Session Equipment Utilized During Treatment: Gait belt;Other (comment)(hinged knee brace)  OT Visit  Diagnosis: Unsteadiness on feet (R26.81);Other abnormalities of gait and mobility (R26.89);Muscle weakness (generalized) (M62.81);Pain Pain - Right/Left: Left Pain - part of body: Leg   Activity Tolerance Patient tolerated treatment well   Patient Left Other (comment);with call bell/phone within reach;[facility]   Nurse Communication Mobility status;Precautions;Weight bearing status(patients position upon therapist exit)        Time: 1451-1520 OT Time Calculation (min): 29 min  Charges: OT General Charges $OT Visit: 1 Visit OT Treatments $Self Care/Home Management : 23-37 mins  Chancy Milroyhristie S Aarsh Fristoe, OTR/L  Pager [phone]   Chancy MilroyChristie S Sevag Shearn 03/16/2018, 3:38 PM

## 2018-03-16 NOTE — Progress Notes (Signed)
PT Cancellation Note  Patient Details Name: Hannah Valentine MRN: [id number] DOB: 11/10/1955   Cancelled Treatment:    Reason Eval/Treat Not Completed: (P) Patient at procedure or test/unavailable(Pt still sitting on BSC awaiting BM, unlocked hinged knee brace as it was resting in locked position and ortho PA reports to unlock brace.  Will f/u per POC.  )   [name] [name] [name] 03/16/2018, 3:49 PM  Joycelyn RuaAimee Andrez Lieurance, PTA pager [phone]

## 2018-03-16 NOTE — Progress Notes (Signed)
PROGRESS NOTE    Hannah Valentine  ZOX:[id number] DOB: Apr 16, 1956 DOA: 03/11/2018 PCP: [name], PA   Brief Narrative:  Hannah Valentine is a very pleasant 62 y.o. female with medical history significant for HTN, CKD stage III, the patient has a history of kidney transplant in 2017, hyperlipidemia, diabetes on insulin pump, presents with mechanical fall, sustained a tibial fracture, status post surgical repair by Dr. [name] , patient continues to have intermittent fever infectious work-up remains negative.   Subjective  She did with intermittent fever overnight, T-max 100.9 overnight , reports she is feeling weak, denies any cough, or dysuria .  Assessment & Plan:   Principal Problem:   Left tibial fracture Active Problems:   Fall   History of kidney transplant   Essential hypertension   HLD (hyperlipidemia)   CKD (chronic kidney disease), stage III (HCC)   DM (diabetes mellitus), type 1 (HCC)   Closed fracture of upper end of left fibula   Trauma   FUO (fever of unknown origin)   Hypertensive crisis   Prediabetes   Acute blood loss anemia   Left tibial fracture;  -Secondary to mechanical fall, S/P ORIF of left tibial shaft fracture.  - Management per ORTHO.  -Followed by PT, CIR consulted.  DM type 1; -She is on insulin pump, she is quite comfortable with using the pump, DM coordinator helping with the pump, she reports last A1c was 6.4, overall CBG are controlled, no recurrent hypoglycemia .   CKD stage III - status post renal transplant 2017, on oral immunosuppressant therapy. Baseline getting around 1.7, function stable around baseline, renal input greatly appreciated, continue with home dose Lasix and antihypertensive regimen. - Continue prednisone, Bactrim, Prograf - Avoid hypotension, nephrotoxin.  Fever -Patient continues to have intermittent fever, analysis negative, chest x-ray is negative, blood culture with no growth to date, she is on Bactrim for  prophylaxis. -patient has been refusing her DVT prophylaxis subcu heparin since admission, ordered stat venous Doppler bilateral lower extremity to rule out DVT.   HTN;  -Blood pressure significantly uncontrolled over last 24 hours, is back on her home medication including Cardura, hydralazine, Imdur and metoprolol, I have increased Norvasc from 5 to 10 mg oral daily, continue with as needed hydralazine.   DVT prophylaxis: Subcu heparin(she is refusing) Code Status; full code.  Family Communication: none at bedside Disposition Plan: CIR consulted    Consultants:   ortho   renal   Procedures:   S/P ORIF of left tibial shaft fracture   Antimicrobials:  Ancef prophylaxis.     Objective: Vitals:   03/15/18 1939 03/15/18 2055 03/15/18 2122 03/16/18 0526  BP: (!) 140/50 (!) 160/53 (!) 174/71 (!) 188/63  Pulse: 73 70 84 77  Resp:  18 18 17   Temp: 100 F (37.8 C) 99.5 F (37.5 C) 98.3 F (36.8 C) 100 F (37.8 C)  TempSrc: Oral Oral Oral Oral  SpO2: 98% 97% 96% 95%  Weight:      Height:        Intake/Output Summary (Last 24 hours) at 03/16/2018 0839 Last data filed at 03/16/2018 0528 Gross per 24 hour  Intake 480 ml  Output 2600 ml  Net -2120 ml   Filed Weights   03/11/18 1000 03/13/18 1237 03/15/18 1700  Weight: 90.7 kg (200 lb) 99.5 kg (219 lb 5.7 oz) 100.1 kg (220 lb 10.9 oz)    Examination:   Awake Alert, Oriented X 3, No new F.N deficits, Normal affect Symmetrical Chest  wall movement, Good air movement bilaterally, CTAB RRR,No Gallops,Rubs or new Murmurs, No Parasternal Heave +ve B.Sounds, Abd Soft, No tenderness, No organomegaly appriciated, No rebound - guarding or rigidity. No Cyanosis, Clubbing or edema, No new Rash or bruise , lower extremity with no edema, left lower extremity with some swelling wrapped with Kerlix, and has immobilizer.    Data Reviewed: I have personally reviewed following labs and imaging studies  CBC: Recent Labs  Lab  03/11/18 1103 03/13/18 0331 03/14/18 0403 03/15/18 0343 03/16/18 0435  WBC 7.2 8.7 7.6 7.2 9.3  NEUTROABS 5.5  --   --   --   --   HGB 14.0 11.6* 11.4* 10.6* 11.1*  HCT 43.9 37.3 36.7 33.3* 34.2*  MCV 96.7 99.2 99.2 96.2 95.8  PLT 182 152 149* 159 172   Basic Metabolic Panel: Recent Labs  Lab 03/12/18 1004 03/13/18 0833 03/14/18 0403 03/15/18 0343 03/16/18 0435  NA 139 135 136 138 136  K 4.0 3.7 3.8 3.6 3.4*  CL 109 102 101 103 101  CO2 22 22 24 25 24   GLUCOSE 141* 144* 170* 181* 128*  BUN 35* 28* 29* 33* 30*  CREATININE 1.99* 1.62* 1.89* 1.81* 1.71*  CALCIUM 8.2* 8.3* 8.4* 8.2* 8.0*   GFR: Estimated Creatinine Clearance: 41.5 mL/min (A) (by C-G formula based on SCr of 1.71 mg/dL (H)). Liver Function Tests: No results for input(s): AST, ALT, ALKPHOS, BILITOT, PROT, ALBUMIN in the last 168 hours. No results for input(s): LIPASE, AMYLASE in the last 168 hours. No results for input(s): AMMONIA in the last 168 hours. Coagulation Profile: Recent Labs  Lab 03/11/18 1103  INR 1.08   Cardiac Enzymes: Recent Labs  Lab 03/11/18 1550  TROPONINI <0.03   BNP (last 3 results) No results for input(s): PROBNP in the last 8760 hours. HbA1C: No results for input(s): HGBA1C in the last 72 hours. CBG: Recent Labs  Lab 03/14/18 2327 03/15/18 0501 03/15/18 1142 03/15/18 1638 03/15/18 2200  GLUCAP 145* 177* 77 157* 82   Lipid Profile: No results for input(s): CHOL, HDL, LDLCALC, TRIG, CHOLHDL, LDLDIRECT in the last 72 hours. Thyroid Function Tests: No results for input(s): TSH, T4TOTAL, FREET4, T3FREE, THYROIDAB in the last 72 hours. Anemia Panel: No results for input(s): VITAMINB12, FOLATE, FERRITIN, TIBC, IRON, RETICCTPCT in the last 72 hours. Sepsis Labs: No results for input(s): PROCALCITON, LATICACIDVEN in the last 168 hours.  Recent Results (from the past 240 hour(s))  MRSA PCR Screening     Status: None   Collection Time: 03/11/18  6:21 PM  Result Value Ref  Range Status   MRSA by PCR NEGATIVE NEGATIVE Final    Comment:        The GeneXpert MRSA Assay (FDA approved for NASAL specimens only), is one component of a comprehensive MRSA colonization surveillance program. It is not intended to diagnose MRSA infection nor to guide or monitor treatment for MRSA infections. Performed at [facility], [address], [place], [place] [address]          Radiology Studies: Dg Chest Port 1 View  Result Date: 03/15/2018 CLINICAL DATA:  Fever. EXAM: PORTABLE CHEST 1 VIEW COMPARISON:  Chest x-ray dated March 11, 2018. FINDINGS: The patient is rotated to the right. Stable mild cardiomegaly. Normal pulmonary vascularity. No focal consolidation, pleural effusion, or pneumothorax. No acute osseous abnormality. IMPRESSION: No active disease. Electronically Signed   By: Obie Dredge M.D.   On: 03/15/2018 15:54  Scheduled Meds: . amLODipine  10 mg Oral Daily  . doxazosin  4 mg Oral QPM  . ferrous sulfate  325 mg Oral Daily  . furosemide  80 mg Oral Daily  . heparin injection (subcutaneous)  5,000 Units Subcutaneous Q8H  . hydrALAZINE  100 mg Oral Q8H  . insulin pump   Subcutaneous TID AC & HS  . isosorbide mononitrate  60 mg Oral BID  . metoprolol tartrate  50 mg Oral BID  . mycophenolate  360 mg Oral Daily   And  . mycophenolate  180 mg Oral QPM  . polyethylene glycol  34 g Oral Once  . pravastatin  40 mg Oral QHS  . predniSONE  5 mg Oral QAC breakfast  . sulfamethoxazole-trimethoprim  1 tablet Oral Q M,W,F  . tacrolimus  2 mg Oral BID   Continuous Infusions:    LOS: 5 days     Huey Bienenstockawood Elgergawy, MD [facility] Pager [phone]  If 7PM-7AM, please contact night-coverage [email or web address] Password [email or web address] 03/16/2018, 8:39 AM

## 2018-03-16 NOTE — Plan of Care (Signed)
  Problem: Education: Goal: Knowledge of General Education information will improve Outcome: Progressing   Problem: Clinical Measurements: Goal: Ability to maintain clinical measurements within normal limits will improve Outcome: Progressing   Problem: Clinical Measurements: Goal: Will remain free from infection Outcome: Progressing   Problem: Activity: Goal: Risk for activity intolerance will decrease Outcome: Progressing   Problem: Pain Managment: Goal: General experience of comfort will improve Outcome: Progressing   

## 2018-03-16 NOTE — Progress Notes (Signed)
Inpatient Rehabilitation Admissions Coordinator  I met with patient at bedside to discuss goals an expectations of an inpt rehab admit. She prefers inpt rehab admit if possible. Noted febrile. I await medical readiness to admit and bed availability. I will follow.  Danne Baxter, RN, MSN Rehab Admissions Coordinator [phone] 03/16/2018 11:35 AM

## 2018-03-16 NOTE — Progress Notes (Signed)
[facility] Progress Note  Subjective : remains febrile no chills, +night sweats, seen by ID  Vitals:   03/15/18 2122 03/16/18 0526 03/16/18 1135 03/16/18 1154  BP: (!) 174/71 (!) 188/63  (!) 156/87  Pulse: 84 77  65  Resp: 18 17  18   Temp: 98.3 F (36.8 C) 100 F (37.8 C)  98.2 F (36.8 C)  TempSrc: Oral Oral  Oral  SpO2: 96% 95%  95%  Weight:   94.1 kg (207 lb 7.3 oz)   Height:        Inpatient medications: . amLODipine  10 mg Oral Daily  . doxazosin  4 mg Oral QPM  . ferrous sulfate  325 mg Oral Daily  . furosemide  60 mg Intravenous Q12H  . heparin injection (subcutaneous)  5,000 Units Subcutaneous Q8H  . hydrALAZINE  100 mg Oral Q8H  . insulin pump   Subcutaneous TID AC & HS  . isosorbide mononitrate  60 mg Oral BID  . metoprolol tartrate  50 mg Oral BID  . mycophenolate  360 mg Oral Daily   And  . mycophenolate  180 mg Oral QPM  . polyethylene glycol  34 g Oral Once  . pravastatin  40 mg Oral QHS  . predniSONE  5 mg Oral QAC breakfast  . sulfamethoxazole-trimethoprim  1 tablet Oral Q M,W,F  . tacrolimus  2 mg Oral BID    acetaminophen, hydrALAZINE, HYDROcodone-acetaminophen, ondansetron (ZOFRAN) IV, oxyCODONE, polyethylene glycol  Exam: Gen alert, no distress No rash, cyanosis or gangrene Sclera anicteric, throat clear  No jvd or bruits Chest clear bilat to bases RRR no MRG Abd soft ntnd no mass or ascites +bs  GU defer MS no joint effusions or deformity, L leg in large brace and wrapped underneath Ext no LE or UE edema, no wounds or ulcers Neuro is alert, Ox 3 , nf LUA AVF +bruit.     Home meds:  - norvasc 5/ cardura 4 hs/ lasix 80 qd/ hydralazine 100 tid/ metoprolol 50 bid  - myfortic 360 am + 180 pm/ prograf 2 mg bid/ pred 5 mg qd  - statin/ bactrim MWF/ imdur 60/ ecasa/ feso4  - insulin pump    Impression: 1  Renal transplant '17 -  on appropriate meds and creat is at baseline in 1.6- 1.9 range here. Stable creatinine.  2   Hypertension - bp's up despite home meds x 4 and po lasix. Vol up here, will give IV lasix for 24- 48 hrs get vol down 3 Tibial shaft fracture - after fall, sp ORIF on 6/7.   4  DM2 - per primary 5  Fever low grade - ua and cxr negative, see ID rec's   Plan - as above   Vinson Moselle MD [facility] pager [phone]   03/16/2018, 12:57 PM   Recent Labs  Lab 03/14/18 0403 03/15/18 0343 03/16/18 0435  NA 136 138 136  K 3.8 3.6 3.4*  CL 101 103 101  CO2 24 25 24   GLUCOSE 170* 181* 128*  BUN 29* 33* 30*  CREATININE 1.89* 1.81* 1.71*  CALCIUM 8.4* 8.2* 8.0*   No results for input(s): AST, ALT, ALKPHOS, BILITOT, PROT, ALBUMIN in the last 168 hours. Recent Labs  Lab 03/11/18 1103  03/14/18 0403 03/15/18 0343 03/16/18 0435  WBC 7.2   < > 7.6 7.2 9.3  NEUTROABS 5.5  --   --   --   --   HGB 14.0   < > 11.4*  10.6* 11.1*  HCT 43.9   < > 36.7 33.3* 34.2*  MCV 96.7   < > 99.2 96.2 95.8  PLT 182   < > 149* 159 172   < > = values in this interval not displayed.   Iron/TIBC/Ferritin/ %Sat No results found for: IRON, TIBC, FERRITIN, IRONPCTSAT

## 2018-03-16 NOTE — Progress Notes (Addendum)
Physical Therapy Treatment Patient Details Name: Lorane GellDonna Grunow MRN: [id number] DOB: 12/27/1955 Today's Date: 03/16/2018    History of Present Illness  62 y.o. female with medical history significant for HTN, CKD stage III with LUE fistula (pt reports cannot lift more than 5 lbs with left due to fistula is tenuous), the patient has a history of kidney transplant in 2017, hyperlipidemia, diabetes on insulin pump, who was brought by the EMS, says she suffered a mechanical fall.  In review, the patient tripped over her dog,and sustained left proximal tibia/fibula fracture; s/p ORIF 03/12/18    PT Comments    Pt performed supine LE exercises to encourage knee flexion.  Pt is presenting with tight heel cords and lacks active dorsiflexion at this time on LLE.  Pt performed supine exercises and will progress to OOB mobility next session.  She has actively performed OOB transfers with OT for the past two days.  Pt remains motivated to progress and remains an excellent candidate for CIR therapies at this time.     Follow Up Recommendations  CIR     Equipment Recommendations  Wheelchair (measurements PT);Hospital bed(slide board drop arm bedside commode.  )    Recommendations for Other Services Rehab consult     Precautions / Restrictions Precautions Precautions: Fall;Other (comment) Precaution Comments: pt reports renal instructed her no more than 5 lbs lifting with LUE to preserve fistula in case dialysis needed Required Braces or Orthoses: Other Brace/Splint Other Brace/Splint: hinged brace LLE (locked at 0), ortho PA reports brace can be unlocked when up and locked at night for sleeping.   Restrictions Weight Bearing Restrictions: Yes LLE Weight Bearing: Touchdown weight bearing Other Position/Activity Restrictions: see precautions re: LUE    Mobility  Bed Mobility Overal bed mobility: Needs Assistance Bed Mobility: Rolling;Supine to Sit Rolling: Min guard(towards L side, cueing for  technique)   Supine to sit: Min assist;HOB elevated     General bed mobility comments: Min A to manage LLE   Transfers Overall transfer level: Needs assistance Equipment used: None Transfers: Lateral/Scoot Transfers          Lateral/Scoot Transfers: Min assist;+2 safety/equipment General transfer comment: +2 for safety, nursing managing L LE for pain mgmt and safety, min to min guard assist for lateral transfer onto drop arm BSC towards L side   Ambulation/Gait                 Stairs             Wheelchair Mobility    Modified Rankin (Stroke Patients Only)       Balance Overall balance assessment: Needs assistance Sitting-balance support: Feet unsupported;No upper extremity supported   Sitting balance - Comments: Able to maintain static sitting. Requiring elevation of LLE for pain management                                     Cognition Arousal/Alertness: Awake/alert Behavior During Therapy: WFL for tasks assessed/performed Overall Cognitive Status: Within Functional Limits for tasks assessed                                 General Comments: highly motivated to participate in therapy      Exercises Total Joint Exercises Ankle Circles/Pumps: PROM;Left;10 reps;Supine Quad Sets: AROM;Left;10 reps;Supine Short Arc Quad: AAROM;Left;10 reps;Supine Heel Slides: PROM;Left;10 reps;Supine Hip ABduction/ADduction:  AAROM;Left;10 reps;Supine    General Comments General comments (skin integrity, edema, etc.): husband present throughout session       Pertinent Vitals/Pain Pain Assessment: 0-10 Pain Score: 6  Faces Pain Scale: Hurts little more Pain Location: LLE Pain Descriptors / Indicators: Guarding;Operative site guarding Pain Intervention(s): Monitored during session;Repositioned;Ice applied    Home Living                      Prior Function            PT Goals (current goals can now be found in the care  plan section) Acute Rehab PT Goals Patient Stated Goal: be able to move well enough to return home to stay in downstairs/garage level of home Potential to Achieve Goals: Good Progress towards PT goals: Progressing toward goals    Frequency    Min 3X/week      PT Plan Current plan remains appropriate    Co-evaluation              AM-PAC PT "6 Clicks" Daily Activity  Outcome Measure  Difficulty turning over in bed (including adjusting bedclothes, sheets and blankets)?: A Lot Difficulty moving from lying on back to sitting on the side of the bed? : A Lot Difficulty sitting down on and standing up from a chair with arms (e.g., wheelchair, bedside commode, etc,.)?: Unable Help needed moving to and from a bed to chair (including a wheelchair)?: Total Help needed walking in hospital room?: Total Help needed climbing 3-5 steps with a railing? : Total 6 Click Score: 8    End of Session Equipment Utilized During Treatment: Gait belt Activity Tolerance: Patient tolerated treatment well Patient left: in bed;with call bell/phone within reach;with nursing/sitter in room;with family/visitor present Nurse Communication: Mobility status PT Visit Diagnosis: History of falling (Z91.81);Difficulty in walking, not elsewhere classified (R26.2);Pain Pain - Right/Left: Left Pain - part of body: Leg     Time: 1610-9604 PT Time Calculation (min) (ACUTE ONLY): 23 min  Charges:  $Therapeutic Exercise: 23-37 mins                    G Codes:       [name], PTA pager [phone]    Florestine Avers 03/16/2018, 6:33 PM

## 2018-03-17 ENCOUNTER — Other Ambulatory Visit (HOSPITAL_COMMUNITY): Payer: Medicare Other

## 2018-03-17 DIAGNOSIS — R5383 Other fatigue: Secondary | ICD-10-CM

## 2018-03-17 DIAGNOSIS — R011 Cardiac murmur, unspecified: Secondary | ICD-10-CM

## 2018-03-17 DIAGNOSIS — E1022 Type 1 diabetes mellitus with diabetic chronic kidney disease: Secondary | ICD-10-CM

## 2018-03-17 DIAGNOSIS — S82102S Unspecified fracture of upper end of left tibia, sequela: Secondary | ICD-10-CM

## 2018-03-17 DIAGNOSIS — R509 Fever, unspecified: Secondary | ICD-10-CM

## 2018-03-17 LAB — BASIC METABOLIC PANEL
ANION GAP: 20 — AB (ref 5–15)
ANION GAP: 21 — AB (ref 5–15)
Anion gap: 14 (ref 5–15)
BUN: 50 mg/dL — ABNORMAL HIGH (ref 6–20)
BUN: 60 mg/dL — ABNORMAL HIGH (ref 6–20)
BUN: 65 mg/dL — ABNORMAL HIGH (ref 6–20)
CALCIUM: 8.1 mg/dL — AB (ref 8.9–10.3)
CALCIUM: 8.3 mg/dL — AB (ref 8.9–10.3)
CHLORIDE: 96 mmol/L — AB (ref 101–111)
CO2: 18 mmol/L — AB (ref 22–32)
CO2: 16 mmol/L — ABNORMAL LOW (ref 22–32)
CO2: 23 mmol/L (ref 22–32)
CREATININE: 2.65 mg/dL — AB (ref 0.44–1.00)
Calcium: 8.2 mg/dL — ABNORMAL LOW (ref 8.9–10.3)
Chloride: 95 mmol/L — ABNORMAL LOW (ref 101–111)
Chloride: 95 mmol/L — ABNORMAL LOW (ref 101–111)
Creatinine, Ser: 2.28 mg/dL — ABNORMAL HIGH (ref 0.44–1.00)
Creatinine, Ser: 2.41 mg/dL — ABNORMAL HIGH (ref 0.44–1.00)
GFR calc non Af Amer: 22 mL/min — ABNORMAL LOW (ref >60)
GFR calc non Af Amer: 20 mL/min — ABNORMAL LOW (ref >60)
GFR calc non Af Amer: 18 mL/min — ABNORMAL LOW (ref >60)
GFR, EST AFRICAN AMERICAN: 25 mL/min — AB (ref >60)
GFR, EST AFRICAN AMERICAN: 24 mL/min — AB (ref >60)
GFR, EST AFRICAN AMERICAN: 21 mL/min — AB (ref >60)
GLUCOSE: 417 mg/dL — AB (ref 65–99)
Glucose, Bld: 372 mg/dL — ABNORMAL HIGH (ref 65–99)
Glucose, Bld: 246 mg/dL — ABNORMAL HIGH (ref 65–99)
POTASSIUM: 3.3 mmol/L — AB (ref 3.5–5.1)
Potassium: 3.1 mmol/L — ABNORMAL LOW (ref 3.5–5.1)
Potassium: 2.9 mmol/L — ABNORMAL LOW (ref 3.5–5.1)
SODIUM: 132 mmol/L — AB (ref 135–145)
SODIUM: 132 mmol/L — AB (ref 135–145)
Sodium: 134 mmol/L — ABNORMAL LOW (ref 135–145)

## 2018-03-17 LAB — GLUCOSE, CAPILLARY
GLUCOSE-CAPILLARY: 351 mg/dL — AB (ref 65–99)
Glucose-Capillary: 200 mg/dL — ABNORMAL HIGH (ref 65–99)
Glucose-Capillary: 339 mg/dL — ABNORMAL HIGH (ref 65–99)
Glucose-Capillary: 368 mg/dL — ABNORMAL HIGH (ref 65–99)
Glucose-Capillary: 385 mg/dL — ABNORMAL HIGH (ref 65–99)
Glucose-Capillary: 397 mg/dL — ABNORMAL HIGH (ref 65–99)

## 2018-03-17 LAB — CBC
HEMATOCRIT: 33.9 % — AB (ref 36.0–46.0)
HEMOGLOBIN: 10.9 g/dL — AB (ref 12.0–15.0)
MCH: 31 pg (ref 26.0–34.0)
MCHC: 32.2 g/dL (ref 30.0–36.0)
MCV: 96.3 fL (ref 78.0–100.0)
Platelets: 179 10*3/uL (ref 150–400)
RBC: 3.52 MIL/uL — ABNORMAL LOW (ref 3.87–5.11)
RDW: 14.5 % (ref 11.5–15.5)
WBC: 9.1 10*3/uL (ref 4.0–10.5)

## 2018-03-17 LAB — MAGNESIUM: Magnesium: 1.9 mg/dL (ref 1.7–2.4)

## 2018-03-17 LAB — BETA-HYDROXYBUTYRIC ACID: Beta-Hydroxybutyric Acid: 0.92 mmol/L — ABNORMAL HIGH (ref 0.05–0.27)

## 2018-03-17 MED ORDER — SODIUM CHLORIDE 0.9 % IV SOLN
INTRAVENOUS | Status: DC
  Administered 2018-03-18: 02:00:00 via INTRAVENOUS

## 2018-03-17 MED ORDER — SODIUM CHLORIDE 0.9 % IV SOLN
INTRAVENOUS | Status: DC
  Administered 2018-03-17: 20:00:00 via INTRAVENOUS

## 2018-03-17 MED ORDER — DEXTROSE-NACL 5-0.45 % IV SOLN
INTRAVENOUS | Status: DC
  Administered 2018-03-18 – 2018-03-19 (×4): via INTRAVENOUS

## 2018-03-17 MED ORDER — SODIUM CHLORIDE 0.9 % IV SOLN
INTRAVENOUS | Status: DC
  Administered 2018-03-18: 0.7 [IU]/h via INTRAVENOUS
  Administered 2018-03-18: 1.3 [IU]/h via INTRAVENOUS
  Filled 2018-03-17: qty 1

## 2018-03-17 MED ORDER — ACETAMINOPHEN 325 MG PO TABS
650.0000 mg | ORAL_TABLET | ORAL | Status: DC | PRN
  Administered 2018-03-17 – 2018-03-21 (×3): 650 mg via ORAL
  Filled 2018-03-17 (×3): qty 2

## 2018-03-17 MED ORDER — POTASSIUM CHLORIDE CRYS ER 20 MEQ PO TBCR
20.0000 meq | EXTENDED_RELEASE_TABLET | Freq: Once | ORAL | Status: AC
Start: 2018-03-17 — End: 2018-03-17
  Administered 2018-03-17: 20 meq via ORAL
  Filled 2018-03-17: qty 1

## 2018-03-17 NOTE — Progress Notes (Signed)
Inpatient Diabetes Program Recommendations  AACE/ADA: New Consensus Statement on Inpatient Glycemic Control (2015)  Target Ranges:  Prepandial:   less than 140 mg/dL      Peak postprandial:   less than 180 mg/dL (1-2 hours)      Critically ill patients:  140 - 180 mg/dL   Lab Results  Component Value Date   GLUCAP 397 (H) 03/17/2018   HGBA1C 5.8 (H) 03/11/2018    Review of Glycemic Control  Blood sugars increased beginning last night at 2100. Pt changed site around noon yesterday. Blood sugars in 300s today. Pt has bolused insulin and blood sugars continue to rise. Pt states there was no kink in tubing. 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 Changed site again this afternoon.  CBGs - 339-417 mg/dL. ? DKA. CO2 - 16   AG - 21  K+ 3.1  Inpatient Diabetes Program Recommendations:     May need IV insulin and pump will need to be removed. Discussed with MD.  Continue to follow.  Thank you. Ailene Ardshonda Sianna Garofano, RD, LDN, CDE Inpatient Diabetes Coordinator [phone]

## 2018-03-17 NOTE — Progress Notes (Signed)
[facility] Progress Note  Subjective : continues to have fevers.  3.3 L uop on IV lasix and creat bumped to 2.3. Feels "lousy".    Vitals:   03/16/18 2123 03/17/18 0425 03/17/18 0758 03/17/18 1047  BP:  (!) 149/61 (!) 152/56   Pulse:  85 80   Resp:  16 16   Temp: 100.2 F (37.9 C) 98.7 F (37.1 C) 98.4 F (36.9 C)   TempSrc: Oral Oral Oral   SpO2:  96% 95%   Weight:  93.1 kg (205 lb 4 oz)  91.2 kg (201 lb 1 oz)  Height:        Inpatient medications: . amLODipine  10 mg Oral Daily  . doxazosin  4 mg Oral QPM  . ferrous sulfate  325 mg Oral Daily  . heparin injection (subcutaneous)  5,000 Units Subcutaneous Q8H  . hydrALAZINE  100 mg Oral Q8H  . insulin pump   Subcutaneous TID AC & HS  . isosorbide mononitrate  60 mg Oral BID  . metoprolol tartrate  50 mg Oral BID  . mycophenolate  360 mg Oral Daily   And  . mycophenolate  180 mg Oral QPM  . polyethylene glycol  34 g Oral Once  . pravastatin  40 mg Oral QHS  . predniSONE  5 mg Oral QAC breakfast  . sulfamethoxazole-trimethoprim  1 tablet Oral Q M,W,F  . tacrolimus  2 mg Oral BID    acetaminophen, bisacodyl, hydrALAZINE, ondansetron (ZOFRAN) IV, oxyCODONE, polyethylene glycol  Exam: Gen alert, no distress No rash, cyanosis or gangrene Sclera anicteric, throat clear  No jvd or bruits Chest clear bilat to bases RRR no MRG Abd soft ntnd no mass or ascites +bs  GU defer MS no joint effusions or deformity, L leg in large brace and wrapped underneath Ext no LE or UE edema, no wounds or ulcers Neuro is alert, Ox 3 , nf LUA AVF +bruit.     Home meds:  - norvasc 5/ cardura 4 hs/ lasix 80 qd/ hydralazine 100 tid/ metoprolol 50 bid  - myfortic 360 am + 180 pm/ prograf 2 mg bid/ pred 5 mg qd  - statin/ bactrim MWF/ imdur 60/ ecasa/ feso4  - insulin pump    Impression: 1  Renal transplant '17 -  on appropriate meds, baseline in 1.6- 1.9 range here. Creat up today after IV lasix, will dc IV and po  lasix for now due to overdiuresis.  2  Hypertension - BP's good on home meds x 4 3  Fevers - w/u in progress per ID 4  Tibial shaft fracture - after fall, sp ORIF on 6/7.   5  DM2 - per primary   Plan - as above   Hannah Moselleob Sicilia Killough MD [facility] pager [phone]   03/17/2018, 2:40 PM   Recent Labs  Lab 03/15/18 0343 03/16/18 0435 03/17/18 0712  NA 138 136 134*  K 3.6 3.4* 3.3*  CL 103 101 96*  CO2 25 24 18*  GLUCOSE 181* 128* 372*  BUN 33* 30* 50*  CREATININE 1.81* 1.71* 2.28*  CALCIUM 8.2* 8.0* 8.2*   No results for input(s): AST, ALT, ALKPHOS, BILITOT, PROT, ALBUMIN in the last 168 hours. Recent Labs  Lab 03/11/18 1103  03/15/18 0343 03/16/18 0435 03/17/18 0712  WBC 7.2   < > 7.2 9.3 9.1  NEUTROABS 5.5  --   --   --   --   HGB 14.0   < > 10.6* 11.1* 10.9*  HCT 43.9   < > 33.3* 34.2* 33.9*  MCV 96.7   < > 96.2 95.8 96.3  PLT 182   < > 159 172 179   < > = values in this interval not displayed.   Iron/TIBC/Ferritin/ %Sat No results found for: IRON, TIBC, FERRITIN, IRONPCTSAT

## 2018-03-17 NOTE — Progress Notes (Signed)
PROGRESS NOTE    Hannah Hannah  FKC:[id number] DOB: Dec 03, 1955 DOA: 03/11/2018 PCP: Manfred Shirts, PA   Brief Narrative:  Hannah Hannah,CKD stage III, the Hannah has a Hannah Hannah in 2017, hyperlipidemia, diabetes on insulin pump, presents with mechanical fall, sustained a tibial fracture, status post surgical repair by Dr. Doreatha Martin , Hannah continues to have intermittent fever infectious work-up remains negative. ID consulted.  Pancultured.  CMV EBV titers pending.     Assessment & Plan:   Principal Problem:   Hannah tibial fracture Active Problems:   Fall   Hannah Hannah   Essential hypertension   HLD (hyperlipidemia)   CKD (chronic kidney disease), stage III (HCC)   DM (diabetes mellitus), type 1 (HCC)   Closed fracture of upper end of Hannah fibula   Trauma   FUO (fever of unknown origin)   Hypertensive crisis   Prediabetes   Acute blood loss anemia   Fever  Hannah tibial fracture;  -Secondary to mechanical fall, S/P ORIF of Hannah tibial shaft fracture.  - Management per ORTHO.  -Followed by PT, CIR consulted and following..  DM type 1;??Early DKA -Hannah is on insulin pump, Hannah is quite comfortable with using the pump, DM coordinator helping with the pump, Hannah reports last A1c was 6.4. CBGs noted to be elevated in the 300s today.  We will repeat a stat be met due to concerns for possible early DKA.  Infectious work-up underway.  Check a UA with cultures and sensitivities.  Check a beta hydroxybutyrate.  If beta hydroxybutyrate is positive will need to discontinue insulin pump and placed on the glucose stabilizer with gentle hydration and follow.  CKD stage III - status post renal Hannah 2017, on oral immunosuppressant therapy. Baseline getting around 1.7-1.9. Renal function worsening and currently at 2.28.  Lasix has been discontinued per nephrology.  Placed on gentle  hydration with IV fluids and follow.  Nephrology following.  Continue current immunosuppressive treatments as well as Bactrim for prophylaxis.??  Discontinue Bactrim will defer to nephrology.  Continue Prograf.  Avoid nephrotoxins.  Fever -Hannah continues to have intermittent fever,  unremarkable.  Chest x-ray negative.  Blood cultures pending.  CMV EBV titers obtained per ID.  Hannah on Bactrim for prophylaxis.  Hannah noted to be refusing DVT prophylaxis with subcu heparin.  Lower extremity Dopplers were done were negative for DVT.  Hannah has been reassessed by orthopedics who does not feel infection is coming from recent surgery.  ID following and appreciate input and recommendations.     Valentine;  -Blood pressure better controlled with increase of Norvasc from 5 to 10 mg daily.  Continue Cardura, hydralazine, Imdur, metoprolol.  Hydralazine as needed.        DVT prophylaxis: Heparin Code Status: Full Family Communication: Updated Hannah and husband at bedside. Disposition Plan: CIR when medically stable.   Consultants:   Orthopedics: Dr. Doreatha Martin 03/12/2018  Nephrology: Dr. Henrietta Dine 03/14/2018  CIR: Dr Posey Pronto 03/15/2018  Infectious disease: Dr. Linus Salmons 03/16/2018  Procedures:   CT Hannah knee 03/11/2018  Chest x-ray 03/15/2018   plain films of the Hannah tib-fib 03/12/2018, 03/11/2018  Lower extremity Dopplers 03/16/2018  ORIF Hannah proximal tibial fracture per Dr. Doreatha Martin 03/12/2018  Antimicrobials:   Prophylactic Bactrim 03/12/2018   Subjective: Hannah Hannah feeling tired and fatigue does not feel too well.  Hannah noted to have sweats overnight again.  Denies any chest pain no shortness of  breath.  Noted to have CBGs in the 300s.  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  Hannah noted to have a fever overnight with a T-max of 100.2.  Objective: Vitals:   03/17/18 0758 03/17/18 1047 03/17/18 1826 03/17/18 2029  BP: (!) 152/56  (!) 129/46 (!)  141/46  Pulse: 80   62  Resp: 16     Temp: 98.4 F (36.9 C)   98.5 F (36.9 C)  TempSrc: Oral   Oral  SpO2: 95%   96%  Weight:  91.2 kg (201 lb 1 oz)    Height:       No intake or output data in the 24 hours ending 03/17/18 2136 Filed Weights   03/16/18 1135 03/17/18 0425 03/17/18 1047  Weight: 94.1 kg (207 lb 7.3 oz) 93.1 kg (205 lb 4 oz) 91.2 kg (201 lb 1 oz)    Examination:  General exam: Appears calm and comfortable  Respiratory system: Clear to auscultation. Respiratory effort normal. Cardiovascular system: S1 & S2 heard, RRR. No JVD, murmurs, rubs, gallops or clicks. No pedal edema. Gastrointestinal system: Abdomen is nondistended, soft and nontender. No organomegaly or masses felt. Normal bowel sounds heard. Central nervous system: Alert and oriented. No focal neurological deficits. Extremities: Hannah lower extremity with some ecchymosis and some erythema, hinged brace.  Symmetric 5 x 5 power. Skin: No rashes, lesions or ulcers Psychiatry: Judgement and insight appear normal. Mood & affect appropriate.     Data Reviewed: I have personally reviewed following labs and imaging studies  CBC: Recent Labs  Lab 03/11/18 1103 03/13/18 0331 03/14/18 0403 03/15/18 0343 03/16/18 0435 03/17/18 0712  WBC 7.2 8.7 7.6 7.2 9.3 9.1  NEUTROABS 5.5  --   --   --   --   --   HGB 14.0 11.6* 11.4* 10.6* 11.1* 10.9*  HCT 43.9 37.3 36.7 33.3* 34.2* 33.9*  MCV 96.7 99.2 99.2 96.2 95.8 96.3  PLT 182 152 149* 159 172 982   Basic Metabolic Panel: Recent Labs  Lab 03/14/18 0403 03/15/18 0343 03/16/18 0435 03/17/18 0712 03/17/18 1613  NA 136 138 136 134* 132*  K 3.8 3.6 3.4* 3.3* 3.1*  CL 101 103 101 96* 95*  CO2 24 25 24  18* 16*  GLUCOSE 170* 181* 128* 372* 417*  BUN 29* 33* 30* 50* 60*  CREATININE 1.89* 1.81* 1.71* 2.28* 2.41*  CALCIUM 8.4* 8.2* 8.0* 8.2* 8.1*   GFR: Estimated Creatinine Clearance: 28 mL/min (A) (by C-G formula based on SCr of 2.41 mg/dL (H)). Liver  Function Tests: No results for input(s): AST, ALT, ALKPHOS, BILITOT, PROT, ALBUMIN in the last 168 hours. No results for input(s): LIPASE, AMYLASE in the last 168 hours. No results for input(s): AMMONIA in the last 168 hours. Coagulation Profile: Recent Labs  Lab 03/11/18 1103  INR 1.08   Cardiac Enzymes: Recent Labs  Lab 03/11/18 1550  TROPONINI <0.03   BNP (last 3 results) No results for input(s): PROBNP in the last 8760 hours. HbA1C: No results for input(s): HGBA1C in the last 72 hours. CBG: Recent Labs  Lab 03/17/18 0032 03/17/18 0433 03/17/18 0747 03/17/18 1233 03/17/18 1629  GLUCAP 339* 368* 351* 385* 397*   Lipid Profile: No results for input(s): CHOL, HDL, LDLCALC, TRIG, CHOLHDL, LDLDIRECT in the last 72 hours. Thyroid Function Tests: No results for input(s): TSH, T4TOTAL, FREET4, T3FREE, THYROIDAB in the last 72 hours. Anemia Panel: No results for input(s): VITAMINB12, FOLATE, FERRITIN,  TIBC, IRON, RETICCTPCT in the last 72 hours. Sepsis Labs: No results for input(s): PROCALCITON, LATICACIDVEN in the last 168 hours.  Recent Results (from the past 240 hour(s))  MRSA PCR Screening     Status: None   Collection Time: 03/11/18  6:21 PM  Result Value Ref Range Status   MRSA by PCR NEGATIVE NEGATIVE Final    Comment:        The GeneXpert MRSA Assay (FDA approved for NASAL specimens only), is one component of a comprehensive MRSA colonization surveillance program. It is not intended to diagnose MRSA infection nor to guide or monitor treatment for MRSA infections. Performed at [facility], [place] [address] [place], [place] [address]   Culture, blood (routine x 2)     Status: None (Preliminary result)   Collection Time: 03/14/18  9:10 PM  Result Value Ref Range Status   Specimen Description BLOOD RIGHT ARM  Final   Special Requests   Final    BOTTLES DRAWN AEROBIC ONLY Blood Culture adequate volume   Culture   Final    NO GROWTH 2 DAYS Performed  at [facility], [place] [address] [place], [place] [address]    Report Status PENDING  Incomplete  Culture, blood (routine x 2)     Status: None (Preliminary result)   Collection Time: 03/14/18  9:15 PM  Result Value Ref Range Status   Specimen Description BLOOD RIGHT ARM  Final   Special Requests   Final    BOTTLES DRAWN AEROBIC ONLY Blood Culture adequate volume   Culture   Final    NO GROWTH 2 DAYS Performed at [facility], [address], [place], [place] [address]    Report Status PENDING  Incomplete         Radiology Studies: No results found.      Scheduled Meds: . amLODipine  10 mg Oral Daily  . doxazosin  4 mg Oral QPM  . ferrous sulfate  325 mg Oral Daily  . heparin injection (subcutaneous)  5,000 Units Subcutaneous Q8H  . hydrALAZINE  100 mg Oral Q8H  . insulin pump   Subcutaneous TID AC & HS  . isosorbide mononitrate  60 mg Oral BID  . metoprolol tartrate  50 mg Oral BID  . mycophenolate  360 mg Oral Daily   And  . mycophenolate  180 mg Oral QPM  . polyethylene glycol  34 g Oral Once  . pravastatin  40 mg Oral QHS  . predniSONE  5 mg Oral QAC breakfast  . sulfamethoxazole-trimethoprim  1 tablet Oral Q M,W,F  . tacrolimus  2 mg Oral BID   Continuous Infusions: . sodium chloride 75 mL/hr at 03/17/18 2001     LOS: 6 days    Time spent: 40 minutes    Irine Seal, MD [facility] Pager [phone]  If 7PM-7AM, please contact night-coverage [email or web address] Password [facility] 03/17/2018, 9:36 PM

## 2018-03-17 NOTE — Progress Notes (Signed)
Inpatient Rehabilitation Admissions Coordinator  I continue to follow to await medical clearance to admit to CIR.  Ottie GlazierBarbara Semaj Kham, RN, MSN Rehab Admissions Coordinator [phone] 03/17/2018 3:17 PM

## 2018-03-17 NOTE — Progress Notes (Signed)
[facility]   Reason for visit: Follow up on fever  Interval History: sweats overnight again, feels more tired/fatigued.  No new symptoms including no GI upset, diarrhea, rashes.    Physical Exam: Constitutional:  Vitals:   03/17/18 0425 03/17/18 0758  BP: (!) 149/61 (!) 152/56  Pulse: 85 80  Resp: 16 16  Temp: 98.7 F (37.1 C) 98.4 F (36.9 C)  SpO2: 96% 95%   patient appears in NAD, tired appearing Eyes: anicteric HENT: no thrush Respiratory: Normal respiratory effort; CTA B Cardiovascular: Tachy RR with 3/6 SEM GI: soft, nt, nd MS: left leg in brace, right leg without edema  Review of Systems: Constitutional: positive for fevers and fatigue or negative for weight loss Respiratory: negative for cough or sputum Gastrointestinal: negative for nausea and diarrhea Genitourinary: negative for dysuria Integument/breast: negative for rash Musculoskeletal: negative for myalgias  Lab Results  Component Value Date   WBC 9.1 03/17/2018   HGB 10.9 (L) 03/17/2018   HCT 33.9 (L) 03/17/2018   MCV 96.3 03/17/2018   PLT 179 03/17/2018    Lab Results  Component Value Date   CREATININE 2.28 (H) 03/17/2018   BUN 50 (H) 03/17/2018   NA 134 (L) 03/17/2018   K 3.3 (L) 03/17/2018   CL 96 (L) 03/17/2018   CO2 18 (L) 03/17/2018    Lab Results  Component Value Date   ALT 20 10/03/2017   AST 33 10/03/2017   ALKPHOS 62 10/03/2017     Microbiology: Recent Results (from the past 240 hour(s))  MRSA PCR Screening     Status: None   Collection Time: 03/11/18  6:21 PM  Result Value Ref Range Status   MRSA by PCR NEGATIVE NEGATIVE Final    Comment:        The GeneXpert MRSA Assay (FDA approved for NASAL specimens only), is one component of a comprehensive MRSA colonization surveillance program. It is not intended to diagnose MRSA infection nor to guide or monitor treatment for MRSA infections. Performed at [facility], [address] [place], [place] [address]   Culture, blood (routine x 2)     Status: None (Preliminary result)   Collection Time: 03/14/18  9:10 PM  Result Value Ref Range Status   Specimen Description BLOOD RIGHT ARM  Final   Special Requests   Final    BOTTLES DRAWN AEROBIC ONLY Blood Culture adequate volume   Culture   Final    NO GROWTH 1 DAY Performed at [facility], [address] [place], [place] [address]    Report Status PENDING  Incomplete  Culture, blood (routine x 2)     Status: None (Preliminary result)   Collection Time: 03/14/18  9:15 PM  Result Value Ref Range Status   Specimen Description BLOOD RIGHT ARM  Final   Special Requests   Final    BOTTLES DRAWN AEROBIC ONLY Blood Culture adequate volume   Culture   Final    NO GROWTH 1 DAY Performed at [facility], [address] [place], [place] [address]    Report Status PENDING  Incomplete    Impression/Plan:  1. Fever - decreased fever curve.  No new concerns though looks more tired and ill appearing today.  Blood and urine sent off.   I will check an echo as well, ? Cardiomyopathy, endocarditis.  2.  DM - low blood sugars; may be contributing to above sweating, fatigue.  3.  Renal transplant - about 2 years out.  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    4.  Fatigue - OT has recommended CIR.

## 2018-03-17 NOTE — Progress Notes (Signed)
PT Cancellation Note  Patient Details Name: Lorane GellDonna Risby MRN: [id number] DOB: 07/16/1956   Cancelled Treatment:    Reason Eval/Treat Not Completed: (P) Medical issues which prohibited therapy(Pt with elevated Blood glucose levels and insulin pump malufunction? will defer PT tx at this time.  )   Florestine AversAimee J Najae Filsaime 03/17/2018, 4:16 PM

## 2018-03-18 ENCOUNTER — Inpatient Hospital Stay (HOSPITAL_COMMUNITY): Payer: Medicare Other

## 2018-03-18 DIAGNOSIS — I5030 Unspecified diastolic (congestive) heart failure: Secondary | ICD-10-CM

## 2018-03-18 DIAGNOSIS — R8271 Bacteriuria: Secondary | ICD-10-CM

## 2018-03-18 LAB — BASIC METABOLIC PANEL
ANION GAP: 13 (ref 5–15)
ANION GAP: 14 (ref 5–15)
Anion gap: 13 (ref 5–15)
Anion gap: 15 (ref 5–15)
Anion gap: 12 (ref 5–15)
BUN: 66 mg/dL — ABNORMAL HIGH (ref 6–20)
BUN: 65 mg/dL — AB (ref 6–20)
BUN: 66 mg/dL — ABNORMAL HIGH (ref 6–20)
BUN: 67 mg/dL — ABNORMAL HIGH (ref 6–20)
BUN: 65 mg/dL — ABNORMAL HIGH (ref 6–20)
CALCIUM: 8.2 mg/dL — AB (ref 8.9–10.3)
CHLORIDE: 99 mmol/L — AB (ref 101–111)
CHLORIDE: 98 mmol/L — AB (ref 101–111)
CHLORIDE: 96 mmol/L — AB (ref 101–111)
CO2: 23 mmol/L (ref 22–32)
CO2: 21 mmol/L — AB (ref 22–32)
CO2: 22 mmol/L (ref 22–32)
CO2: 22 mmol/L (ref 22–32)
CO2: 22 mmol/L (ref 22–32)
CREATININE: 2.61 mg/dL — AB (ref 0.44–1.00)
CREATININE: 2.46 mg/dL — AB (ref 0.44–1.00)
CREATININE: 2.32 mg/dL — AB (ref 0.44–1.00)
Calcium: 8.1 mg/dL — ABNORMAL LOW (ref 8.9–10.3)
Calcium: 8 mg/dL — ABNORMAL LOW (ref 8.9–10.3)
Calcium: 8.4 mg/dL — ABNORMAL LOW (ref 8.9–10.3)
Calcium: 8.4 mg/dL — ABNORMAL LOW (ref 8.9–10.3)
Chloride: 97 mmol/L — ABNORMAL LOW (ref 101–111)
Chloride: 97 mmol/L — ABNORMAL LOW (ref 101–111)
Creatinine, Ser: 2.58 mg/dL — ABNORMAL HIGH (ref 0.44–1.00)
Creatinine, Ser: 2.34 mg/dL — ABNORMAL HIGH (ref 0.44–1.00)
GFR calc non Af Amer: 19 mL/min — ABNORMAL LOW (ref >60)
GFR calc non Af Amer: 19 mL/min — ABNORMAL LOW (ref >60)
GFR calc non Af Amer: 20 mL/min — ABNORMAL LOW (ref >60)
GFR calc non Af Amer: 21 mL/min — ABNORMAL LOW (ref >60)
GFR calc non Af Amer: 21 mL/min — ABNORMAL LOW (ref >60)
GFR, EST AFRICAN AMERICAN: 22 mL/min — AB (ref >60)
GFR, EST AFRICAN AMERICAN: 21 mL/min — AB (ref >60)
GFR, EST AFRICAN AMERICAN: 23 mL/min — AB (ref >60)
GFR, EST AFRICAN AMERICAN: 25 mL/min — AB (ref >60)
GFR, EST AFRICAN AMERICAN: 25 mL/min — AB (ref >60)
GLUCOSE: 169 mg/dL — AB (ref 65–99)
GLUCOSE: 190 mg/dL — AB (ref 65–99)
Glucose, Bld: 116 mg/dL — ABNORMAL HIGH (ref 65–99)
Glucose, Bld: 154 mg/dL — ABNORMAL HIGH (ref 65–99)
Glucose, Bld: 157 mg/dL — ABNORMAL HIGH (ref 65–99)
Potassium: 3.3 mmol/L — ABNORMAL LOW (ref 3.5–5.1)
Potassium: 3.6 mmol/L (ref 3.5–5.1)
Potassium: 3.3 mmol/L — ABNORMAL LOW (ref 3.5–5.1)
Potassium: 3.6 mmol/L (ref 3.5–5.1)
Potassium: 3.8 mmol/L (ref 3.5–5.1)
SODIUM: 133 mmol/L — AB (ref 135–145)
SODIUM: 132 mmol/L — AB (ref 135–145)
Sodium: 133 mmol/L — ABNORMAL LOW (ref 135–145)
Sodium: 133 mmol/L — ABNORMAL LOW (ref 135–145)
Sodium: 133 mmol/L — ABNORMAL LOW (ref 135–145)

## 2018-03-18 LAB — GLUCOSE, CAPILLARY
GLUCOSE-CAPILLARY: 256 mg/dL — AB (ref 65–99)
GLUCOSE-CAPILLARY: 110 mg/dL — AB (ref 65–99)
GLUCOSE-CAPILLARY: 115 mg/dL — AB (ref 65–99)
GLUCOSE-CAPILLARY: 138 mg/dL — AB (ref 65–99)
GLUCOSE-CAPILLARY: 176 mg/dL — AB (ref 65–99)
GLUCOSE-CAPILLARY: 162 mg/dL — AB (ref 65–99)
GLUCOSE-CAPILLARY: 158 mg/dL — AB (ref 65–99)
GLUCOSE-CAPILLARY: 155 mg/dL — AB (ref 65–99)
Glucose-Capillary: 114 mg/dL — ABNORMAL HIGH (ref 65–99)
Glucose-Capillary: 131 mg/dL — ABNORMAL HIGH (ref 65–99)
Glucose-Capillary: 137 mg/dL — ABNORMAL HIGH (ref 65–99)
Glucose-Capillary: 188 mg/dL — ABNORMAL HIGH (ref 65–99)
Glucose-Capillary: 202 mg/dL — ABNORMAL HIGH (ref 65–99)
Glucose-Capillary: 223 mg/dL — ABNORMAL HIGH (ref 65–99)
Glucose-Capillary: 232 mg/dL — ABNORMAL HIGH (ref 65–99)

## 2018-03-18 LAB — CMV DNA, QUANTITATIVE, PCR
CMV DNA QUANT: POSITIVE [IU]/mL
Log10 CMV Qn DNA Pl: UNDETERMINED log10 IU/mL

## 2018-03-18 LAB — ECHOCARDIOGRAM COMPLETE
HEIGHTINCHES: 67 in
Weight: 3216.95 oz

## 2018-03-18 LAB — CMV QUANT DNA PCR (URINE)
CMV Qn DNA PCR (Urine): 1446 copies/mL
LOG10 CMV QN DNA UR: 3.16 {Log_copies}/mL

## 2018-03-18 LAB — EPSTEIN BARR VRS(EBV DNA BY PCR)
EBV DNA QN by PCR: NEGATIVE copies/mL
log10 EBV DNA Qn PCR: UNDETERMINED log10 copy/mL

## 2018-03-18 MED ORDER — VALGANCICLOVIR HCL 450 MG PO TABS
450.0000 mg | ORAL_TABLET | Freq: Every day | ORAL | Status: DC
  Administered 2018-03-18 – 2018-03-20 (×3): 450 mg via ORAL
  Filled 2018-03-18 (×4): qty 1

## 2018-03-18 MED ORDER — POTASSIUM CHLORIDE CRYS ER 20 MEQ PO TBCR
20.0000 meq | EXTENDED_RELEASE_TABLET | Freq: Once | ORAL | Status: AC
  Administered 2018-03-18: 20 meq via ORAL
  Filled 2018-03-18: qty 1

## 2018-03-18 MED ORDER — DEXTROSE-NACL 5-0.45 % IV SOLN: INTRAVENOUS | Status: DC

## 2018-03-18 MED ORDER — SODIUM CHLORIDE 0.9 % IV SOLN
2.5000 mg/kg | Freq: Every day | INTRAVENOUS | Status: DC
  Filled 2018-03-18: qty 4.6

## 2018-03-18 NOTE — Progress Notes (Signed)
Occupational Therapy Treatment Patient Details Name: Lurdes Haltiwanger MRN: [id number] DOB: [birth year] Today's Date: 03/18/2018    History of present illness  62 y.o. female with medical history significant for HTN, CKD stage III with LUE fistula (pt reports cannot lift more than 5 lbs with left due to fistula is tenuous), the patient has a history of kidney transplant in 2017, hyperlipidemia, diabetes on insulin pump, who was brought by the EMS, says she suffered a mechanical fall.  In review, the patient tripped over her dog,and sustained left proximal tibia/fibula fracture; s/p ORIF 03/12/18   OT comments  This 62 yo female admitted and underwent above presents to acute OT today very motivated for therapy. She is overall a min A for transfers (with additonal +1 for safety and increased time) with the A needed for LLE. She is unable to move her LLE by herself at this time (will try and introduce a leg lifter next session to see if this is feasible for her).    Follow Up Recommendations  CIR    Equipment Recommendations  3 in 1 bedside commode;Other (comment)(drop arm)       Precautions / Restrictions Precautions Precautions: Fall Precaution Comments: pt reports renal instructed her no more than 5 lbs lifting with LUE to preserve fistula in case dialysis needed Required Braces or Orthoses: Other Brace/Splint Other Brace/Splint: hinged brace LLE (locked at 0), ortho PA reports brace can be unlocked when up and locked at night for sleeping.   Restrictions Weight Bearing Restrictions: Yes LLE Weight Bearing: Touchdown weight bearing       Mobility Bed Mobility Overal bed mobility: Needs Assistance Bed Mobility: Supine to Sit     Supine to sit: Min assist;HOB elevated     General bed mobility comments: Min A to manage LLE   Transfers Overall transfer level: Needs assistance Equipment used: None Transfers: Lateral/Scoot Transfers          Lateral/Scoot Transfers: Min assist;+2  safety/equipment General transfer comment: +2 for safety, one person to mangage LLE and one for safety as pt scooted laterally from bed to recliner    Balance Overall balance assessment: Needs assistance Sitting-balance support: No upper extremity supported;Feet supported Sitting balance-Leahy Scale: Fair                                     ADL either performed or assessed with clinical judgement   ADL Overall ADL's : Needs assistance/impaired                         Toilet Transfer: Minimal assistance;+2 for safety/equipment Toilet Transfer Details (indicate cue type and reason): simulated with lateral scoot from bed to drop arm recliner going to pt's right. 1 to A with LLE and the other person for safety as pt scooted.                 Vision Baseline Vision/History: Wears glasses Wears Glasses: Reading only Patient Visual Report: No change from baseline            Cognition Arousal/Alertness: Awake/alert Behavior During Therapy: WFL for tasks assessed/performed Overall Cognitive Status: Within Functional Limits for tasks assessed  Pertinent Vitals/ Pain       Pain Assessment: 0-10 Pain Score: 6  Pain Location: LLE (intermittently with movement) Pain Descriptors / Indicators: Guarding;Operative site guarding Pain Intervention(s): Monitored during session;Repositioned         Frequency  Min 3X/week        Progress Toward Goals  OT Goals(current goals can now be found in the care plan section)  Progress towards OT goals: Progressing toward goals     Plan Discharge plan remains appropriate;Frequency remains appropriate       AM-PAC PT "6 Clicks" Daily Activity     Outcome Measure   Help from another person eating meals?: None Help from another person taking care of personal grooming?: A Little Help from another person toileting, which includes using  toliet, bedpan, or urinal?: A Lot Help from another person bathing (including washing, rinsing, drying)?: A Lot Help from another person to put on and taking off regular upper body clothing?: A Little Help from another person to put on and taking off regular lower body clothing?: A Lot 6 Click Score: 16    End of Session Equipment Utilized During Treatment: Other (comment)(hinged knee brace)  OT Visit Diagnosis: Other abnormalities of gait and mobility (R26.89);Muscle weakness (generalized) (M62.81);Pain Pain - Right/Left: Left Pain - part of body: Leg   Activity Tolerance Patient tolerated treatment well   Patient Left in chair;with call bell/phone within reach;with family/visitor present   Nurse Communication (RN in room during transfer)        Time: 1207-1230 OT Time Calculation (min): 23 min  Charges: OT General Charges $OT Visit: 1 Visit OT Treatments $Self Care/Home Management : 23-37 mins Ignacia PalmaCathy Yida Hyams, OTR/L [phone] 03/18/2018

## 2018-03-18 NOTE — Progress Notes (Signed)
    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  No associated rash.  Feels better than yesterday. Moved to stepdown unit overnight  No new positive cultures otherwise   Physical Exam: Constitutional:  Vitals:   03/18/18 0825 03/18/18 0951  BP: (!) 142/47 (!) 151/53  Pulse: 72 65  Resp: 20   Temp:  98.4 F (36.9 C)  SpO2: 93%    patient appears in NAD Eyes: anicteric Respiratory: Normal respiratory effort; CTA B Cardiovascular: RRR GI: soft, nt, nd  Review of Systems: Constitutional: negative for fevers and chills Gastrointestinal: negative for nausea and diarrhea Integument/breast: negative for rash  Lab Results  Component Value Date   WBC 9.1 03/17/2018   HGB 10.9 (L) 03/17/2018   HCT 33.9 (L) 03/17/2018   MCV 96.3 03/17/2018   PLT 179 03/17/2018    Lab Results  Component Value Date   CREATININE 2.61 (H) 03/18/2018   BUN 65 (H) 03/18/2018   NA 133 (L) 03/18/2018   K 3.6 03/18/2018   CL 97 (L) 03/18/2018   CO2 21 (L) 03/18/2018    Lab Results  Component Value Date   ALT 20 10/03/2017   AST 33 10/03/2017   ALKPHOS 62 10/03/2017     Microbiology: Recent Results (from the past 240 hour(s))  MRSA PCR Screening     Status: None   Collection Time: 03/11/18  6:21 PM  Result Value Ref Range Status   MRSA by PCR NEGATIVE NEGATIVE Final    Comment:        The GeneXpert MRSA Assay (FDA approved for NASAL specimens only), is one component of a comprehensive MRSA colonization surveillance program. It is not intended to diagnose MRSA infection nor to guide or monitor treatment for MRSA infections. Performed at [facility], [address] [place], [place] [address]   Culture, blood (routine x 2)     Status: None (Preliminary result)   Collection Time: 03/14/18  9:10 PM  Result Value Ref Range Status   Specimen Description BLOOD RIGHT  ARM  Final   Special Requests   Final    BOTTLES DRAWN AEROBIC ONLY Blood Culture adequate volume   Culture   Final    NO GROWTH 2 DAYS Performed at [facility], [address] [place], [place] [address]    Report Status PENDING  Incomplete  Culture, blood (routine x 2)     Status: None (Preliminary result)   Collection Time: 03/14/18  9:15 PM  Result Value Ref Range Status   Specimen Description BLOOD RIGHT ARM  Final   Special Requests   Final    BOTTLES DRAWN AEROBIC ONLY Blood Culture adequate volume   Culture   Final    NO GROWTH 2 DAYS Performed at [facility], [address] [place], [place] [address]    Report Status PENDING  Incomplete    Impression/Plan:  1. 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  Was found in urine, awaiting for blood results to confirm if true.  No other source of fever, infection.  Will start oral valganciclovir and monitor.    2.  Renal transplant - recently reduced immunosuppression.    3.  Fever - unclear cause, seems to be resolving, may be due to #1.

## 2018-03-18 NOTE — Progress Notes (Signed)
[facility] Progress Note  Subjective : fevers a little better, moved to SDU and started on IV insulin drip.  Creat 2.6 today.   Vitals:   03/18/18 0422 03/18/18 0550 03/18/18 0825 03/18/18 0951  BP: (!) 136/44 (!) 148/48 (!) 142/47 (!) 151/53  Pulse: 69  72 65  Resp: 18  20   Temp: 98.7 F (37.1 C)   98.4 F (36.9 C)  TempSrc: Oral   Oral  SpO2: 93%  93%   Weight:      Height:        Inpatient medications: . amLODipine  10 mg Oral Daily  . doxazosin  4 mg Oral QPM  . ferrous sulfate  325 mg Oral Daily  . heparin injection (subcutaneous)  5,000 Units Subcutaneous Q8H  . hydrALAZINE  100 mg Oral Q8H  . insulin pump   Subcutaneous TID AC & HS  . isosorbide mononitrate  60 mg Oral BID  . metoprolol tartrate  50 mg Oral BID  . mycophenolate  360 mg Oral Daily   And  . mycophenolate  180 mg Oral QPM  . polyethylene glycol  34 g Oral Once  . potassium chloride  20 mEq Oral Once  . pravastatin  40 mg Oral QHS  . predniSONE  5 mg Oral QAC breakfast  . sulfamethoxazole-trimethoprim  1 tablet Oral Q M,W,F  . tacrolimus  2 mg Oral BID  . valGANciclovir  450 mg Oral Q1200   . dextrose 5 % and 0.45% NaCl 75 mL/hr at 03/18/18 0501  . insulin (NOVOLIN-R) infusion 4.1 Units/hr (03/18/18 1110)   acetaminophen, bisacodyl, hydrALAZINE, ondansetron (ZOFRAN) IV, oxyCODONE, polyethylene glycol  Exam: Gen alert, no distress No rash, cyanosis or gangrene Sclera anicteric, throat clear  No jvd or bruits Chest clear bilat to bases RRR no MRG Abd soft ntnd no mass or ascites +bs  MS no joint effusions or deformity, L leg in large brace and wrapped underneath Ext no LE or UE edema, no wounds or ulcers Neuro is alert, Ox 3 , nf LUA AVF +bruit.     Home meds:  - norvasc 5/ cardura 4 hs/ lasix 80 qd/ hydralazine 100 tid/ metoprolol 50 bid  - myfortic 360 am + 180 pm/ prograf 2 mg bid/ pred 5 mg qd  - statin/ bactrim MWF/ imdur 60/ ecasa/ feso4  - insulin  pump    Impression: 1  Renal transplant '17 -  on appropriate meds, baseline in 1.6- 1.9 range here. Creat bumped after IV lasix and 3 L diuresis. Coming down now w/ IVF's. Holding home po lasix.  2  Hypertension - BP's good on home meds x 4 3  Fevers - w/u in progress per ID, urine PCR + for CMV, blood PCR pending; started on oral valganciclovir 4  Tibial shaft fracture - after fall, sp ORIF on 6/7.   5  DM2 uncont - on insulin drip now   Plan - as above   Vinson Moselleob Arnette Driggs MD [facility] pager [phone]   03/18/2018, 12:15 PM   Recent Labs  Lab 03/18/18 0237 03/18/18 0413 03/18/18 0939  NA 133* 133* 133*  K 3.3* 3.6 3.3*  CL 97* 97* 99*  CO2 23 21* 22  GLUCOSE 116* 169* 190*  BUN 66* 65* 66*  CREATININE 2.58* 2.61* 2.46*  CALCIUM 8.1* 8.0* 8.2*   No results for input(s): AST, ALT, ALKPHOS, BILITOT, PROT, ALBUMIN in the last 168 hours. Recent Labs  Lab 03/15/18 0343 03/16/18 0435  03/17/18 0712  WBC 7.2 9.3 9.1  HGB 10.6* 11.1* 10.9*  HCT 33.3* 34.2* 33.9*  MCV 96.2 95.8 96.3  PLT 159 172 179   Iron/TIBC/Ferritin/ %Sat No results found for: IRON, TIBC, FERRITIN, IRONPCTSAT

## 2018-03-18 NOTE — Progress Notes (Signed)
  Echocardiogram 2D Echocardiogram has been performed.  Celene SkeenVijay  Faviola Klare 03/18/2018, 9:03 AM

## 2018-03-18 NOTE — Progress Notes (Signed)
PHARMACY NOTE:  ANTIMICROBIAL RENAL DOSAGE ADJUSTMENT  Current antimicrobial regimen includes a mismatch between antimicrobial dosage and estimated renal function.  As per policy approved by the Pharmacy & Therapeutics and Medical Executive Committees, the antimicrobial dosage will be adjusted accordingly.  Current antimicrobial dosage:  Ganciclovir 5 mg/kg IV q12h  Indication: CMV  Renal Function: Estimated Creatinine Clearance: 25.9 mL/min (A) (by C-G formula based on SCr of 2.61 mg/dL (H)). []      On intermittent HD, scheduled: []      On CRRT    Antimicrobial dosage has been changed to:  Ganciclovir 2.5 mg IV q24h   Baldemar FridayMasters, Charon Akamine M, [facility] 03/18/2018 10:53 AM

## 2018-03-18 NOTE — Progress Notes (Signed)
MD gave orders to transfer to stepdown. Pt placement found bed on 4E RM 12. RN called receiving RN and gave report. RN and tech transferred pt to 4E RM 12. RN spoke with receiving RN befor leaving.

## 2018-03-18 NOTE — Progress Notes (Signed)
PROGRESS NOTE    Hannah Valentine  ZOX:[id number] DOB: 01/25/1956 DOA: 03/11/2018 PCP: Shellia CleverlyBeane, Lori M, PA   Brief Narrative:  Hannah GarretDonna Valentine avery pleasant 62 y.o.femalewith medical history significant for HTN,CKD stage III, the patient has a history of kidney transplant in 2017, hyperlipidemia, diabetes on insulin pump, presents with mechanical fall, sustained a tibial fracture, status post surgical repair by Dr. Jena Valentine , patient continues to have intermittent fever infectious work-up remains negative. ID consulted.  Pancultured.  CMV EBV titers pending.     Assessment & Plan:   Principal Problem:   Left tibial fracture Active Problems:   Fall   History of kidney transplant   Essential hypertension   HLD (hyperlipidemia)   CKD (chronic kidney disease), stage III (HCC)   DM (diabetes mellitus), type 1 (HCC)   Closed fracture of upper end of left fibula   Trauma   FUO (fever of unknown origin)   Hypertensive crisis   Prediabetes   Acute blood loss anemia   Fever  Left tibial fracture;  -Secondary to mechanical fall, S/P ORIF of left tibial shaft fracture.  - Management per ORTHO.  -Followed by PT, CIR consulted and following..  DM type 1;Early DKA -Patient was on insulin pump, she is quite comfortable with using the pump, DM coordinator helping with the pump, she reports last A1c was 6.4. CBGs noted to be elevated in the 300s 03/17/2018.  Urinalysis which was done unremarkable.  Beta hydroxybutyrate was elevated.  Patient was noted to have an anion gap as high as 21 and noted to be acidotic on 03/17/2018.  Patient subsequently transitioned to the stepdown unit and placed on the insulin drip.  Anion gap closed.  Acidosis resolved.  Will transition patient back to insulin pump and discontinue glucose stabilizer 2 hours after insulin pump has been started.  Continue gentle hydration.  Follow.   CKD stage III - status post renal transplant 2017, on oral immunosuppressant  therapy. Baseline getting around 1.7-1.9. Renal function worsening and currently at 2.34 from 2.32 from 2.46.  Lasix has been discontinued per nephrology.  Continue gentle hydration with IV fluids.  Nephrology following. Continue current immunosuppressive treatments as well as Bactrim for prophylaxis.??  Discontinue Bactrim will defer to nephrology.  Continue Prograf.  Avoid nephrotoxins.  Fever -Patient with fever curve trending down.  Chest x-ray negative.  Blood cultures with no growth to date.  Continues to have intermittent fever,  unremarkable.  Chest x-ray negative.  Blood cultures pending.  CMV EBV titers obtained per ID.  Patient on Bactrim for prophylaxis.  Patient noted to be refusing DVT prophylaxis with subcu heparin.  Lower extremity Dopplers were done were negative for DVT.  Patient has been reassessed by orthopedics who does not feel infection is coming from recent surgery. Per ID CMV urine positive however serum pending.  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  ID following and appreciate input and recommendations.     HTN;  -Blood pressure improved with current dose of Norvasc 10 mg daily, Cardura, hydralazine, Imdur, metoprolol.  Hydralazine as needed.       DVT prophylaxis: Heparin Code Status: Full Family Communication: Updated patient and husband at bedside. Disposition Plan: CIR when medically stable.   Consultants:   Orthopedics: Dr. Jena Valentine 03/12/2018  Nephrology: Dr. Rae LipsScherttz 03/14/2018  CIR: Dr Allena KatzPatel 03/15/2018  Infectious disease: Dr. Luciana Axeomer 03/16/2018  Procedures:   CT left knee 03/11/2018  Chest x-ray 03/15/2018   plain films  of the left tib-fib 03/12/2018, 03/11/2018  Lower extremity Dopplers 03/16/2018  ORIF left proximal tibial fracture per Dr. [name] 03/12/2018  Antimicrobials:   Prophylactic Bactrim 03/12/2018  ValGanciclovir 03/18/2018   Subjective: Patient sitting up in chair.  States feels a little bit  better than she did yesterday.  Some nausea however no emesis.  Tolerating oral intake.  No chest pain.  No shortness of breath.  No abdominal pain.    Objective: Vitals:   03/18/18 0422 03/18/18 0550 03/18/18 0825 03/18/18 0951  BP: (!) 136/44 (!) 148/48 (!) 142/47 (!) 151/53  Pulse: 69  72 65  Resp: 18  20   Temp: 98.7 F (37.1 C)   98.4 F (36.9 C)  TempSrc: Oral   Oral  SpO2: 93%  93%   Weight:      Height:        Intake/Output Summary (Last 24 hours) at 03/18/2018 1411 Last data filed at 03/18/2018 0313 Gross per 24 hour  Intake 755.76 ml  Output -  Net 755.76 ml   Filed Weights   03/16/18 1135 03/17/18 0425 03/17/18 1047  Weight: 94.1 kg (207 lb 7.3 oz) 93.1 kg (205 lb 4 oz) 91.2 kg (201 lb 1 oz)    Examination:  General exam: Appears calm and comfortable  Respiratory system: Lungs clear to auscultation bilaterally.  No wheezes, no crackles, no rhonchi.  Respiratory effort normal. Cardiovascular system: Regular rate and rhythm no murmurs rubs or gallops.  No JVD.  No lower extremity edema.   Gastrointestinal system: Abdomen is soft, nontender, nondistended, positive bowel sounds.  No rebound.  No guarding.   Central nervous system: Alert and oriented. No focal neurological deficits. Extremities: Left lower extremity with some ecchymosis and some erythema, hinged brace.  Symmetric 5 x 5 power. Skin: No rashes, lesions or ulcers Psychiatry: Judgement and insight appear normal. Mood & affect appropriate.     Data Reviewed: I have personally reviewed following labs and imaging studies  CBC: Recent Labs  Lab 03/13/18 0331 03/14/18 0403 03/15/18 0343 03/16/18 0435 03/17/18 0712  WBC 8.7 7.6 7.2 9.3 9.1  HGB 11.6* 11.4* 10.6* 11.1* 10.9*  HCT 37.3 36.7 33.3* 34.2* 33.9*  MCV 99.2 99.2 96.2 95.8 96.3  PLT 152 149* 159 172 179   Basic Metabolic Panel: Recent Labs  Lab 03/17/18 2200 03/18/18 0237 03/18/18 0413 03/18/18 0939 03/18/18 1230  NA 132* 133* 133*  133* 133*  K 2.9* 3.3* 3.6 3.3* 3.6  CL 95* 97* 97* 99* 98*  CO2 23 23 21* 22 22  GLUCOSE 246* 116* 169* 190* 154*  BUN 65* 66* 65* 66* 67*  CREATININE 2.65* 2.58* 2.61* 2.46* 2.32*  CALCIUM 8.3* 8.1* 8.0* 8.2* 8.4*  MG 1.9  --   --   --   --    GFR: Estimated Creatinine Clearance: 29.1 mL/min (A) (by C-G formula based on SCr of 2.32 mg/dL (H)). Liver Function Tests: No results for input(s): AST, ALT, ALKPHOS, BILITOT, PROT, ALBUMIN in the last 168 hours. No results for input(s): LIPASE, AMYLASE in the last 168 hours. No results for input(s): AMMONIA in the last 168 hours. Coagulation Profile: No results for input(s): INR, PROTIME in the last 168 hours. Cardiac Enzymes: Recent Labs  Lab 03/11/18 1550  TROPONINI <0.03   BNP (last 3 results) No results for input(s): PROBNP in the last 8760 hours. HbA1C: No results for input(s): HGBA1C in the last 72 hours. CBG: Recent Labs  Lab 03/18/18 0822 03/18/18 0948 03/18/18  1108 03/18/18 1215 03/18/18 1346  GLUCAP 202* 176* 162* 158* 137*   Lipid Profile: No results for input(s): CHOL, HDL, LDLCALC, TRIG, CHOLHDL, LDLDIRECT in the last 72 hours. Thyroid Function Tests: No results for input(s): TSH, T4TOTAL, FREET4, T3FREE, THYROIDAB in the last 72 hours. Anemia Panel: No results for input(s): VITAMINB12, FOLATE, FERRITIN, TIBC, IRON, RETICCTPCT in the last 72 hours. Sepsis Labs: No results for input(s): PROCALCITON, LATICACIDVEN in the last 168 hours.  Recent Results (from the past 240 hour(s))  MRSA PCR Screening     Status: None   Collection Time: 03/11/18  6:21 PM  Result Value Ref Range Status   MRSA by PCR NEGATIVE NEGATIVE Final    Comment:        The GeneXpert MRSA Assay (FDA approved for NASAL specimens only), is one component of a comprehensive MRSA colonization surveillance program. It is not intended to diagnose MRSA infection nor to guide or monitor treatment for MRSA infections. Performed at [facility], [address] [place], [place] [address]   Culture, blood (routine x 2)     Status: None (Preliminary result)   Collection Time: 03/14/18  9:10 PM  Result Value Ref Range Status   Specimen Description BLOOD RIGHT ARM  Final   Special Requests   Final    BOTTLES DRAWN AEROBIC ONLY Blood Culture adequate volume   Culture   Final    NO GROWTH 3 DAYS Performed at [facility], [address], [place], [place] [address]    Report Status PENDING  Incomplete  Culture, blood (routine x 2)     Status: None (Preliminary result)   Collection Time: 03/14/18  9:15 PM  Result Value Ref Range Status   Specimen Description BLOOD RIGHT ARM  Final   Special Requests   Final    BOTTLES DRAWN AEROBIC ONLY Blood Culture adequate volume   Culture   Final    NO GROWTH 3 DAYS Performed at [facility], [address] [place], [place] [address]    Report Status PENDING  Incomplete         Radiology Studies: No results found.      Scheduled Meds: . amLODipine  10 mg Oral Daily  . doxazosin  4 mg Oral QPM  . ferrous sulfate  325 mg Oral Daily  . heparin injection (subcutaneous)  5,000 Units Subcutaneous Q8H  . hydrALAZINE  100 mg Oral Q8H  . insulin pump   Subcutaneous TID AC & HS  . isosorbide mononitrate  60 mg Oral BID  . metoprolol tartrate  50 mg Oral BID  . mycophenolate  360 mg Oral Daily   And  . mycophenolate  180 mg Oral QPM  . polyethylene glycol  34 g Oral Once  . pravastatin  40 mg Oral QHS  . predniSONE  5 mg Oral QAC breakfast  . sulfamethoxazole-trimethoprim  1 tablet Oral Q M,W,F  . tacrolimus  2 mg Oral BID  . valGANciclovir  450 mg Oral Q1200   Continuous Infusions: . dextrose 5 % and 0.45% NaCl 75 mL/hr at 03/18/18 0501  . insulin (NOVOLIN-R) infusion 2.3 Units/hr (03/18/18 1347)     LOS: 7 days    Time spent: 40 minutes    Ramiro Harvest, MD [facility] Pager [phone]  If 7PM-7AM, please contact  night-coverage [email or web address] Password [facility] 03/18/2018, 2:11 PM

## 2018-03-18 NOTE — Progress Notes (Signed)
Inpatient Rehabilitation Admissions Coordinator  I continue to follow pt's case. I await medical readiness to admit to inpt rehab, I met with patient and spouse at bedside.  [name] Boyette, RN, MSN Rehab Admissions Coordinator [phone] 03/18/2018 3:42 PM  

## 2018-03-18 NOTE — Progress Notes (Signed)
Inpatient Diabetes Program Recommendations  AACE/ADA: New Consensus Statement on Inpatient Glycemic Control (2015)  Target Ranges:  Prepandial:   less than 140 mg/dL      Peak postprandial:   less than 180 mg/dL (1-2 hours)      Critically ill patients:  140 - 180 mg/dL   Lab Results  Component Value Date   GLUCAP 202 (H) 03/18/2018   HGBA1C 5.8 (H) 03/11/2018    Review of Glycemic Control  Pt found to be in DKA and placed on DKA orders with IV insulin. Continue drip until parameters met for discontinuation. Pt will likely transition back to insulin pump if her supplies are available. Will need to start pump 2 hours prior to discontinuation of drip. Will remind pt that new site is needed.  CO2 - 21 AG - Down from 21 to 15.  Inpatient Diabetes Program Recommendations:     Will follow closely.  Thank you. Lorenda Peck, RD, LDN, CDE Inpatient Diabetes Coordinator [phone]

## 2018-03-18 NOTE — Progress Notes (Signed)
Physical Therapy Treatment Patient Details Name: Hannah Valentine Enrico MRN: [id number] DOB: 12/05/1955 Today's Date: 03/18/2018    History of Present Illness  62 y.o. female with medical history significant for HTN, CKD stage III with LUE fistula (pt reports cannot lift more than 5 lbs with left due to fistula is tenuous), the patient has a history of kidney transplant in 2017, hyperlipidemia, diabetes on insulin pump, who was brought by the EMS, says she suffered a mechanical fall.  In review, the patient tripped over her dog,and sustained left proximal tibia/fibula fracture; s/p ORIF 03/12/18    PT Comments    Pt was seen for attempted transfers but having just moved to bed did there exercise.  Her plan is still for CIR and is motivated to work hard for the progress for home.  Follow acutely for strengthening within her limits and standing balance and hopped steps as able.     Follow Up Recommendations  CIR     Equipment Recommendations  Wheelchair (measurements PT);Hospital bed    Recommendations for Other Services Rehab consult     Precautions / Restrictions Precautions Precautions: Fall Precaution Comments: 5# LUE lifting limit due to fistula changes Required Braces or Orthoses: Other Brace/Splint Other Brace/Splint: hinged brace LLE (locked at 0), ortho PA reports brace can be unlocked when up and locked at night for sleeping.   Restrictions Weight Bearing Restrictions: Yes LLE Weight Bearing: Touchdown weight bearing Other Position/Activity Restrictions: see precautions re: LUE    Mobility  Bed Mobility Overal bed mobility: Needs Assistance Bed Mobility: Rolling Rolling: Min guard   Supine to sit: Min assist;HOB elevated     General bed mobility comments: Min A to manage LLE   Transfers Overall transfer level: Needs assistance Equipment used: None Transfers: Lateral/Scoot Transfers          Lateral/Scoot Transfers: Min assist;+2 safety/equipment General transfer  comment: in bed and tired when PT arrived  Ambulation/Gait                 Stairs             Wheelchair Mobility    Modified Rankin (Stroke Patients Only)       Balance Overall balance assessment: Needs assistance Sitting-balance support: No upper extremity supported;Feet supported Sitting balance-Leahy Scale: Fair                                      Cognition Arousal/Alertness: Awake/alert Behavior During Therapy: WFL for tasks assessed/performed Overall Cognitive Status: Within Functional Limits for tasks assessed                                 General Comments: wants to get her mobility to increase despite pain      Exercises Total Joint Exercises Ankle Circles/Pumps: PROM;Left;5 reps;AROM;Right Quad Sets: AROM;Left;10 reps Hip ABduction/ADduction: AROM;AAROM;Both;10 reps General Exercises - Lower Extremity Straight Leg Raises: AROM;Right;10 reps Hip Flexion/Marching: AAROM;Left;5 reps    General Comments        Pertinent Vitals/Pain Pain Assessment: 0-10 Pain Score: 6  Pain Location: LLE (intermittently with movement) Pain Descriptors / Indicators: Operative site guarding Pain Intervention(s): Limited activity within patient's tolerance;Monitored during session;Premedicated before session;Repositioned    Home Living                      Prior  Function            PT Goals (current goals can now be found in the care plan section) Acute Rehab PT Goals Patient Stated Goal: to get s tronger and get done faster with rehab Progress towards PT goals: Progressing toward goals    Frequency    Min 3X/week      PT Plan Current plan remains appropriate    Co-evaluation              AM-PAC PT "6 Clicks" Daily Activity  Outcome Measure  Difficulty turning over in bed (including adjusting bedclothes, sheets and blankets)?: A Little Difficulty moving from lying on back to sitting on the side of  the bed? : A Little Difficulty sitting down on and standing up from a chair with arms (e.g., wheelchair, bedside commode, etc,.)?: A Little Help needed moving to and from a bed to chair (including a wheelchair)?: A Little Help needed walking in hospital room?: A Little Help needed climbing 3-5 steps with a railing? : A Little 6 Click Score: 18    End of Session   Activity Tolerance: Patient tolerated treatment well Patient left: in bed;with call bell/phone within reach;with nursing/sitter in room;with family/visitor present Nurse Communication: Mobility status PT Visit Diagnosis: History of falling (Z91.81);Difficulty in walking, not elsewhere classified (R26.2);Pain Pain - Right/Left: Left Pain - part of body: Leg     Time: 1610-9604 PT Time Calculation (min) (ACUTE ONLY): 21 min  Charges:  $Therapeutic Exercise: 8-22 mins                    G Codes:  Functional Assessment Tool Used: AM-PAC 6 Clicks Basic Mobility     Ivar Drape 03/18/2018, 4:55 PM   Samul Dada, PT MS Acute Rehab Dept. Number: [facility] [id number] and [facility] [phone]

## 2018-03-18 NOTE — Progress Notes (Addendum)
Md orders for starting insulin gtt. Gtt started on admission to [facility]. The first CBG reading was 131 at initiation of gtt. Patient sugar relatively stable so paged [facility] about patient condition. Next two sugars 113 at 130am and 115 at 2:30am. Advised to keep drip off and stop d5 1/2 NS and start NS @ 75 and she will place new orders after downtime.Will continue to monitor and wait for morning labs

## 2018-03-19 DIAGNOSIS — B259 Cytomegaloviral disease, unspecified: Secondary | ICD-10-CM

## 2018-03-19 DIAGNOSIS — Z5181 Encounter for therapeutic drug level monitoring: Secondary | ICD-10-CM

## 2018-03-19 LAB — BASIC METABOLIC PANEL
Anion gap: 9 (ref 5–15)
BUN: 58 mg/dL — AB (ref 6–20)
CHLORIDE: 99 mmol/L — AB (ref 101–111)
CO2: 21 mmol/L — AB (ref 22–32)
Calcium: 7.7 mg/dL — ABNORMAL LOW (ref 8.9–10.3)
Creatinine, Ser: 1.99 mg/dL — ABNORMAL HIGH (ref 0.44–1.00)
GFR calc non Af Amer: 26 mL/min — ABNORMAL LOW (ref >60)
GFR, EST AFRICAN AMERICAN: 30 mL/min — AB (ref >60)
Glucose, Bld: 272 mg/dL — ABNORMAL HIGH (ref 65–99)
Potassium: 3.4 mmol/L — ABNORMAL LOW (ref 3.5–5.1)
Sodium: 129 mmol/L — ABNORMAL LOW (ref 135–145)

## 2018-03-19 LAB — GLUCOSE, CAPILLARY
GLUCOSE-CAPILLARY: 115 mg/dL — AB (ref 65–99)
GLUCOSE-CAPILLARY: 93 mg/dL (ref 65–99)
Glucose-Capillary: 126 mg/dL — ABNORMAL HIGH (ref 65–99)
Glucose-Capillary: 129 mg/dL — ABNORMAL HIGH (ref 65–99)
Glucose-Capillary: 162 mg/dL — ABNORMAL HIGH (ref 65–99)
Glucose-Capillary: 93 mg/dL (ref 65–99)

## 2018-03-19 LAB — BK QUANT PCR (URINE)
BK VIRUS DNA QN PCR: 5.583 {Log_copies}/mL
BK Virus DNA, Urine: 382400 copies/mL

## 2018-03-19 LAB — CBC
HCT: 30.3 % — ABNORMAL LOW (ref 36.0–46.0)
Hemoglobin: 10 g/dL — ABNORMAL LOW (ref 12.0–15.0)
MCH: 31.1 pg (ref 26.0–34.0)
MCHC: 33 g/dL (ref 30.0–36.0)
MCV: 94.1 fL (ref 78.0–100.0)
PLATELETS: 175 10*3/uL (ref 150–400)
RBC: 3.22 MIL/uL — AB (ref 3.87–5.11)
RDW: 14 % (ref 11.5–15.5)
WBC: 7.5 10*3/uL (ref 4.0–10.5)

## 2018-03-19 MED ORDER — POTASSIUM CHLORIDE CRYS ER 20 MEQ PO TBCR
20.0000 meq | EXTENDED_RELEASE_TABLET | Freq: Once | ORAL | Status: AC
  Administered 2018-03-19: 20 meq via ORAL
  Filled 2018-03-19: qty 1

## 2018-03-19 MED ORDER — MYCOPHENOLATE SODIUM 180 MG PO TBEC
180.0000 mg | DELAYED_RELEASE_TABLET | Freq: Two times a day (BID) | ORAL | Status: DC
  Administered 2018-03-19 – 2018-03-22 (×6): 180 mg via ORAL
  Filled 2018-03-19 (×7): qty 1

## 2018-03-19 NOTE — Care Management Important Message (Signed)
Important Message  Patient Details  Name: Hannah Valentine MRN: [id number] Date of Birth: 04/13/1956   Medicare Important Message Given:  Yes    Oralia RudMegan P Javante Nilsson 03/19/2018, 3:44 PM

## 2018-03-19 NOTE — Progress Notes (Signed)
[facility]   Reason for visit: Follow up on fever   Interval History: CMV in urine positive and now blood also positive, < 200.  Feels better overall, no fever, WBC WNL.  No associated rash.  No new positive cultures otherwise   Physical Exam: Constitutional:  Vitals:   03/19/18 1331 03/19/18 1449  BP: (!) 124/44 (!) 144/51  Pulse: 69 67  Resp: 17 18  Temp: 98.4 F (36.9 C)   SpO2: 95%    patient appears in NAD Eyes: anicteric Respiratory: Normal respiratory effort; CTA B Cardiovascular: RRR Skin: no rash  Review of Systems: Constitutional: negative for fevers and chills Gastrointestinal: negative for nausea and diarrhea Integument/breast: negative for rash  Lab Results  Component Value Date   WBC 7.5 03/19/2018   HGB 10.0 (L) 03/19/2018   HCT 30.3 (L) 03/19/2018   MCV 94.1 03/19/2018   PLT 175 03/19/2018    Lab Results  Component Value Date   CREATININE 1.99 (H) 03/19/2018   BUN 58 (H) 03/19/2018   NA 129 (L) 03/19/2018   K 3.4 (L) 03/19/2018   CL 99 (L) 03/19/2018   CO2 21 (L) 03/19/2018    Lab Results  Component Value Date   ALT 20 10/03/2017   AST 33 10/03/2017   ALKPHOS 62 10/03/2017     Microbiology: Recent Results (from the past 240 hour(s))  MRSA PCR Screening     Status: None   Collection Time: 03/11/18  6:21 PM  Result Value Ref Range Status   MRSA by PCR NEGATIVE NEGATIVE Final    Comment:        The GeneXpert MRSA Assay (FDA approved for NASAL specimens only), is one component of a comprehensive MRSA colonization surveillance program. It is not intended to diagnose MRSA infection nor to guide or monitor treatment for MRSA infections. Performed at [facility], [address], [place], [place] [address]   Culture, blood (routine x 2)     Status: None (Preliminary result)   Collection Time: 03/14/18  9:10 PM  Result Value Ref Range Status   Specimen Description BLOOD RIGHT ARM  Final   Special  Requests   Final    BOTTLES DRAWN AEROBIC ONLY Blood Culture adequate volume   Culture   Final    NO GROWTH 4 DAYS Performed at [facility], [address], [place], [place] [address]    Report Status PENDING  Incomplete  Culture, blood (routine x 2)     Status: None (Preliminary result)   Collection Time: 03/14/18  9:15 PM  Result Value Ref Range Status   Specimen Description BLOOD RIGHT ARM  Final   Special Requests   Final    BOTTLES DRAWN AEROBIC ONLY Blood Culture adequate volume   Culture   Final    NO GROWTH 4 DAYS Performed at [facility], 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  On renally dosed induction therapy at 450 mg po daily.  Continue with this for 21 days and she should follow up with her transplant nephrologists for further evaluation and likely repeat CMV PCR.    2.  Fever - I suspect the CMV is the cause and is improving, afebrile > 48 hours.    3.  Medication monitoring - she will need a cbc and cmp weekly on  treatment to assure no worsening renal function or leukopenia on valacyclovir.    I will sign off, thanks for consultation.

## 2018-03-19 NOTE — Care Management Note (Signed)
Case Management Note Donn PieriniKristi Jony Ladnier RN, BSN Unit 4E-Case Manager [phone]  Patient Details  Name: Lorane GellDonna Komorowski MRN: [id number] Date of Birth: 01/27/1956  Subjective/Objective:  Pt admitted s/p fall with  left tibial fx- s/p ORIF on 03/12/18- post op coarse complicated by intermittent fevers and early DKA (wears insulin pump at home)  Pt tx to 4E on 6/13 for glucostablizer           Action/Plan: PTA pt lived at home, per PT/OT evals CIR recommended, consult placed to CIR for possible admission- [name] with CIR is following for possible admission when medically stable. CM will follow for transition of care  Expected Discharge Date:                  Expected Discharge Plan:  IP Rehab Facility  In-House Referral:  Clinical Social Work  Discharge planning Services  CM Consult  Post Acute Care Choice:  IP Rehab Choice offered to:  Patient  DME Arranged:    DME Agency:     HH Arranged:    HH Agency:     Status of Service:  In process, will continue to follow  If discussed at Long Length of Stay Meetings, dates discussed:    Discharge Disposition:   Additional Comments:  [name], [name], RN 03/19/2018, 4:08 PM

## 2018-03-19 NOTE — Progress Notes (Signed)
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 amLODipine  10 mg Oral Daily  . doxazosin  4 mg Oral QPM  . ferrous sulfate  325 mg Oral Daily  . heparin injection (subcutaneous)  5,000 Units Subcutaneous Q8H  . hydrALAZINE  100 mg Oral Q8H  . insulin pump   Subcutaneous TID AC & HS  . isosorbide mononitrate  60 mg Oral BID  . metoprolol tartrate  50 mg Oral BID  . mycophenolate  360 mg Oral Daily   And  . mycophenolate  180 mg Oral QPM  . polyethylene glycol  34 g Oral Once  . pravastatin  40 mg Oral QHS  . predniSONE  5 mg Oral QAC breakfast  . sulfamethoxazole-trimethoprim  1 tablet Oral Q M,W,F  . tacrolimus  2 mg Oral BID  . valGANciclovir  450 mg Oral Q1200   Continuous Infusions: PRN Meds:.acetaminophen, bisacodyl, hydrALAZINE, ondansetron (ZOFRAN) IV, oxyCODONE, polyethylene glycol  Current Labs: reviewed  Results for Hannah Valentine, Hannah Valentine (MRN [id number]) as of 03/19/2018 14:19  Ref. Range 03/16/2018 11:49  CMV Quant DNA PCR (Plasma) Latest Ref Range: Negative IU/mL Positive < 200    Physical Exam:  Blood pressure (!) 157/50, pulse 70, temperature 98.4 F (36.9 C), temperature source Oral, resp. rate 20, height 5\' 7"  (1.702 m), weight 94.7 kg (208 lb 12.4 oz), SpO2 94 %. NAD NCAT EOMI No rashes/icterus CTAB S/nt/nd RRR nl s1s2 has contin soft murmur from AVF LUE AVF +B/T L leg braced  A 1. AoCKD (BL SCr 1.6-1.9) s/p KT 2017 [facility]; resolving 2. HTN norvasc, cardura, hydralazine, MTP 3. IS on Tac/MMF/Pred/ TIW DS TMP/SMX 4. CMV  Viremia, low level; RCID following; now on Valcyte 5. S/p Tibial Shift Fx and ORIF 6. DM2 7. Deconditioning likely to CIR  P 1. Reduce MMF to 180 BID 2. Daily weights, Daily Renal Panel, Strict I/Os, Avoid nephrotoxins (NSAIDs, judicious IV Contrast)    Sabra Heckyan Jisell Majer MD 03/19/2018, 1:23 PM  Recent Labs  Lab 03/18/18 1230 03/18/18 1702 03/19/18 0509  NA 133* 132* 129*  K 3.6 3.8 3.4*  CL 98* 96* 99*  CO2 22 22 21*  GLUCOSE 154* 157* 272*  BUN 67* 65* 58*  CREATININE 2.32* 2.34* 1.99*  CALCIUM 8.4* 8.4* 7.7*   Recent Labs  Lab 03/16/18 0435 03/17/18 0712 03/19/18 0509  WBC 9.3 9.1 7.5  HGB 11.1* 10.9* 10.0*  HCT 34.2* 33.9* 30.3*  MCV 95.8 96.3 94.1  PLT 172 179 175

## 2018-03-19 NOTE — Progress Notes (Signed)
Pt reported that she stated to fill funny and new that her blood sugar was not dropping, and she check her BS, it was 157 and she gives herself from her insulin pump. 0.7 unit of insulin.

## 2018-03-19 NOTE — Progress Notes (Signed)
PROGRESS NOTE    Hannah Valentine  ZOX:[id number] DOB: January 20, 1956 DOA: 03/11/2018 PCP: [name], PA   Brief Narrative:  Hannah Valentine avery pleasant 62 y.o.femalewith medical history significant for HTN,CKD stage III, the patient has a history of kidney transplant in 2017, hyperlipidemia, diabetes on insulin pump, presents with mechanical fall, sustained a tibial fracture, status post surgical repair by Dr. [name] , patient continues to have intermittent fever infectious work-up remains negative. ID consulted.  Pancultured.  CMV positive.  EBV titers negative.  Patient started on valganciclovir..     Assessment & Plan:   Principal Problem:   Left tibial fracture Active Problems:   Fall   History of kidney transplant   Essential hypertension   HLD (hyperlipidemia)   CKD (chronic kidney disease), stage III (HCC)   DM (diabetes mellitus), type 1 (HCC)   Closed fracture of upper end of left fibula   Trauma   FUO (fever of unknown origin)   Hypertensive crisis   Prediabetes   Acute blood loss anemia   Fever  Left tibial fracture;  -Secondary to mechanical fall, S/P ORIF of left tibial shaft fracture.  - Management per ORTHO.  -Followed by PT, CIR consulted and following..  DM type 1;Early DKA -Patient was on insulin pump, she is quite comfortable with using the pump, DM coordinator helping with the pump, she reports last A1c was 6.4. CBGs noted to be elevated in the 300s 03/17/2018.  Urinalysis which was done unremarkable.  Beta hydroxybutyrate was elevated.  Patient was noted to have an anion gap as high as 21 and noted to be acidotic on 03/17/2018.  Patient subsequently transitioned to the stepdown unit and placed on the insulin drip.  Anion gap closed.  Acidosis resolved.  She has been transitioned back to her insulin pump.  CBGs ranging from 93-162.  CBGs to before meals and at bedtime.  Discontinue IV fluids.  Follow.     CKD stage III - status post renal transplant  2017, on oral immunosuppressant therapy. Baseline getting around 1.7-1.9. Renal function to trend back down and currently at 1.99 from 2.34 from 2.32 from 2.46.  Lasix has been discontinued per nephrology.  Saline lock IV fluids.  Will defer resumption of diuretics to nephrology.  Continue current immunosuppressive treatments with Prograf, prednisone.  Continue Bactrim.  Avoid nephrotoxins.  Per nephrology.    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  Fever curve trending down.  Chest x-ray negative.  Blood cultures with no growth to date.  Patient with fever curve trending down.  Chest x-ray negative.  Blood cultures with no growth to date.  CMV DNA PCR urine positive.  EBV DNA PCR titers negative. Patient on Bactrim for prophylaxis.  Patient noted to be refusing DVT prophylaxis with subcu heparin.  Lower extremity Dopplers were done were negative for DVT.  Patient has been reassessed by orthopedics who does not feel infection is coming from recent surgery. Patient started on valganciclovir per ID for CMV infection. ID following and appreciate input and recommendations.     HTN;  -Continue current dose of Norvasc, Cardura, hydralazine, Imdur, metoprolol.  Hydralazine as needed.   Hyponatremia Likely secondary to hypervolemic hyponatremia.  Patient with some bibasilar crackles.  Saline lock IV fluids.  Strict I's and O's.  Daily weights.  Will defer resumption of Lasix to nephrology.  Follow for now.      DVT prophylaxis: Heparin Code Status: Full Family Communication: Updated patient.  No family  at bedside.  Disposition Plan: Transfer to telemetry.  CIR when medically stable.   Consultants:   Orthopedics: Dr. [name] 03/12/2018  Nephrology: Dr. Rae Lips 03/14/2018  CIR: Dr Allena Katz 03/15/2018  Infectious disease: Dr. Luciana Axe 03/16/2018  Procedures:   CT left knee 03/11/2018  Chest x-ray 03/15/2018   plain films of the left tib-fib 03/12/2018, 03/11/2018  Lower  extremity Dopplers 03/16/2018  ORIF left proximal tibial fracture per Dr. [name] 03/12/2018  Antimicrobials:   Prophylactic Bactrim 03/12/2018  ValGanciclovir 03/18/2018   Subjective: Patient in bed.  Denies any chest pain.  No shortness of breath.  States she is feeling better however not at baseline.  No abdominal pain.  Objective: Vitals:   03/18/18 2102 03/18/18 2354 03/19/18 0419 03/19/18 0808  BP:  (!) 132/49 (!) 132/48 (!) 157/50  Pulse: 69 64 66 70  Resp:  16 15 20   Temp:  99 F (37.2 C) 98.6 F (37 C) 98.4 F (36.9 C)  TempSrc:  Oral Oral Oral  SpO2:  95% 94% 94%  Weight:   94.7 kg (208 lb 12.4 oz)   Height:        Intake/Output Summary (Last 24 hours) at 03/19/2018 1119 Last data filed at 03/18/2018 1700 Gross per 24 hour  Intake 480 ml  Output -  Net 480 ml   Filed Weights   03/17/18 0425 03/17/18 1047 03/19/18 0419  Weight: 93.1 kg (205 lb 4 oz) 91.2 kg (201 lb 1 oz) 94.7 kg (208 lb 12.4 oz)    Examination:  General exam: NAD Respiratory system: Some bibasilar crackles.  No wheezing.  No rhonchi.  Respiratory effort normal. Cardiovascular system: RRR with a 3 out of 6 systolic ejection murmur.  No JVD.  Left lower extremity with some ecchymosis, some erythema, hinged brace, some swelling.   Gastrointestinal system: Abdomen is nontender, nondistended, soft, positive bowel sounds.  No rebound.  No guarding.  Central nervous system: Alert and oriented. No focal neurological deficits. Extremities: Left lower extremity with some ecchymosis and some erythema, some swelling, hinged brace.  Symmetric 5 x 5 power. Skin: No rashes, lesions or ulcers Psychiatry: Judgement and insight appear normal. Mood & affect appropriate.     Data Reviewed: I have personally reviewed following labs and imaging studies  CBC: Recent Labs  Lab 03/14/18 0403 03/15/18 0343 03/16/18 0435 03/17/18 0712 03/19/18 0509  WBC 7.6 7.2 9.3 9.1 7.5  HGB 11.4* 10.6* 11.1* 10.9* 10.0*    HCT 36.7 33.3* 34.2* 33.9* 30.3*  MCV 99.2 96.2 95.8 96.3 94.1  PLT 149* 159 172 179 175   Basic Metabolic Panel: Recent Labs  Lab 03/17/18 2200  03/18/18 0413 03/18/18 0939 03/18/18 1230 03/18/18 1702 03/19/18 0509  NA 132*   < > 133* 133* 133* 132* 129*  K 2.9*   < > 3.6 3.3* 3.6 3.8 3.4*  CL 95*   < > 97* 99* 98* 96* 99*  CO2 23   < > 21* 22 22 22  21*  GLUCOSE 246*   < > 169* 190* 154* 157* 272*  BUN 65*   < > 65* 66* 67* 65* 58*  CREATININE 2.65*   < > 2.61* 2.46* 2.32* 2.34* 1.99*  CALCIUM 8.3*   < > 8.0* 8.2* 8.4* 8.4* 7.7*  MG 1.9  --   --   --   --   --   --    < > = values in this interval not displayed.   GFR: Estimated Creatinine Clearance: 34.6 mL/min (  A) (by C-G formula based on SCr of 1.99 mg/dL (H)). Liver Function Tests: No results for input(s): AST, ALT, ALKPHOS, BILITOT, PROT, ALBUMIN in the last 168 hours. No results for input(s): LIPASE, AMYLASE in the last 168 hours. No results for input(s): AMMONIA in the last 168 hours. Coagulation Profile: No results for input(s): INR, PROTIME in the last 168 hours. Cardiac Enzymes: No results for input(s): CKTOTAL, CKMB, CKMBINDEX, TROPONINI in the last 168 hours. BNP (last 3 results) No results for input(s): PROBNP in the last 8760 hours. HbA1C: No results for input(s): HGBA1C in the last 72 hours. CBG: Recent Labs  Lab 03/18/18 1611 03/18/18 2044 03/19/18 0020 03/19/18 0416 03/19/18 0815  GLUCAP 114* 155* 93 129* 162*   Lipid Profile: No results for input(s): CHOL, HDL, LDLCALC, TRIG, CHOLHDL, LDLDIRECT in the last 72 hours. Thyroid Function Tests: No results for input(s): TSH, T4TOTAL, FREET4, T3FREE, THYROIDAB in the last 72 hours. Anemia Panel: No results for input(s): VITAMINB12, FOLATE, FERRITIN, TIBC, IRON, RETICCTPCT in the last 72 hours. Sepsis Labs: No results for input(s): PROCALCITON, LATICACIDVEN in the last 168 hours.  Recent Results (from the past 240 hour(s))  MRSA PCR Screening      Status: None   Collection Time: 03/11/18  6:21 PM  Result Value Ref Range Status   MRSA by PCR NEGATIVE NEGATIVE Final    Comment:        The GeneXpert MRSA Assay (FDA approved for NASAL specimens only), is one component of a comprehensive MRSA colonization surveillance program. It is not intended to diagnose MRSA infection nor to guide or monitor treatment for MRSA infections. Performed at [facility], [address] [place], [place] [address]   Culture, blood (routine x 2)     Status: None (Preliminary result)   Collection Time: 03/14/18  9:10 PM  Result Value Ref Range Status   Specimen Description BLOOD RIGHT ARM  Final   Special Requests   Final    BOTTLES DRAWN AEROBIC ONLY Blood Culture adequate volume   Culture   Final    NO GROWTH 3 DAYS Performed at [facility], [address] [place], [place] [address]    Report Status PENDING  Incomplete  Culture, blood (routine x 2)     Status: None (Preliminary result)   Collection Time: 03/14/18  9:15 PM  Result Value Ref Range Status   Specimen Description BLOOD RIGHT ARM  Final   Special Requests   Final    BOTTLES DRAWN AEROBIC ONLY Blood Culture adequate volume   Culture   Final    NO GROWTH 3 DAYS Performed at [facility], [address] [place], [place] [address]    Report Status PENDING  Incomplete         Radiology Studies: No results found.      Scheduled Meds: . amLODipine  10 mg Oral Daily  . doxazosin  4 mg Oral QPM  . ferrous sulfate  325 mg Oral Daily  . heparin injection (subcutaneous)  5,000 Units Subcutaneous Q8H  . hydrALAZINE  100 mg Oral Q8H  . insulin pump   Subcutaneous TID AC & HS  . isosorbide mononitrate  60 mg Oral BID  . metoprolol tartrate  50 mg Oral BID  . mycophenolate  360 mg Oral Daily   And  . mycophenolate  180 mg Oral QPM  . polyethylene glycol  34 g Oral Once  . potassium chloride  20 mEq Oral Once  .  pravastatin  40 mg Oral QHS  . predniSONE   5 mg Oral QAC breakfast  . sulfamethoxazole-trimethoprim  1 tablet Oral Q M,W,F  . tacrolimus  2 mg Oral BID  . valGANciclovir  450 mg Oral Q1200   Continuous Infusions:    LOS: 8 days    Time spent: 40 minutes    Ramiro Harvest, MD [facility] Pager [phone]  If 7PM-7AM, please contact night-coverage [email or web address] Password [email or web address] 03/19/2018, 11:19 AM

## 2018-03-20 DIAGNOSIS — L8990 Pressure ulcer of unspecified site, unspecified stage: Secondary | ICD-10-CM

## 2018-03-20 LAB — BASIC METABOLIC PANEL
ANION GAP: 8 (ref 5–15)
BUN: 49 mg/dL — AB (ref 6–20)
CO2: 24 mmol/L (ref 22–32)
Calcium: 8.1 mg/dL — ABNORMAL LOW (ref 8.9–10.3)
Chloride: 100 mmol/L — ABNORMAL LOW (ref 101–111)
Creatinine, Ser: 1.71 mg/dL — ABNORMAL HIGH (ref 0.44–1.00)
GFR, EST AFRICAN AMERICAN: 36 mL/min — AB (ref >60)
GFR, EST NON AFRICAN AMERICAN: 31 mL/min — AB (ref >60)
Glucose, Bld: 105 mg/dL — ABNORMAL HIGH (ref 65–99)
POTASSIUM: 3.2 mmol/L — AB (ref 3.5–5.1)
SODIUM: 132 mmol/L — AB (ref 135–145)

## 2018-03-20 LAB — CBC
HEMATOCRIT: 30.5 % — AB (ref 36.0–46.0)
Hemoglobin: 10 g/dL — ABNORMAL LOW (ref 12.0–15.0)
MCH: 30.8 pg (ref 26.0–34.0)
MCHC: 32.8 g/dL (ref 30.0–36.0)
MCV: 93.8 fL (ref 78.0–100.0)
Platelets: 191 10*3/uL (ref 150–400)
RBC: 3.25 MIL/uL — AB (ref 3.87–5.11)
RDW: 13.9 % (ref 11.5–15.5)
WBC: 5.8 10*3/uL (ref 4.0–10.5)

## 2018-03-20 LAB — CULTURE, BLOOD (ROUTINE X 2)
CULTURE: NO GROWTH
Culture: NO GROWTH
SPECIAL REQUESTS: ADEQUATE
Special Requests: ADEQUATE

## 2018-03-20 LAB — GLUCOSE, CAPILLARY
GLUCOSE-CAPILLARY: 92 mg/dL (ref 65–99)
GLUCOSE-CAPILLARY: 137 mg/dL — AB (ref 65–99)
Glucose-Capillary: 146 mg/dL — ABNORMAL HIGH (ref 65–99)
Glucose-Capillary: 104 mg/dL — ABNORMAL HIGH (ref 65–99)

## 2018-03-20 NOTE — Progress Notes (Addendum)
PROGRESS NOTE    Hannah Valentine  NFA:[id number] DOB: 1956/10/01 DOA: 03/11/2018 PCP: [name], PA   Brief Narrative:  Hannah Valentine Hannah pleasant 62 Valentine medical history significant for HTN,CKD stage III, the patient has a history of kidney transplant in 2017, hyperlipidemia, diabetes on insulin pump, presents with mechanical fall, sustained a tibial fracture, status post surgical repair by Dr. [name] , patient continues to have intermittent fever infectious work-up remains negative. ID consulted.  Pancultured.  CMV positive.  EBV titers negative.  Patient started on valganciclovir..     Assessment & Plan:   Principal Problem:   Left tibial fracture Active Problems:   Fall   History of kidney transplant   Essential hypertension   HLD (hyperlipidemia)   CKD (chronic kidney disease), stage III (HCC)   DM (diabetes mellitus), type 1 (HCC)   Closed fracture of upper end of left fibula   Trauma   FUO (fever of unknown origin)   Hypertensive crisis   Prediabetes   Acute blood loss anemia   Fever  Left tibial fracture;  -Secondary to mechanical fall, S/P ORIF of left tibial shaft fracture.  - Management per ORTHO.  -Followed by PT, CIR consulted and following..  DM type 1;Early DKA -Patient was on insulin pump, she is quite comfortable with using the pump, DM coordinator helping with the pump, she reports last A1c was 6.4. CBGs noted to be elevated in the 300s on 03/17/2018.  Urinalysis which was done unremarkable.  Beta hydroxybutyrate was elevated.  Patient was noted to have an anion gap as high as 21 and noted to be acidotic on 03/17/2018.  Patient subsequently transitioned to the stepdown unit and placed on the insulin drip.  Anion gap closed.  Acidosis resolved.  She has been transitioned back to her insulin pump.  CBGs ranging from 92-146.  IV fluids have been discontinued.  Follow.   Acute on CKD stage III - status post renal transplant 2017, on oral  immunosuppressant therapy. Baseline getting around 1.7-1.9. Renal function to trend back down and currently at 1.71 from 1.99 from 2.34 from 2.32 from 2.46.  Lasix has been discontinued per nephrology.  Saline lock IV fluids.  Will defer resumption of diuretics to nephrology.  Continue current immunosuppressive treatments with Prograf, prednisone.  Continue Bactrim.  Avoid nephrotoxins.  Per nephrology.    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  Fever curve trending down.  Chest x-ray negative.  Blood cultures with no growth to date.  Patient with fever curve trending down.  CMV DNA PCR urine positive.  CMV DNA PCR serum positive.  EBV DNA PCR titers negative.  Patient on Bactrim for prophylaxis Lower extremity Dopplers were done were negative for DVT.  Patient has been reassessed by orthopedics who does not feel infection is coming from recent surgery. Patient started on valganciclovir per ID for CMV infection.  Will need valganciclovir x21 days per ID and will need weekly CBC and cmet while on treatment to follow renal function and white count.  Patient will need to follow-up with her transplant nephrologist for his CMV infection.    HTN;  -They will.  Continue Norvasc, Cardura, hydralazine, imdur, Lopressor.  Hyponatremia Likely secondary to hypervolemic hyponatremia.  Hyponatremia improving.  IV fluids have been saline locked.  Continue to hold oral Lasix and resume when okay with nephrology.  Follow.    Hypokalemia Replete.    DVT prophylaxis: Heparin Code Status: Full Family Communication: Updated patient  husband at bedside.   Disposition Plan: CIR hopefully on Monday, 03/22/2018 if okay with nephrology.     Consultants:   Orthopedics: Dr. [name] 03/12/2018  Nephrology: Dr. Rae LipsScherttz 03/14/2018  CIR: Dr Allena KatzPatel 03/15/2018  Infectious disease: Dr. Luciana Axeomer 03/16/2018  Procedures:   CT left knee 03/11/2018  Chest x-ray 03/15/2018   plain films of the  left tib-fib 03/12/2018, 03/11/2018  Lower extremity Dopplers 03/16/2018  ORIF left proximal tibial fracture per Dr. [name] 03/12/2018  Antimicrobials:   Prophylactic Bactrim 03/12/2018  ValGanciclovir 03/18/2018 x 21 days   Subjective: Patient in bed.  States feeling better.  No chest pain.  No shortness of breath.  No abdominal pain.  Tolerating current diet.    Objective: Vitals:   03/19/18 1929 03/19/18 2131 03/20/18 0524 03/20/18 0811  BP: 92/77 (!) 150/57 (!) 156/58 (!) 144/53  Pulse:      Resp: 17 15 18 11   Temp: 98.7 F (37.1 C)  (!) 94.4 F (34.7 C) (!) 97.1 F (36.2 C)  TempSrc: Oral  Oral Oral  SpO2: 96%  96% 97%  Weight:   94.4 kg (208 lb 1.8 oz)   Height:        Intake/Output Summary (Last 24 hours) at 03/20/2018 1332 Last data filed at 03/20/2018 0500 Gross per 24 hour  Intake 720 ml  Output 400 ml  Net 320 ml   Filed Weights   03/17/18 1047 03/19/18 0419 03/20/18 0524  Weight: 91.2 kg (201 lb 1 oz) 94.7 kg (208 lb 12.4 oz) 94.4 kg (208 lb 1.8 oz)    Examination:  General exam: NAD Respiratory system: Lungs clear to auscultation bilaterally.  No wheezes, no crackles, no rhonchi.  Normal respiratory effort.  Cardiovascular system: Regular rate and rhythm with a 3/6 SEM.  No JVD.  Left lower extremity with some ecchymosis, some erythema, less swelling, tender to palpation. Gastrointestinal system: Abdomen is soft, nontender, nondistended, positive bowel sounds.  No rebound.  No guarding. Central nervous system: Alert and oriented. No focal neurological deficits. Extremities: Left lower extremity with some ecchymosis and some erythema, less swelling, TTP.   Skin: No rashes, lesions or ulcers Psychiatry: Judgement and insight appear normal. Mood & affect appropriate.     Data Reviewed: I have personally reviewed following labs and imaging studies  CBC: Recent Labs  Lab 03/15/18 0343 03/16/18 0435 03/17/18 0712 03/19/18 0509 03/20/18 0202  WBC 7.2 9.3  9.1 7.5 5.8  HGB 10.6* 11.1* 10.9* 10.0* 10.0*  HCT 33.3* 34.2* 33.9* 30.3* 30.5*  MCV 96.2 95.8 96.3 94.1 93.8  PLT 159 172 179 175 191   Basic Metabolic Panel: Recent Labs  Lab 03/17/18 2200  03/18/18 0939 03/18/18 1230 03/18/18 1702 03/19/18 0509 03/20/18 0202  NA 132*   < > 133* 133* 132* 129* 132*  K 2.9*   < > 3.3* 3.6 3.8 3.4* 3.2*  CL 95*   < > 99* 98* 96* 99* 100*  CO2 23   < > 22 22 22  21* 24  GLUCOSE 246*   < > 190* 154* 157* 272* 105*  BUN 65*   < > 66* 67* 65* 58* 49*  CREATININE 2.65*   < > 2.46* 2.32* 2.34* 1.99* 1.71*  CALCIUM 8.3*   < > 8.2* 8.4* 8.4* 7.7* 8.1*  MG 1.9  --   --   --   --   --   --    < > = values in this interval not displayed.   GFR: Estimated  Creatinine Clearance: 40.2 mL/min (A) (by C-G formula based on SCr of 1.71 mg/dL (H)). Liver Function Tests: No results for input(s): AST, ALT, ALKPHOS, BILITOT, PROT, ALBUMIN in the last 168 hours. No results for input(s): LIPASE, AMYLASE in the last 168 hours. No results for input(s): AMMONIA in the last 168 hours. Coagulation Profile: No results for input(s): INR, PROTIME in the last 168 hours. Cardiac Enzymes: No results for input(s): CKTOTAL, CKMB, CKMBINDEX, TROPONINI in the last 168 hours. BNP (last 3 results) No results for input(s): PROBNP in the last 8760 hours. HbA1C: No results for input(s): HGBA1C in the last 72 hours. CBG: Recent Labs  Lab 03/19/18 1148 03/19/18 1627 03/19/18 2120 03/20/18 0618 03/20/18 1121  GLUCAP 115* 93 126* 146* 92   Lipid Profile: No results for input(s): CHOL, HDL, LDLCALC, TRIG, CHOLHDL, LDLDIRECT in the last 72 hours. Thyroid Function Tests: No results for input(s): TSH, T4TOTAL, FREET4, T3FREE, THYROIDAB in the last 72 hours. Anemia Panel: No results for input(s): VITAMINB12, FOLATE, FERRITIN, TIBC, IRON, RETICCTPCT in the last 72 hours. Sepsis Labs: No results for input(s): PROCALCITON, LATICACIDVEN in the last 168 hours.  Recent Results (from  the past 240 hour(s))  MRSA PCR Screening     Status: None   Collection Time: 03/11/18  6:21 PM  Result Value Ref Range Status   MRSA by PCR NEGATIVE NEGATIVE Final    Comment:        The GeneXpert MRSA Assay (FDA approved for NASAL specimens only), is one component of a comprehensive MRSA colonization surveillance program. It is not intended to diagnose MRSA infection nor to guide or monitor treatment for MRSA infections. Performed at [facility], [address], [place], [place] [address]   Culture, blood (routine x 2)     Status: None   Collection Time: 03/14/18  9:10 PM  Result Value Ref Range Status   Specimen Description BLOOD RIGHT ARM  Final   Special Requests   Final    BOTTLES DRAWN AEROBIC ONLY Blood Culture adequate volume   Culture   Final    NO GROWTH 5 DAYS Performed at [facility], [address] [place], [place] [address]    Report Status 03/20/2018 FINAL  Final  Culture, blood (routine x 2)     Status: None   Collection Time: 03/14/18  9:15 PM  Result Value Ref Range Status   Specimen Description BLOOD RIGHT ARM  Final   Special Requests   Final    BOTTLES DRAWN AEROBIC ONLY Blood Culture adequate volume   Culture   Final    NO GROWTH 5 DAYS Performed at [facility], [address] [place], [place] [address]    Report Status 03/20/2018 FINAL  Final         Radiology Studies: No results found.      Scheduled Meds: . amLODipine  10 mg Oral Daily  . doxazosin  4 mg Oral QPM  . ferrous sulfate  325 mg Oral Daily  . heparin injection (subcutaneous)  5,000 Units Subcutaneous Q8H  . hydrALAZINE  100 mg Oral Q8H  . insulin pump   Subcutaneous TID AC & HS  . isosorbide mononitrate  60 mg Oral BID  . metoprolol tartrate  50 mg Oral BID  . mycophenolate  180 mg Oral BID  . polyethylene glycol  34 g Oral Once  . pravastatin  40 mg Oral QHS  . predniSONE  5 mg Oral QAC breakfast  .  sulfamethoxazole-trimethoprim  1 tablet  Oral Q M,W,F  . tacrolimus  2 mg Oral BID  . valGANciclovir  450 mg Oral Q1200   Continuous Infusions:    LOS: 9 days    Time spent: 40 minutes    Ramiro Harvest, MD [facility] Pager [phone]  If 7PM-7AM, please contact night-coverage [email or web address] Password [email or web address] 03/20/2018, 1:32 PM

## 2018-03-20 NOTE — Progress Notes (Signed)
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 amLODipine  10 mg Oral Daily  . doxazosin  4 mg Oral QPM  . ferrous sulfate  325 mg Oral Daily  . heparin injection (subcutaneous)  5,000 Units Subcutaneous Q8H  . hydrALAZINE  100 mg Oral Q8H  . insulin pump   Subcutaneous TID AC & HS  . isosorbide mononitrate  60 mg Oral BID  . metoprolol tartrate  50 mg Oral BID  . mycophenolate  180 mg Oral BID  . polyethylene glycol  34 g Oral Once  . pravastatin  40 mg Oral QHS  . predniSONE  5 mg Oral QAC breakfast  . sulfamethoxazole-trimethoprim  1 tablet Oral Q M,W,F  . tacrolimus  2 mg Oral BID  . valGANciclovir  450 mg Oral Q1200   Continuous Infusions: PRN Meds:.acetaminophen, bisacodyl, hydrALAZINE, ondansetron (ZOFRAN) IV, oxyCODONE, polyethylene glycol  Current Labs: reviewed  Results for Hannah Valentine, Hannah Valentine (MRN [id number]) as of 03/19/2018 14:19  Ref. Range 03/16/2018 11:49  CMV Quant DNA PCR (Plasma) Latest Ref Range: Negative IU/mL Positive < 200    Physical Exam:  Blood pressure (!) 156/58, pulse 67, temperature (!) 94.4 F (34.7 C), temperature source Oral, resp. rate 18, height 5\' 7"  (1.702 m), weight 94.4 kg (208 lb 1.8 oz), SpO2 96 %. NAD NCAT EOMI No rashes/icterus CTAB S/nt/nd RRR nl s1s2 has contin soft murmur from AVF LUE AVF +B/T L leg braced  A 1. AoCKD (BL SCr 1.6-1.9) s/p KT 2017 [facility]; resolving 2. HTN norvasc, cardura, hydralazine, MTP 3. IS on Tac/MMF/Pred/ TIW DS TMP/SMX 4. 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 S/p Tibial Shift Fx and ORIF 6. DM2 7. Deconditioning likely to CIR  P 1. Continue reduced MMF at 180 BID; would not change dose until followed by transplant nephrology 2. Continue to hold home furosemide (80mg  qDay); reassess tomorrow 3. Daily weights, Daily Renal Panel, Strict I/Os, Avoid nephrotoxins (NSAIDs, judicious IV Contrast)    Sabra Heckyan Elya Tarquinio MD 03/20/2018, 8:08 AM  Recent Labs  Lab 03/18/18 1702 03/19/18 0509 03/20/18 0202  NA 132* 129* 132*  K 3.8 3.4* 3.2*  CL 96* 99* 100*  CO2 22 21* 24  GLUCOSE 157* 272* 105*  BUN 65* 58* 49*  CREATININE 2.34* 1.99* 1.71*  CALCIUM 8.4* 7.7* 8.1*   Recent Labs  Lab 03/17/18 0712 03/19/18 0509 03/20/18 0202  WBC 9.1 7.5 5.8  HGB 10.9* 10.0* 10.0*  HCT 33.9* 30.3* 30.5*  MCV 96.3 94.1 93.8  PLT 179 175 191

## 2018-03-20 NOTE — Plan of Care (Signed)
progressing 

## 2018-03-21 LAB — RENAL FUNCTION PANEL
ANION GAP: 10 (ref 5–15)
Albumin: 2.5 g/dL — ABNORMAL LOW (ref 3.5–5.0)
BUN: 43 mg/dL — AB (ref 6–20)
CHLORIDE: 101 mmol/L (ref 101–111)
CO2: 24 mmol/L (ref 22–32)
Calcium: 8.3 mg/dL — ABNORMAL LOW (ref 8.9–10.3)
Creatinine, Ser: 1.59 mg/dL — ABNORMAL HIGH (ref 0.44–1.00)
GFR, EST AFRICAN AMERICAN: 39 mL/min — AB (ref >60)
GFR, EST NON AFRICAN AMERICAN: 34 mL/min — AB (ref >60)
Glucose, Bld: 111 mg/dL — ABNORMAL HIGH (ref 65–99)
PHOSPHORUS: 3.1 mg/dL (ref 2.5–4.6)
POTASSIUM: 3.5 mmol/L (ref 3.5–5.1)
Sodium: 135 mmol/L (ref 135–145)

## 2018-03-21 LAB — GLUCOSE, CAPILLARY
GLUCOSE-CAPILLARY: 134 mg/dL — AB (ref 65–99)
GLUCOSE-CAPILLARY: 59 mg/dL — AB (ref 65–99)
GLUCOSE-CAPILLARY: 93 mg/dL (ref 65–99)
GLUCOSE-CAPILLARY: 52 mg/dL — AB (ref 65–99)
Glucose-Capillary: 98 mg/dL (ref 65–99)

## 2018-03-21 MED ORDER — VALGANCICLOVIR HCL 450 MG PO TABS
450.0000 mg | ORAL_TABLET | Freq: Two times a day (BID) | ORAL | Status: DC
  Administered 2018-03-21 – 2018-03-22 (×2): 450 mg via ORAL
  Filled 2018-03-21 (×4): qty 1

## 2018-03-21 NOTE — Progress Notes (Signed)
Pt arrived to 5N22. Call bell within reach. Resting in chair comfortably.

## 2018-03-21 NOTE — Plan of Care (Signed)
  Problem: Education: Goal: Knowledge of General Education information will improve Outcome: Progressing   Problem: Health Behavior/Discharge Planning: Goal: Ability to manage health-related needs will improve Outcome: Progressing   Problem: Clinical Measurements: Goal: Ability to maintain clinical measurements within normal limits will improve Outcome: Progressing Goal: Will remain free from infection Outcome: Progressing Goal: Diagnostic test results will improve Outcome: Progressing Goal: Respiratory complications will improve Outcome: Progressing Goal: Cardiovascular complication will be avoided Outcome: Progressing   Problem: Activity: Goal: Risk for activity intolerance will decrease Outcome: Progressing   Problem: Coping: Goal: Level of anxiety will decrease Outcome: Progressing   Problem: Pain Managment: Goal: General experience of comfort will improve Outcome: Progressing   Problem: Skin Integrity: Goal: Risk for impaired skin integrity will decrease Outcome: Progressing

## 2018-03-21 NOTE — Progress Notes (Signed)
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 amLODipine  10 mg Oral Daily  . doxazosin  4 mg Oral QPM  . ferrous sulfate  325 mg Oral Daily  . heparin injection (subcutaneous)  5,000 Units Subcutaneous Q8H  . hydrALAZINE  100 mg Oral Q8H  . insulin pump   Subcutaneous TID AC & HS  . isosorbide mononitrate  60 mg Oral BID  . metoprolol tartrate  50 mg Oral BID  . mycophenolate  180 mg Oral BID  . polyethylene glycol  34 g Oral Once  . pravastatin  40 mg Oral QHS  . predniSONE  5 mg Oral QAC breakfast  . sulfamethoxazole-trimethoprim  1 tablet Oral Q M,W,F  . tacrolimus  2 mg Oral BID  . valGANciclovir  450 mg Oral Q1200   Continuous Infusions: PRN Meds:.acetaminophen, bisacodyl, hydrALAZINE, ondansetron (ZOFRAN) IV, oxyCODONE, polyethylene glycol  Current Labs: reviewed  Results for Hannah Valentine, Hannah (MRN [id number]) as of 03/19/2018 14:19  Ref. Range 03/16/2018 11:49  CMV Quant DNA PCR (Plasma) Latest Ref Range: Negative IU/mL Positive < 200    Physical Exam:  Blood pressure (!) 149/54, pulse 67, temperature 98.6 F (37 C), temperature source Oral, resp. rate 19, height 5\' 7"  (1.702 m), weight 95 kg (209 lb 7 oz), SpO2 98 %. NAD NCAT EOMI No rashes/icterus CTAB S/nt/nd RRR nl s1s2 has contin soft murmur from AVF LUE AVF +B/T L leg braced  A 1. AoCKD (BL SCr 1.6-1.9) s/p KT 2017 [facility]; resolved 2. HTN norvasc, cardura, hydralazine, MTP; holding lasix 80mg  qAM for now 3. IS on  Tac/MMF/Pred/ TIW DS TMP/SMX; MMF dosing reduced with CMV viremia from 360 mg in the morning/180 mg in the evening to 180 mg twice daily 4. CMV Viremia, low level; RCID saw; now on Valcyte 5. S/p Tibial Shift Fx and ORIF 6. DM2 7. Deconditioning likely to CIR  P 1. Continue reduced MMF at 180 BID; would not change dose until followed by transplant nephrology 2. With adequate urine output and stable weights see no reason to immediately resume furosemide; should she develop peripheral edema can certainly be used moving forward 3. She will need to closely [facility] upon discharge from CIR 4. Daily weights, Daily Renal Panel, Strict I/Os, Avoid nephrotoxins (NSAIDs, judicious IV Contrast)  5. Will sign off for now.  Please call with any questions or concerns.  Pt will need follow [facility]   Adra Shepler MD 03/21/2018, 7:51 AM  Recent Labs  Lab 03/19/18 0509 03/20/18 0202 03/21/18 0243  NA 129* 132* 135  K 3.4* 3.2* 3.5  CL 99* 100* 101  CO2 21* 24 24  GLUCOSE 272* 105* 111*  BUN 58* 49* 43*  CREATININE 1.99* 1.71* 1.59*  CALCIUM 7.7* 8.1* 8.3*  PHOS  --   --  3.1   Recent Labs  Lab 03/17/18 0712 03/19/18 0509 03/20/18 0202  WBC 9.1 7.5 5.8  HGB 10.9* 10.0* 10.0*  HCT 33.9* 30.3* 30.5*  MCV 96.3 94.1 93.8  PLT 179 175 191

## 2018-03-21 NOTE — Progress Notes (Signed)
PHARMACY NOTE:  ANTIMICROBIAL RENAL DOSAGE ADJUSTMENT  Current antimicrobial regimen includes a mismatch between antimicrobial dosage and estimated renal function.  As per policy approved by the Pharmacy & Therapeutics and Medical Executive Committees, the antimicrobial dosage will be adjusted accordingly.  Current antimicrobial dosage: valganciclovir 450mg  PO daily  Indication: CMV  Renal Function:  Estimated Creatinine Clearance: 43.4 mL/min (A) (by C-G formula based on SCr of 1.59 mg/dL (H)). []      On intermittent HD, scheduled: []      On CRRT    Antimicrobial dosage has been changed to: valganciclovir 450mg  PO BID  Thank you for allowing pharmacy to be a part of this patient's care.  Roderic ScarceErin N. Zigmund Danieleja, PharmD PGY1 Pharmacy Resident Pager: [phone] 03/21/2018 9:24 AM

## 2018-03-21 NOTE — Progress Notes (Signed)
PROGRESS NOTE    Hannah GellDonna Valentine  ZOX:[id number] DOB: 09/22/1956 DOA: 03/11/2018 PCP: Shellia CleverlyBeane, Lori M, PA   Brief Narrative:  Hannah GarretDonna Lerougeis avery pleasant 62 y.o.femalewith medical history significant for HTN,CKD stage III, the patient has a history of kidney transplant in 2017, hyperlipidemia, diabetes on insulin pump, presents with mechanical fall, sustained a tibial fracture, status post surgical repair by Dr. [name] , patient continues to have intermittent fever infectious work-up remains negative. ID consulted.  Pancultured.  CMV positive.  EBV titers negative.  Patient started on valganciclovir..     Assessment & Plan:   Principal Problem:   Left tibial fracture Active Problems:   Fall   History of kidney transplant   Essential hypertension   HLD (hyperlipidemia)   CKD (chronic kidney disease), stage III (HCC)   DM (diabetes mellitus), type 1 (HCC)   Closed fracture of upper end of left fibula   Trauma   FUO (fever of unknown origin)   Hypertensive crisis   Prediabetes   Acute blood loss anemia   Fever   Pressure injury of skin  Left tibial fracture;  -Secondary to mechanical fall, S/P ORIF of left tibial shaft fracture.  - Management per ORTHO.  -Followed by PT, CIR consulted and following.  Patient stable for transfer to CIR when bed available.  Patient will need outpatient follow-up with orthopedics..  DM type 1;Early DKA -Patient was on insulin pump, she is quite comfortable with using the pump, DM coordinator helping with the pump, she reports last A1c was 6.4. CBGs noted to be elevated in the 300s on 03/17/2018.  Urinalysis which was done unremarkable.  Beta hydroxybutyrate was elevated.  Patient was noted to have an anion gap as high as 21 and noted to be acidotic on 03/17/2018.  Patient subsequently transitioned to the stepdown unit and placed on the insulin drip.  Anion gap closed.  Acidosis resolved.  She has been transitioned back to her insulin pump.  CBGs  ranging from 92-146.  IV fluids have been discontinued.  Follow.   Acute on CKD stage III - status post renal transplant 2017, on oral immunosuppressant therapy. Baseline getting around 1.7-1.9. Renal function to trend back down and currently at 1.59 from 1.71 from 1.99 from 2.34 from 2.32 from 2.46.  Lasix has been discontinued per nephrology.  Urine output of 1.8 L over the past 24 hours.  Continue to hold diuretics for now.  Per nephrology if patient develops peripheral edema may resume diuretics. Continue current immunosuppressive treatments with Prograf, prednisone.  Continue reduced MMF at 180 mg twice daily per nephrology until patient follows up with her transplant nephrologist.  Continue Bactrim.  Avoid nephrotoxins.  Per nephrology.    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  Fever curve trended down.  Afebrile.  Chest x-ray negative.  Blood cultures with no growth to date.  Patient with fever curve trending down.  CMV DNA PCR urine positive.  CMV DNA PCR serum positive.  EBV DNA PCR titers negative.  Patient on Bactrim for prophylaxis Lower extremity Dopplers negative for DVT.  Patient has been reassessed by orthopedics who does not feel infection is coming from recent surgery. Patient started on valganciclovir per ID for CMV infection.  Will need valganciclovir x21 days per ID and will need weekly CBC and cmet while on treatment to follow renal function and white count.  Patient will need to follow-up with her transplant nephrologist for CMV infection.    HTN;  -Stable.  Continue current regimen of Norvasc, Cardura, hydralazine, imdur, Lopressor.    Hyponatremia Likely secondary to hypervolemic hyponatremia.  Improving daily.  IV fluids have been saline locked.  Patient with a urine output of 1.8 L over the past 24 hours.  Diuresing well.  Continue to hold oral Lasix for now and if patient develops peripheral edema could be resumed per nephrology  recommendations. Follow.    Hypokalemia Repleted.    DVT prophylaxis: Heparin Code Status: Full Family Communication: Updated patient.  No family at bedside.  Disposition Plan: CIR when bed available, hopefully on Monday, 03/22/2018.     Consultants:   Orthopedics: Dr. [name] 03/12/2018  Nephrology: Dr. Rae Lips 03/14/2018  CIR: Dr Allena Katz 03/15/2018  Infectious disease: Dr. Luciana Axe 03/16/2018  Procedures:   CT left knee 03/11/2018  Chest x-ray 03/15/2018   plain films of the left tib-fib 03/12/2018, 03/11/2018  Lower extremity Dopplers 03/16/2018  ORIF left proximal tibial fracture per Dr. [name] 03/12/2018  Antimicrobials:   Prophylactic Bactrim 03/12/2018  ValGanciclovir 03/18/2018 x 21 days   Subjective: Patient sitting up in bed.  Feeling better.  Denies any chest pain.  Denies any shortness of breath.  No abdominal pain.  Tolerating diet.   Objective: Vitals:   03/20/18 1350 03/20/18 1556 03/20/18 2032 03/21/18 0550  BP: 130/67 (!) 141/59 (!) 153/83 (!) 149/54  Pulse:      Resp:  14 14 19   Temp:  97.7 F (36.5 C) 98.6 F (37 C) 98.6 F (37 C)  TempSrc:  Oral Oral Oral  SpO2:   96% 98%  Weight:    95 kg (209 lb 7 oz)  Height:        Intake/Output Summary (Last 24 hours) at 03/21/2018 0919 Last data filed at 03/21/2018 0500 Gross per 24 hour  Intake 360 ml  Output 1800 ml  Net -1440 ml   Filed Weights   03/19/18 0419 03/20/18 0524 03/21/18 0550  Weight: 94.7 kg (208 lb 12.4 oz) 94.4 kg (208 lb 1.8 oz) 95 kg (209 lb 7 oz)    Examination:  General exam: NAD Respiratory system: CTAB.  No wheezes, no crackles, no rhonchi.  Normal respiratory effort. Cardiovascular system: RRR with a 3/6 SEM.  No JVD.  Left lower extremity with some ecchymosis, erythema, less swelling, tender to palpation. Gastrointestinal system: Abdomen is nontender, nondistended, soft, positive bowel sounds.  No rebound.  No guarding.   Central nervous system: Alert and oriented. No focal  neurological deficits. Extremities: LLE with erythema, ecchymosis, decreased swelling, tender to palpation.   Skin: No rashes, lesions or ulcers Psychiatry: Judgement and insight appear normal. Mood & affect appropriate.     Data Reviewed: I have personally reviewed following labs and imaging studies  CBC: Recent Labs  Lab 03/15/18 0343 03/16/18 0435 03/17/18 0712 03/19/18 0509 03/20/18 0202  WBC 7.2 9.3 9.1 7.5 5.8  HGB 10.6* 11.1* 10.9* 10.0* 10.0*  HCT 33.3* 34.2* 33.9* 30.3* 30.5*  MCV 96.2 95.8 96.3 94.1 93.8  PLT 159 172 179 175 191   Basic Metabolic Panel: Recent Labs  Lab 03/17/18 2200  03/18/18 1230 03/18/18 1702 03/19/18 0509 03/20/18 0202 03/21/18 0243  NA 132*   < > 133* 132* 129* 132* 135  K 2.9*   < > 3.6 3.8 3.4* 3.2* 3.5  CL 95*   < > 98* 96* 99* 100* 101  CO2 23   < > 22 22 21* 24 24  GLUCOSE 246*   < > 154* 157* 272* 105*  111*  BUN 65*   < > 67* 65* 58* 49* 43*  CREATININE 2.65*   < > 2.32* 2.34* 1.99* 1.71* 1.59*  CALCIUM 8.3*   < > 8.4* 8.4* 7.7* 8.1* 8.3*  MG 1.9  --   --   --   --   --   --   PHOS  --   --   --   --   --   --  3.1   < > = values in this interval not displayed.   GFR: Estimated Creatinine Clearance: 43.4 mL/min (A) (by C-G formula based on SCr of 1.59 mg/dL (H)). Liver Function Tests: Recent Labs  Lab 03/21/18 0243  ALBUMIN 2.5*   No results for input(s): LIPASE, AMYLASE in the last 168 hours. No results for input(s): AMMONIA in the last 168 hours. Coagulation Profile: No results for input(s): INR, PROTIME in the last 168 hours. Cardiac Enzymes: No results for input(s): CKTOTAL, CKMB, CKMBINDEX, TROPONINI in the last 168 hours. BNP (last 3 results) No results for input(s): PROBNP in the last 8760 hours. HbA1C: No results for input(s): HGBA1C in the last 72 hours. CBG: Recent Labs  Lab 03/20/18 0618 03/20/18 1121 03/20/18 1622 03/20/18 2115 03/21/18 0557  GLUCAP 146* 92 104* 137* 98   Lipid Profile: No  results for input(s): CHOL, HDL, LDLCALC, TRIG, CHOLHDL, LDLDIRECT in the last 72 hours. Thyroid Function Tests: No results for input(s): TSH, T4TOTAL, FREET4, T3FREE, THYROIDAB in the last 72 hours. Anemia Panel: No results for input(s): VITAMINB12, FOLATE, FERRITIN, TIBC, IRON, RETICCTPCT in the last 72 hours. Sepsis Labs: No results for input(s): PROCALCITON, LATICACIDVEN in the last 168 hours.  Recent Results (from the past 240 hour(s))  MRSA PCR Screening     Status: None   Collection Time: 03/11/18  6:21 PM  Result Value Ref Range Status   MRSA by PCR NEGATIVE NEGATIVE Final    Comment:        The GeneXpert MRSA Assay (FDA approved for NASAL specimens only), is one component of a comprehensive MRSA colonization surveillance program. It is not intended to diagnose MRSA infection nor to guide or monitor treatment for MRSA infections. Performed at [facility], [address], [place], [place] [address]   Culture, blood (routine x 2)     Status: None   Collection Time: 03/14/18  9:10 PM  Result Value Ref Range Status   Specimen Description BLOOD RIGHT ARM  Final   Special Requests   Final    BOTTLES DRAWN AEROBIC ONLY Blood Culture adequate volume   Culture   Final    NO GROWTH 5 DAYS Performed at [facility], [address] [place], [place] [address]    Report Status 03/20/2018 FINAL  Final  Culture, blood (routine x 2)     Status: None   Collection Time: 03/14/18  9:15 PM  Result Value Ref Range Status   Specimen Description BLOOD RIGHT ARM  Final   Special Requests   Final    BOTTLES DRAWN AEROBIC ONLY Blood Culture adequate volume   Culture   Final    NO GROWTH 5 DAYS Performed at [facility], [address], [place], [place] [address]    Report Status 03/20/2018 FINAL  Final         Radiology Studies: No results found.      Scheduled Meds: . amLODipine  10 mg Oral Daily  . doxazosin  4 mg Oral QPM  .  ferrous sulfate  325 mg  Oral Daily  . heparin injection (subcutaneous)  5,000 Units Subcutaneous Q8H  . hydrALAZINE  100 mg Oral Q8H  . insulin pump   Subcutaneous TID AC & HS  . isosorbide mononitrate  60 mg Oral BID  . metoprolol tartrate  50 mg Oral BID  . mycophenolate  180 mg Oral BID  . polyethylene glycol  34 g Oral Once  . pravastatin  40 mg Oral QHS  . predniSONE  5 mg Oral QAC breakfast  . sulfamethoxazole-trimethoprim  1 tablet Oral Q M,W,F  . tacrolimus  2 mg Oral BID  . valGANciclovir  450 mg Oral Q1200   Continuous Infusions:    LOS: 10 days    Time spent: 40 minutes    Ramiro Harvest, MD [facility] Pager [phone]  If 7PM-7AM, please contact night-coverage [email or web address] Password [facility] 03/21/2018, 9:19 AM

## 2018-03-22 ENCOUNTER — Encounter (HOSPITAL_COMMUNITY): Payer: Self-pay

## 2018-03-22 ENCOUNTER — Other Ambulatory Visit: Payer: Self-pay

## 2018-03-22 ENCOUNTER — Inpatient Hospital Stay (HOSPITAL_COMMUNITY)
Admission: RE | Admit: 2018-03-22 | Discharge: 2018-04-02 | DRG: 560 | Disposition: A | Discharge status: Home Health | Payer: Medicare Other | Source: Intra-hospital | Attending: Physical Medicine & Rehabilitation | Admitting: Physical Medicine & Rehabilitation

## 2018-03-22 DIAGNOSIS — E1122 Type 2 diabetes mellitus with diabetic chronic kidney disease: Secondary | ICD-10-CM | POA: Diagnosis present

## 2018-03-22 DIAGNOSIS — L03116 Cellulitis of left lower limb: Secondary | ICD-10-CM | POA: Diagnosis present

## 2018-03-22 DIAGNOSIS — E785 Hyperlipidemia, unspecified: Secondary | ICD-10-CM | POA: Diagnosis present

## 2018-03-22 DIAGNOSIS — E669 Obesity, unspecified: Secondary | ICD-10-CM

## 2018-03-22 DIAGNOSIS — Z7952 Long term (current) use of systemic steroids: Secondary | ICD-10-CM | POA: Diagnosis not present

## 2018-03-22 DIAGNOSIS — S82201S Unspecified fracture of shaft of right tibia, sequela: Secondary | ICD-10-CM

## 2018-03-22 DIAGNOSIS — Z9641 Presence of insulin pump (external) (internal): Secondary | ICD-10-CM | POA: Diagnosis present

## 2018-03-22 DIAGNOSIS — R5381 Other malaise: Secondary | ICD-10-CM | POA: Diagnosis not present

## 2018-03-22 DIAGNOSIS — M25531 Pain in right wrist: Secondary | ICD-10-CM | POA: Diagnosis not present

## 2018-03-22 DIAGNOSIS — Z801 Family history of malignant neoplasm of trachea, bronchus and lung: Secondary | ICD-10-CM | POA: Diagnosis not present

## 2018-03-22 DIAGNOSIS — Z803 Family history of malignant neoplasm of breast: Secondary | ICD-10-CM

## 2018-03-22 DIAGNOSIS — S82409A Unspecified fracture of shaft of unspecified fibula, initial encounter for closed fracture: Secondary | ICD-10-CM

## 2018-03-22 DIAGNOSIS — D638 Anemia in other chronic diseases classified elsewhere: Secondary | ICD-10-CM

## 2018-03-22 DIAGNOSIS — N183 Chronic kidney disease, stage 3 unspecified: Secondary | ICD-10-CM

## 2018-03-22 DIAGNOSIS — T8619 Other complication of kidney transplant: Secondary | ICD-10-CM | POA: Diagnosis present

## 2018-03-22 DIAGNOSIS — L89899 Pressure ulcer of other site, unspecified stage: Secondary | ICD-10-CM

## 2018-03-22 DIAGNOSIS — K5903 Drug induced constipation: Secondary | ICD-10-CM | POA: Diagnosis not present

## 2018-03-22 DIAGNOSIS — Z833 Family history of diabetes mellitus: Secondary | ICD-10-CM

## 2018-03-22 DIAGNOSIS — W19XXXD Unspecified fall, subsequent encounter: Secondary | ICD-10-CM | POA: Diagnosis present

## 2018-03-22 DIAGNOSIS — S82401D Unspecified fracture of shaft of right fibula, subsequent encounter for closed fracture with routine healing: Secondary | ICD-10-CM | POA: Diagnosis not present

## 2018-03-22 DIAGNOSIS — S82401S Unspecified fracture of shaft of right fibula, sequela: Secondary | ICD-10-CM

## 2018-03-22 DIAGNOSIS — S82102D Unspecified fracture of upper end of left tibia, subsequent encounter for closed fracture with routine healing: Secondary | ICD-10-CM | POA: Diagnosis present

## 2018-03-22 DIAGNOSIS — S82402D Unspecified fracture of shaft of left fibula, subsequent encounter for closed fracture with routine healing: Secondary | ICD-10-CM

## 2018-03-22 DIAGNOSIS — I129 Hypertensive chronic kidney disease with stage 1 through stage 4 chronic kidney disease, or unspecified chronic kidney disease: Secondary | ICD-10-CM | POA: Diagnosis present

## 2018-03-22 DIAGNOSIS — Z8249 Family history of ischemic heart disease and other diseases of the circulatory system: Secondary | ICD-10-CM

## 2018-03-22 DIAGNOSIS — S63501D Unspecified sprain of right wrist, subsequent encounter: Secondary | ICD-10-CM

## 2018-03-22 DIAGNOSIS — K5900 Constipation, unspecified: Secondary | ICD-10-CM | POA: Diagnosis present

## 2018-03-22 DIAGNOSIS — S82209A Unspecified fracture of shaft of unspecified tibia, initial encounter for closed fracture: Secondary | ICD-10-CM | POA: Diagnosis present

## 2018-03-22 DIAGNOSIS — Z794 Long term (current) use of insulin: Secondary | ICD-10-CM | POA: Diagnosis not present

## 2018-03-22 DIAGNOSIS — B258 Other cytomegaloviral diseases: Secondary | ICD-10-CM | POA: Diagnosis not present

## 2018-03-22 DIAGNOSIS — T8149XA Infection following a procedure, other surgical site, initial encounter: Secondary | ICD-10-CM | POA: Diagnosis not present

## 2018-03-22 DIAGNOSIS — Z823 Family history of stroke: Secondary | ICD-10-CM | POA: Diagnosis not present

## 2018-03-22 DIAGNOSIS — E11649 Type 2 diabetes mellitus with hypoglycemia without coma: Secondary | ICD-10-CM | POA: Diagnosis not present

## 2018-03-22 DIAGNOSIS — D62 Acute posthemorrhagic anemia: Secondary | ICD-10-CM | POA: Diagnosis present

## 2018-03-22 DIAGNOSIS — E1169 Type 2 diabetes mellitus with other specified complication: Secondary | ICD-10-CM

## 2018-03-22 DIAGNOSIS — I10 Essential (primary) hypertension: Secondary | ICD-10-CM

## 2018-03-22 DIAGNOSIS — E876 Hypokalemia: Secondary | ICD-10-CM

## 2018-03-22 DIAGNOSIS — Z87891 Personal history of nicotine dependence: Secondary | ICD-10-CM | POA: Diagnosis not present

## 2018-03-22 DIAGNOSIS — B259 Cytomegaloviral disease, unspecified: Secondary | ICD-10-CM | POA: Diagnosis present

## 2018-03-22 DIAGNOSIS — S82409S Unspecified fracture of shaft of unspecified fibula, sequela: Secondary | ICD-10-CM | POA: Diagnosis not present

## 2018-03-22 DIAGNOSIS — S82201D Unspecified fracture of shaft of right tibia, subsequent encounter for closed fracture with routine healing: Secondary | ICD-10-CM | POA: Diagnosis not present

## 2018-03-22 DIAGNOSIS — S82209S Unspecified fracture of shaft of unspecified tibia, sequela: Secondary | ICD-10-CM | POA: Diagnosis not present

## 2018-03-22 LAB — CBC
HCT: 30.6 % — ABNORMAL LOW (ref 36.0–46.0)
HEMOGLOBIN: 9.8 g/dL — AB (ref 12.0–15.0)
MCH: 30.9 pg (ref 26.0–34.0)
MCHC: 32 g/dL (ref 30.0–36.0)
MCV: 96.5 fL (ref 78.0–100.0)
Platelets: 242 10*3/uL (ref 150–400)
RBC: 3.17 MIL/uL — AB (ref 3.87–5.11)
RDW: 14.2 % (ref 11.5–15.5)
WBC: 5.6 10*3/uL (ref 4.0–10.5)

## 2018-03-22 LAB — BASIC METABOLIC PANEL
Anion gap: 8 (ref 5–15)
BUN: 32 mg/dL — AB (ref 6–20)
CHLORIDE: 104 mmol/L (ref 101–111)
CO2: 25 mmol/L (ref 22–32)
CREATININE: 1.62 mg/dL — AB (ref 0.44–1.00)
Calcium: 8.4 mg/dL — ABNORMAL LOW (ref 8.9–10.3)
GFR calc Af Amer: 38 mL/min — ABNORMAL LOW (ref >60)
GFR calc non Af Amer: 33 mL/min — ABNORMAL LOW (ref >60)
GLUCOSE: 177 mg/dL — AB (ref 65–99)
POTASSIUM: 3.7 mmol/L (ref 3.5–5.1)
Sodium: 137 mmol/L (ref 135–145)

## 2018-03-22 LAB — RENAL FUNCTION PANEL
Albumin: 2.4 g/dL — ABNORMAL LOW (ref 3.5–5.0)
Anion gap: 7 (ref 5–15)
BUN: 35 mg/dL — ABNORMAL HIGH (ref 6–20)
CHLORIDE: 104 mmol/L (ref 101–111)
CO2: 25 mmol/L (ref 22–32)
CREATININE: 1.5 mg/dL — AB (ref 0.44–1.00)
Calcium: 8.2 mg/dL — ABNORMAL LOW (ref 8.9–10.3)
GFR calc non Af Amer: 36 mL/min — ABNORMAL LOW (ref >60)
GFR, EST AFRICAN AMERICAN: 42 mL/min — AB (ref >60)
GLUCOSE: 98 mg/dL (ref 65–99)
Phosphorus: 3.5 mg/dL (ref 2.5–4.6)
Potassium: 3.6 mmol/L (ref 3.5–5.1)
Sodium: 136 mmol/L (ref 135–145)

## 2018-03-22 LAB — GLUCOSE, CAPILLARY
GLUCOSE-CAPILLARY: 114 mg/dL — AB (ref 65–99)
GLUCOSE-CAPILLARY: 82 mg/dL (ref 65–99)
Glucose-Capillary: 107 mg/dL — ABNORMAL HIGH (ref 65–99)
Glucose-Capillary: 92 mg/dL (ref 65–99)

## 2018-03-22 MED ORDER — VALGANCICLOVIR HCL 450 MG PO TABS
450.0000 mg | ORAL_TABLET | Freq: Two times a day (BID) | ORAL | Status: DC
  Administered 2018-03-22 – 2018-04-02 (×22): 450 mg via ORAL
  Filled 2018-03-22 (×22): qty 1

## 2018-03-22 MED ORDER — TACROLIMUS 1 MG PO CAPS
2.0000 mg | ORAL_CAPSULE | Freq: Two times a day (BID) | ORAL | Status: DC
  Administered 2018-03-22 – 2018-04-02 (×22): 2 mg via ORAL
  Filled 2018-03-22 (×23): qty 2

## 2018-03-22 MED ORDER — PRAVASTATIN SODIUM 20 MG PO TABS
40.0000 mg | ORAL_TABLET | Freq: Every day | ORAL | Status: DC
  Administered 2018-03-22 – 2018-04-01 (×11): 40 mg via ORAL
  Filled 2018-03-22 (×11): qty 2

## 2018-03-22 MED ORDER — INSULIN PUMP
Freq: Three times a day (TID) | SUBCUTANEOUS | Status: DC
  Administered 2018-03-22: 22:00:00 via SUBCUTANEOUS
  Administered 2018-03-23: 1.1 via SUBCUTANEOUS
  Administered 2018-03-23: 08:00:00 via SUBCUTANEOUS
  Administered 2018-03-23: 0.7 via SUBCUTANEOUS
  Administered 2018-03-24: 1.2 via SUBCUTANEOUS
  Administered 2018-03-24: 07:00:00 via SUBCUTANEOUS
  Administered 2018-03-24: 3 via SUBCUTANEOUS
  Administered 2018-03-25: 2 via SUBCUTANEOUS
  Administered 2018-03-25: 0.9 via SUBCUTANEOUS
  Administered 2018-03-25: 1.8 via SUBCUTANEOUS
  Administered 2018-03-25 – 2018-03-26 (×2): via SUBCUTANEOUS
  Administered 2018-03-27: 1.2 via SUBCUTANEOUS
  Administered 2018-03-27: 1.5 via SUBCUTANEOUS
  Administered 2018-03-27: 1.6 via SUBCUTANEOUS
  Administered 2018-03-28 – 2018-03-30 (×5): via SUBCUTANEOUS
  Administered 2018-03-31: 1.9 via SUBCUTANEOUS
  Administered 2018-03-31: 21:00:00 via SUBCUTANEOUS
  Administered 2018-04-01: 1.1 via SUBCUTANEOUS
  Administered 2018-04-01: 0.6 via SUBCUTANEOUS
  Filled 2018-03-22: qty 1

## 2018-03-22 MED ORDER — ALUM & MAG HYDROXIDE-SIMETH 200-200-20 MG/5ML PO SUSP: 30.0000 mL | ORAL | Status: DC | PRN

## 2018-03-22 MED ORDER — PROCHLORPERAZINE MALEATE 5 MG PO TABS: 5.0000 mg | ORAL_TABLET | Freq: Four times a day (QID) | ORAL | Status: DC | PRN

## 2018-03-22 MED ORDER — HYDRALAZINE HCL 50 MG PO TABS
100.0000 mg | ORAL_TABLET | Freq: Three times a day (TID) | ORAL | Status: DC
  Administered 2018-03-22 – 2018-04-02 (×31): 100 mg via ORAL
  Filled 2018-03-22 (×31): qty 2

## 2018-03-22 MED ORDER — DIPHENHYDRAMINE HCL 12.5 MG/5ML PO ELIX: 12.5000 mg | ORAL_SOLUTION | Freq: Four times a day (QID) | ORAL | Status: DC | PRN

## 2018-03-22 MED ORDER — TRAZODONE HCL 50 MG PO TABS: 25.0000 mg | ORAL_TABLET | Freq: Every evening | ORAL | Status: DC | PRN

## 2018-03-22 MED ORDER — MYCOPHENOLATE SODIUM 180 MG PO TBEC
180.0000 mg | DELAYED_RELEASE_TABLET | Freq: Two times a day (BID) | ORAL | Status: DC
  Administered 2018-03-22 – 2018-04-02 (×22): 180 mg via ORAL
  Filled 2018-03-22 (×22): qty 1

## 2018-03-22 MED ORDER — DOXAZOSIN MESYLATE 4 MG PO TABS
4.0000 mg | ORAL_TABLET | Freq: Every evening | ORAL | Status: DC
  Administered 2018-03-22 – 2018-04-01 (×11): 4 mg via ORAL
  Filled 2018-03-22 (×11): qty 1

## 2018-03-22 MED ORDER — POLYETHYLENE GLYCOL 3350 17 G PO PACK: 17.0000 g | PACK | Freq: Every day | ORAL | Status: DC | PRN

## 2018-03-22 MED ORDER — PREDNISONE 10 MG PO TABS
5.0000 mg | ORAL_TABLET | Freq: Every day | ORAL | Status: DC
  Administered 2018-03-23 – 2018-04-02 (×11): 5 mg via ORAL
  Filled 2018-03-22 (×11): qty 1

## 2018-03-22 MED ORDER — FERROUS SULFATE 325 (65 FE) MG PO TABS
325.0000 mg | ORAL_TABLET | Freq: Every day | ORAL | Status: DC
  Administered 2018-03-23 – 2018-04-02 (×11): 325 mg via ORAL
  Filled 2018-03-22 (×11): qty 1

## 2018-03-22 MED ORDER — BISACODYL 10 MG RE SUPP
10.0000 mg | Freq: Every day | RECTAL | Status: DC | PRN
  Administered 2018-03-23: 10 mg via RECTAL
  Filled 2018-03-22: qty 1

## 2018-03-22 MED ORDER — FLEET ENEMA 7-19 GM/118ML RE ENEM
1.0000 | ENEMA | Freq: Once | RECTAL | Status: AC | PRN
  Administered 2018-03-24: 1 via RECTAL
  Filled 2018-03-22: qty 1

## 2018-03-22 MED ORDER — POLYETHYLENE GLYCOL 3350 17 G PO PACK
17.0000 g | PACK | Freq: Every day | ORAL | Status: DC | PRN
  Administered 2018-03-24 – 2018-03-26 (×2): 17 g via ORAL
  Filled 2018-03-22 (×2): qty 1

## 2018-03-22 MED ORDER — ACETAMINOPHEN 325 MG PO TABS
325.0000 mg | ORAL_TABLET | ORAL | Status: DC | PRN
  Administered 2018-03-24 – 2018-03-25 (×4): 650 mg via ORAL
  Administered 2018-03-26: 325 mg via ORAL
  Administered 2018-03-26 – 2018-04-02 (×10): 650 mg via ORAL
  Filled 2018-03-22 (×16): qty 2

## 2018-03-22 MED ORDER — PROCHLORPERAZINE EDISYLATE 10 MG/2ML IJ SOLN
5.0000 mg | Freq: Four times a day (QID) | INTRAMUSCULAR | Status: DC | PRN
Start: 2018-03-22 — End: 2018-04-02

## 2018-03-22 MED ORDER — AMLODIPINE BESYLATE 10 MG PO TABS
10.0000 mg | ORAL_TABLET | Freq: Every day | ORAL | Status: DC
  Administered 2018-03-23 – 2018-04-02 (×11): 10 mg via ORAL
  Filled 2018-03-22 (×11): qty 1

## 2018-03-22 MED ORDER — GUAIFENESIN-DM 100-10 MG/5ML PO SYRP: 5.0000 mL | ORAL_SOLUTION | Freq: Four times a day (QID) | ORAL | Status: DC | PRN

## 2018-03-22 MED ORDER — SULFAMETHOXAZOLE-TRIMETHOPRIM 400-80 MG PO TABS
1.0000 | ORAL_TABLET | ORAL | Status: DC
  Administered 2018-03-24 – 2018-04-02 (×5): 1 via ORAL
  Filled 2018-03-22 (×5): qty 1

## 2018-03-22 MED ORDER — ONDANSETRON HCL 4 MG/2ML IJ SOLN: 4.0000 mg | Freq: Four times a day (QID) | INTRAMUSCULAR | Status: DC | PRN

## 2018-03-22 MED ORDER — PROCHLORPERAZINE 25 MG RE SUPP: 12.5000 mg | Freq: Four times a day (QID) | RECTAL | Status: DC | PRN

## 2018-03-22 MED ORDER — OXYCODONE HCL 5 MG PO TABS
10.0000 mg | ORAL_TABLET | ORAL | Status: DC | PRN
  Administered 2018-03-22 – 2018-03-23 (×2): 10 mg via ORAL
  Administered 2018-03-23 (×2): 20 mg via ORAL
  Administered 2018-03-25 – 2018-03-29 (×2): 10 mg via ORAL
  Filled 2018-03-22: qty 4
  Filled 2018-03-22 (×3): qty 2
  Filled 2018-03-22 (×3): qty 4

## 2018-03-22 MED ORDER — METOPROLOL TARTRATE 50 MG PO TABS
50.0000 mg | ORAL_TABLET | Freq: Two times a day (BID) | ORAL | Status: DC
  Administered 2018-03-22 – 2018-04-02 (×21): 50 mg via ORAL
  Filled 2018-03-22 (×22): qty 1

## 2018-03-22 MED ORDER — ISOSORBIDE MONONITRATE ER 30 MG PO TB24
60.0000 mg | ORAL_TABLET | Freq: Two times a day (BID) | ORAL | Status: DC
  Administered 2018-03-22 – 2018-04-02 (×22): 60 mg via ORAL
  Filled 2018-03-22 (×22): qty 2

## 2018-03-22 MED ORDER — HEPARIN SODIUM (PORCINE) 5000 UNIT/ML IJ SOLN
5000.0000 [IU] | Freq: Three times a day (TID) | INTRAMUSCULAR | Status: DC
  Filled 2018-03-22 (×5): qty 1

## 2018-03-22 NOTE — Consult Note (Addendum)
WOC Nurse wound consult note Reason for Consult: Consult requested for posterior thigh and left heel.  There are no wounds noted to sacrum or buttocks when these sites were assessed. Wound type: 2 partial thickness non-pressure areas to L posterior thigh - 100% pink with no drainage, 1cm x 1cm x 0.1cm and 2cm x 1cm x 0.1cm; appearance is consistent with skin tears or shear. DTI to L outer heel - 100% purple intact skin, 3cm x 2cm; appearance and location is consistent with where the bone rubbed internally when the foot was rotated with the fracture.  There is generalized edema and bruising surrounding this location. Dressing procedure/placement/frequency: Foam dressings recommended for partial thickness areas on posterior left thigh. Keep left leg elevated to prevent pressure to left heel. Discussed plan of care with patient and husband at the bedside. Please re-consult if further assistance is needed.  Thank-you,  Hannah Mcgeeawn Taji Sather MSN, RN, CWOCN, BurrWCN-AP, CNS [phone]

## 2018-03-22 NOTE — Discharge Summary (Signed)
Physician Discharge Summary  Hannah Valentine WUJ:[id number] DOB: [birth year] DOA: 03/11/2018  PCP: [name], PA  Admit date: 03/11/2018 Discharge date: 03/22/2018  Time spent: 55 minutes  Recommendations for Outpatient Follow-up:  1. Follow-up with transplant nephrologist 1 to 2 weeks post discharge from inpatient rehabilitation for follow-up on CMV infection.  Patient will need weekly CBCs and comprehensive metabolic profile done. 2. Follow-up with Dr. [name] in 2 weeks post discharge from inpatient rehabilitation.   Discharge Diagnoses:  Principal Problem:   Left tibial fracture Active Problems:   CMV infection, acute (HCC)   Fall   History of kidney transplant   Essential hypertension   HLD (hyperlipidemia)   CKD (chronic kidney disease), stage III (HCC)   DM (diabetes mellitus), type 1 (HCC)   Closed fracture of upper end of left fibula   Trauma   FUO (fever of unknown origin)   Hypertensive crisis   Prediabetes   Acute blood loss anemia   Fever   Pressure injury of skin   Discharge Condition: Stable and improved  Diet recommendation: Carb modified  Filed Weights   03/20/18 0524 03/21/18 0550 03/22/18 0500  Weight: 94.4 kg (208 lb 1.8 oz) 95 kg (209 lb 7 oz) 95 kg (209 lb 7 oz)    History of present illness:  Per Dr. Anselmo Hannah Valentine is a very pleasant 62 y.o. female with medical history significant for HTN, CKD stage III, the patient has a history of kidney transplant in 2017, hyperlipidemia, diabetes on insulin pump, who was brought by the EMS, says she suffered a mechanical fall.  In review, the patient tripped over her dog, sustaining an injury on the left leg, when she fell she had broken.  She could not get up.  She localized the pain at the area of the fracture.  She denied any other areas of pain.  She did not hit her head.  She denied any nausea, vomiting, chest pain or palpitations.  She denied any shortness of breath or cough.  The patient denied any  abdominal pain, dysuria, or hematuria.  The patient makes urine.  She denied any right lower extremity swelling.  She denied any tobacco, alcohol or recreational drug use.  No confusion was noted.  She denied any vision changes.  She denied any unilateral weakness.  ED Course:  BP (!) 145/56   Pulse (!) 56   Resp 14   Ht 5\' 7"  (1.702 m)   Wt 90.7 kg (200 lb)   SpO2 97%   BMI 31.32 kg/m   Patient had been receiving fentanyl, with immediate response, but the medicine wearing off rather quickly, before she is being placed on longer lasting meds, including morphine and oral narcotics.  Currently, the pain is about 5 out of 10. X-rays demonstrated left tibial and fibular fracture.  Orthopedic consultation was obtained, and recommended a CT of the knee to rule out articular extension, which confirmed a comminuted tibial metadiaphyseal fracture, with impaction and up to 15 mm of lateral displacement, without significant angulation.  There is no proximal extension of this fracture.  No intra-articular abnormality seen at the tibiofemoral joint.  ORIF was planned for 03/12/2018, with Dr. [name]. Chemistries were remarkable for creatinine of 1.84, slightly more elevated than the one in December at 1.68, but within range.   White count 7.2, hemoglobin 14, platelets 182. PT 13.9, INR 1.08.   Hospital Course:  Left tibial fracture;  -Secondary to mechanical fall, S/P ORIF of left tibial shaft  fracture.  - Management per ORTHO.  -Followed by PT, CIR consulted and patient will be discharged to inpatient rehab for further physical therapy and treatments.    DM type 1;Early DKA -Patient was on insulin pump, that she was quite comfortable with using the pump, DM coordinator follow the patient during the hospitalization.  Patient's last hemoglobin A1c was 6.4.  CBGs noted to be elevated in the 300s on 03/17/2018.  Urinalysis which was done unremarkable.  Beta hydroxybutyrate was elevated.  Patient was noted to  have an anion gap as high as 21 and noted to be acidotic on 03/17/2018.  Patient subsequently transitioned to the stepdown unit and placed on the insulin drip.  Anion gap closed.  Acidosis resolved.  Patient subsequently transition back to her insulin pump.  Blood sugars remain controlled for the rest of the hospitalization.  IV fluids subsequently saline locked.  Outpatient follow-up with PCP.  Acute on CKD stage III - status post renal transplant 2017, on oral immunosuppressant therapy. Baseline getting around 1.7-1.9. Renal function worsened during the hospitalization and patient was initially hydrated gently with IV fluids.  Patient subsequently noted to be hyponatremic and received a dose of IV Lasix.  Patient had good urine output.  Patient's home dose oral Lasix was held.  Patient's renal function started to trend down such that by day of discharge creatinine was down to 1.50 from 1.59 from 1.71 from 1.99 from 2.34 from 2.32 from 2.46.  Lasix has been discontinued per nephrology. Per nephrology if patient develops peripheral edema may resume home dose diuretics.  Patient was maintained on immunosuppressive treatments with Prograf, prednisone.    Patient's MMF dose was reduced per nephrology to 180 mg twice daily, until patient follows up with her transplant nephrologist.    Patient also maintained on home regimen of Bactrim.    Nephrology followed patient throughout the hospitalization.  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  During the hospitalization patient noted to be spiking fevers with unknown etiology.  ID was consulted.  Patient pancultured.  Fever curve trended down.  Chest x-ray negative.  Blood cultures with no growth to date.  CMV DNA PCR urine positive.  CMV DNA PCR serum positive.  EBV DNA PCR titers negative.  Patient on Bactrim for prophylaxis Lower extremity Dopplers negative for DVT.  Patient was reassessed by orthopedics who did not feel infection was  coming from recent surgery. Patient started on valganciclovir per ID for CMV infection.  Will need valganciclovir x21 days per ID rxcs, and will need weekly CBC and CMET while on treatment to follow renal function and white count.  Patient will need to follow-up with her transplant nephrologist for CMV infection post discharge from inpatient rehab.    HTN;  -Remained stable.  Patient was maintained on home regimen of Norvasc, Cardura, hydralazine, imdur and Lopressor.  Outpatient follow-up.    Hyponatremia Likely secondary to hypervolemic hyponatremia.    Patient was initially on IV fluids which was subsequently discontinued.  Patient given a dose of IV Lasix with good diuresis.  Patient's home dose oral Lasix were held.  Patient's urine output improved and hyponatremia had resolved by day of discharge.  We will continue to hold oral Lasix for now and if patient develops peripheral edema could be resumed per nephrology recommendations.    Hypokalemia Repleted.  Type II partial-thickness nonpressure left posterior thigh/DTI to left outer thigh Patient has been seen by wound care continue recommended treatments.  Procedures:  CT left knee 03/11/2018  Chest x-ray 03/15/2018   plain films of the left tib-fib 03/12/2018, 03/11/2018  Lower extremity Dopplers 03/16/2018  ORIF left proximal tibial fracture per Dr. [name] 03/12/2018      Consultations:  Orthopedics: Dr. [name] 03/12/2018  Nephrology: Dr. Rae Lips 03/14/2018  CIR: Dr Allena Katz 03/15/2018  Infectious disease: Dr. Luciana Axe 03/16/2018      Discharge Exam: Vitals:   03/21/18 2319 03/22/18 0425  BP: (!) 153/53 (!) 155/55  Pulse:  63  Resp:  20  Temp:  98.6 F (37 C)  SpO2:  95%    General: NAD Cardiovascular: RRR Respiratory: CTAB  Discharge Instructions     Allergies  Allergen Reactions  . Atorvastatin Other (See Comments)    Muscle weakness  . Hydrocodone-Acetaminophen Nausea Only  . Labetalol Other (See  Comments)    "makes her feel bad"  . Meperidine Nausea Only  . Pregabalin Other (See Comments)    dizzy   Follow-up Information    Transplant nephrologist. Schedule an appointment as soon as possible for a visit in 1 week(s).   Why:  f/u in 1-2 weeks. Needs weekly cbc and cmet patient on valganciclovirn for CMV infection.       Haddix, Gillie Manners, MD. Schedule an appointment as soon as possible for a visit in 2 week(s).   Specialty:  Orthopedic Surgery Contact information: [address] [place] [address] [phone]            The results of significant diagnostics from this hospitalization (including imaging, microbiology, ancillary and laboratory) are listed below for reference.    Significant Diagnostic Studies: Dg Knee 1-2 Views Left  Result Date: 03/11/2018 CLINICAL DATA:  Fall with left leg deformity EXAM: LEFT KNEE - 1-2 VIEW COMPARISON:  None. FINDINGS: Tibial fracture at the metaphyseal junction with 13 mm of lateral displacement. Comminuted fracturing of the proximal fibula. The knee joint is located. Negative for knee joint effusion. Gas may be present superficial to the tibial tuberosity, open fracture is radiographic consideration. IMPRESSION: 1. Displaced proximal tibial fracture without joint effusion or visible articular surface extension. 2. Comminuted proximal fibula. 3. Possible soft tissue gas at the tibial tuberosity. Electronically Signed   By: Marnee Spring M.D.   On: 03/11/2018 11:19   Dg Tibia/fibula Left  Result Date: 03/12/2018 CLINICAL DATA:  62 year old female with comminuted and displaced extra-articular fracture of the proximal left tibia and intra-articular fracture of the proximal left fibula undergoing ORIF. EXAM: DG C-ARM 61-120 MIN; LEFT TIBIA AND FIBULA - 2 VIEW COMPARISON:  CT left knee 03/11/2018. FLUOROSCOPY TIME:  1 minutes 50 seconds FINDINGS: Five intraoperative fluoroscopic spot views of the proximal left tibia and fibula in  the AP and lateral projection. On the final 3 images lateral plate and screw fixation of the proximal left tibia is demonstrated with improved alignment of fracture fragments. Hardware appears intact. The proximal left fibula fracture also appears improved on those images. IMPRESSION: ORIF proximal left tibia with no adverse features. Electronically Signed   By: Odessa Fleming M.D.   On: 03/12/2018 13:11   Dg Tibia/fibula Left  Result Date: 03/11/2018 CLINICAL DATA:  Fall, leg deformity and pain EXAM: LEFT TIBIA AND FIBULA - 2 VIEW COMPARISON:  None. FINDINGS: Comminuted fractures noted through the proximal left tibia and fibula with moderate displacement. No distal tibia or fibular abnormalities. No subluxation or dislocation within the left knee. IMPRESSION: Markedly comminuted and moderately displaced proximal left tibia and  fibular metaphyseal fractures. Electronically Signed   By: Charlett Nose M.D.   On: 03/11/2018 11:17   Dg Ankle 2 Views Left  Result Date: 03/11/2018 CLINICAL DATA:  Fall, left leg deformity and pain EXAM: LEFT ANKLE - 2 VIEW COMPARISON:  None. FINDINGS: No acute bony abnormality. Specifically, no fracture, subluxation, or dislocation. IMPRESSION: No acute bony abnormality. Electronically Signed   By: Charlett Nose M.D.   On: 03/11/2018 11:17   Ct Knee Left Wo Contrast  Result Date: 03/11/2018 CLINICAL DATA:  Fall with leg deformity. Fractures of the proximal tibia and fibula on radiographs. EXAM: CT OF THE LEFT KNEE WITHOUT CONTRAST TECHNIQUE: Multidetector CT imaging of the left knee was performed according to the standard protocol. Multiplanar CT image reconstructions were also generated. COMPARISON:  Radiograph same date. FINDINGS: Bones/Joint/Cartilage Comminuted tibial metadiaphyseal fracture demonstrates impaction and up to 15 mm of lateral displacement. There is no significant angulation. There is no proximal extension of this fracture to the articular surface of the tibia. A small  amount of gas is present within the fracture. There are no specific signs of an open fracture. There is a comminuted and mildly displaced intra-articular fracture of the fibular head and neck. There is no widening of the proximal tibiofibular articulation. The distal femur and patella are intact. There is no significant knee joint effusion or gas in the joint. Ligaments Suboptimally assessed by CT. The cruciate ligaments appear intact at the knee. Muscles and Tendons The extensor mechanism is intact. No focal muscular fluid collections are seen. Soft tissues There is edema/hemorrhage in the subcutaneous fat of the lower leg anteriorly and medially. There is no focal hematoma. A small amount of subcutaneous gas is noted anterior to the tibial tubercle, well proximal to the tibial fracture. Femoropopliteal atherosclerosis noted. IMPRESSION: 1. Comminuted and laterally displaced extra-articular fracture of the proximal left tibial metadiaphysis. 2. Comminuted and mildly displaced intra-articular fracture of the left fibular head and neck. 3. No intra-articular abnormality seen at the tibiofemoral joint. Electronically Signed   By: Carey Bullocks M.D.   On: 03/11/2018 14:52   Dg Chest Port 1 View  Result Date: 03/15/2018 CLINICAL DATA:  Fever. EXAM: PORTABLE CHEST 1 VIEW COMPARISON:  Chest x-ray dated March 11, 2018. FINDINGS: The patient is rotated to the right. Stable mild cardiomegaly. Normal pulmonary vascularity. No focal consolidation, pleural effusion, or pneumothorax. No acute osseous abnormality. IMPRESSION: No active disease. Electronically Signed   By: Obie Dredge M.D.   On: 03/15/2018 15:54   Dg Chest Port 1 View  Result Date: 03/11/2018 CLINICAL DATA:  Encounter for trauma EXAM: PORTABLE CHEST 1 VIEW COMPARISON:  10/03/2017 FINDINGS: The heart is mildly enlarged. Mediastinal contour is normal. There is atherosclerotic calcification of the thoracic aorta. The lungs are free of focal consolidations  and pleural effusions. No pulmonary edema. No pneumothorax. There is streaky atelectasis or scarring at the LEFT lung base. No acute fracture. IMPRESSION: 1. Stable cardiomegaly. 2.  No evidence for acute  abnormality. 3.  Aortic atherosclerosis.  (ICD10-I70.0) Electronically Signed   By: Norva Pavlov M.D.   On: 03/11/2018 11:51   Dg Tibia/fibula Left Port  Result Date: 03/12/2018 CLINICAL DATA:  Patient status post fixation a left tibial fracture which the patient suffered in a fall 03/11/2018. Initial encounter. EXAM: PORTABLE LEFT TIBIA AND FIBULA - 2 VIEW COMPARISON:  CT left knee and plain films left knee 03/11/2018. FINDINGS: Lateral plate and screws are in place for fixation of a proximal tibial  fracture. Position and alignment are markedly improved. Proximal fibular fracture noted. No acute finding. IMPRESSION: Status post ORIF of a left tibial fracture with marked improvement in position and alignment. No new abnormality. Electronically Signed   By: Drusilla Kannerhomas  Dalessio M.D.   On: 03/12/2018 14:11   Dg C-arm 1-60 Min  Result Date: 03/12/2018 CLINICAL DATA:  62 year old female with comminuted and displaced extra-articular fracture of the proximal left tibia and intra-articular fracture of the proximal left fibula undergoing ORIF. EXAM: DG C-ARM 61-120 MIN; LEFT TIBIA AND FIBULA - 2 VIEW COMPARISON:  CT left knee 03/11/2018. FLUOROSCOPY TIME:  1 minutes 50 seconds FINDINGS: Five intraoperative fluoroscopic spot views of the proximal left tibia and fibula in the AP and lateral projection. On the final 3 images lateral plate and screw fixation of the proximal left tibia is demonstrated with improved alignment of fracture fragments. Hardware appears intact. The proximal left fibula fracture also appears improved on those images. IMPRESSION: ORIF proximal left tibia with no adverse features. Electronically Signed   By: Odessa FlemingH  Hall M.D.   On: 03/12/2018 13:11   Dg Femur Min 2 Views Left  Result Date:  03/11/2018 CLINICAL DATA:  Fall, left lower leg deformity. EXAM: LEFT FEMUR 2 VIEWS COMPARISON:  None. FINDINGS: No femoral fracture, subluxation or dislocation. Hip joint and knee joint are intact. Proximal tibial and fibular fractures visualized, reported on knee and tib-fib series IMPRESSION: Proximal tibial and fibular fractures.  See knee and tib fib report. No femoral abnormality. Electronically Signed   By: Charlett NoseKevin  Dover M.D.   On: 03/11/2018 11:16    Microbiology: Recent Results (from the past 240 hour(s))  Culture, blood (routine x 2)     Status: None   Collection Time: 03/14/18  9:10 PM  Result Value Ref Range Status   Specimen Description BLOOD RIGHT ARM  Final   Special Requests   Final    BOTTLES DRAWN AEROBIC ONLY Blood Culture adequate volume   Culture   Final    NO GROWTH 5 DAYS Performed at [facility], [address] [place], [place] [address]    Report Status 03/20/2018 FINAL  Final  Culture, blood (routine x 2)     Status: None   Collection Time: 03/14/18  9:15 PM  Result Value Ref Range Status   Specimen Description BLOOD RIGHT ARM  Final   Special Requests   Final    BOTTLES DRAWN AEROBIC ONLY Blood Culture adequate volume   Culture   Final    NO GROWTH 5 DAYS Performed at [facility] [place] [facility], [address] [place], [place] [address]    Report Status 03/20/2018 FINAL  Final     Labs: Basic Metabolic Panel: Recent Labs  Lab 03/17/18 2200  03/18/18 1702 03/19/18 0509 03/20/18 0202 03/21/18 0243 03/22/18 0301  NA 132*   < > 132* 129* 132* 135 136  K 2.9*   < > 3.8 3.4* 3.2* 3.5 3.6  CL 95*   < > 96* 99* 100* 101 104  CO2 23   < > 22 21* 24 24 25   GLUCOSE 246*   < > 157* 272* 105* 111* 98  BUN 65*   < > 65* 58* 49* 43* 35*  CREATININE 2.65*   < > 2.34* 1.99* 1.71* 1.59* 1.50*  CALCIUM 8.3*   < > 8.4* 7.7* 8.1* 8.3* 8.2*  MG 1.9  --   --   --   --   --   --  PHOS  --   --   --   --   --  3.1 3.5   < > = values in this interval not displayed.    Liver Function Tests: Recent Labs  Lab 03/21/18 0243 03/22/18 0301  ALBUMIN 2.5* 2.4*   No results for input(s): LIPASE, AMYLASE in the last 168 hours. No results for input(s): AMMONIA in the last 168 hours. CBC: Recent Labs  Lab 03/16/18 0435 03/17/18 0712 03/19/18 0509 03/20/18 0202 03/22/18 0301  WBC 9.3 9.1 7.5 5.8 5.6  HGB 11.1* 10.9* 10.0* 10.0* 9.8*  HCT 34.2* 33.9* 30.3* 30.5* 30.6*  MCV 95.8 96.3 94.1 93.8 96.5  PLT 172 179 175 191 242   Cardiac Enzymes: No results for input(s): CKTOTAL, CKMB, CKMBINDEX, TROPONINI in the last 168 hours. BNP: BNP (last 3 results) Recent Labs    10/04/17 0245  BNP 379.4*    ProBNP (last 3 results) No results for input(s): PROBNP in the last 8760 hours.  CBG: Recent Labs  Lab 03/21/18 1744 03/21/18 2207 03/22/18 0213 03/22/18 0706 03/22/18 1209  GLUCAP 93 52* 114* 107* 82       Signed:  Ramiro Harvest MD.  [facility] 03/22/2018, 3:05 PM

## 2018-03-22 NOTE — Progress Notes (Signed)
Orthopaedic Trauma Progress Note  S: Feeling better.  Leg doing okay.  She has been wearing the brace in bed she also states that she has not been moving the knee or the ankle really since her fevers and her diagnosis with the CMV has started.  O:  Vitals:   03/21/18 2319 03/22/18 0425  BP: (!) 153/53 (!) 155/55  Pulse:  63  Resp:  20  Temp:  98.6 F (37 C)  SpO2:  95%  Cooperative and pleasant General: No acute distress awake alert and oriented,  Left lower extremity: Moderate blue bruising.  Incisions are clean dry and intact.  No signs of infection.  Significant equinus contracture to the left lower extremity.  Flexion to about 50 degrees.  Overall good alignment to the lower extremity.  Motor and sensory function intact distally  Labs:  Results for orders placed or performed during the hospital encounter of 03/11/18 (from the past 24 hour(s))  Glucose, capillary     Status: Abnormal   Collection Time: 03/21/18 11:42 AM  Result Value Ref Range   Glucose-Capillary 134 (H) 65 - 99 mg/dL  Glucose, capillary     Status: Abnormal   Collection Time: 03/21/18  4:13 PM  Result Value Ref Range   Glucose-Capillary 59 (L) 65 - 99 mg/dL  Glucose, capillary     Status: None   Collection Time: 03/21/18  5:44 PM  Result Value Ref Range   Glucose-Capillary 93 65 - 99 mg/dL  Glucose, capillary     Status: Abnormal   Collection Time: 03/21/18 10:07 PM  Result Value Ref Range   Glucose-Capillary 52 (L) 65 - 99 mg/dL  Glucose, capillary     Status: Abnormal   Collection Time: 03/22/18  2:13 AM  Result Value Ref Range   Glucose-Capillary 114 (H) 65 - 99 mg/dL  Renal function panel     Status: Abnormal   Collection Time: 03/22/18  3:01 AM  Result Value Ref Range   Sodium 136 135 - 145 mmol/L   Potassium 3.6 3.5 - 5.1 mmol/L   Chloride 104 101 - 111 mmol/L   CO2 25 22 - 32 mmol/L   Glucose, Bld 98 65 - 99 mg/dL   BUN 35 (H) 6 - 20 mg/dL   Creatinine, Ser 1.61 (H) 0.44 - 1.00 mg/dL   Calcium 8.2 (L) 8.9 - 10.3 mg/dL   Phosphorus 3.5 2.5 - 4.6 mg/dL   Albumin 2.4 (L) 3.5 - 5.0 g/dL   GFR calc non Af Amer 36 (L) >60 mL/min   GFR calc Af Amer 42 (L) >60 mL/min   Anion gap 7 5 - 15  CBC     Status: Abnormal   Collection Time: 03/22/18  3:01 AM  Result Value Ref Range   WBC 5.6 4.0 - 10.5 K/uL   RBC 3.17 (L) 3.87 - 5.11 MIL/uL   Hemoglobin 9.8 (L) 12.0 - 15.0 g/dL   HCT 09.6 (L) 04.5 - 40.9 %   MCV 96.5 78.0 - 100.0 fL   MCH 30.9 26.0 - 34.0 pg   MCHC 32.0 30.0 - 36.0 g/dL   RDW 81.1 91.4 - 78.2 %   Platelets 242 150 - 400 K/uL    Assessment: 62 year old female with a history of hypertension and stage III kidney disease status post kidney transplant 2017 and insulin-dependent diabetes on insulin pump who had a mechanical fall  Injuries: Left proximal tibia fracture status post ORIF on 03/12/2018  Weightbearing: Nonweightbearing to the left lower extremity  Insicional and dressing care: Okay to leave open to air  Orthopedic device(s): Hinged knee brace when out of bed  ROM: Aggressive ankle and knee range of motion.patient has developed significant equinus contracture  CV/Blood loss:Stable  Pain management: Oxycodone  VTE prophylaxis: Heparin 5000 units 3 times a day  ID: Per hospitalist and renal team currently on valganciclovir for CMV and bactrim MWF  Foley/Lines: None  Impediments to Fracture Healing: Osteoporosis, status post renal transplant  Dispo: Acute inpatient rehab once stable  Follow - up plan: Will likely plan for x-rays and suture removal later this week   Roby LoftsKevin P. Haddix, MD [facility] [phone] (phone)

## 2018-03-22 NOTE — H&P (Signed)
Physical Medicine and Rehabilitation Admission H&P    Chief Complaint  Patient presents with  . Leg Injury  : HPI: Hannah Valentine is a 62 year old right-handed female with history of hypertension, diabetes mellitus with insulin pump, renal transplant 2017 with baseline serum creatinine 1.6-1.7 and has a left fistula.  Per chart review patient lives with spouse.  Independent prior to admission.  Multilevel home with bedroom upstairs.  Admitted 03/11/2018 with fall and subsequent left tibia-fibula fracture.  There was no loss of consciousness.  She was evaluated by Dr. Doreatha Martin with orthopedic services and underwent ORIF left proximal tibia 03/12/2018.  Non-weightbearing with hinged knee brace.  Hospital course pain management.  Acute blood loss anemia and monitored.  Patient developed significant fevers with sweats and mild arthralgia.  Acute on chronic renal failure as well as poorly controlled blood sugars with signs of early DKA on 03/17/2018.  Her Lasix was placed on hold she was treated with gentle hydration renal status continued to improve followed by nephrology services.  Dr. De Burrs of infectious disease consulted for input in regards to fever work-up revealed urine positive CMV PCR which she was started on valganciclovir 03/16/2018 for CMV x21 days as well as Bactrim.  Blood culture showed no growth, lower extremity Dopplers negative.  Subcutaneous heparin for DVT prophylaxis. WOC follow-up 03/22/2018 for partial thickness nonpressure area left posterior thigh with order for foam dressings.  Physical and occupational therapy evaluations completed with recommendations of physical medicine rehab consult.  Patient was admitted for a comprehensive rehab program.  Review of Systems  Constitutional: Positive for fever.  HENT: Negative for hearing loss.   Eyes: Negative for blurred vision and double vision.  Respiratory: Negative for cough and shortness of breath.   Cardiovascular: Positive for leg  swelling. Negative for chest pain and palpitations.  Gastrointestinal: Positive for constipation. Negative for nausea and vomiting.  Genitourinary: Negative for dysuria and hematuria.  Musculoskeletal: Positive for joint pain.  Skin: Negative for rash.  All other systems reviewed and are negative.  Past Medical History:  Diagnosis Date  . CKD (chronic kidney disease), stage III ([place])   . Complication of anesthesia   . Essential hypertension   . HLD (hyperlipidemia)   . Kidney transplanted 2017  . PONV (postoperative nausea and vomiting)   . Type II diabetes mellitus with renal manifestations [facility])    Past Surgical History:  Procedure Laterality Date  . NEPHRECTOMY TRANSPLANTED ORGAN    . OPEN REDUCTION INTERNAL FIXATION (ORIF) TIBIA/FIBULA FRACTURE Left 03/12/2018   Procedure: OPEN REDUCTION INTERNAL FIXATION (ORIF) TIBIA/FIBULA FRACTURE;  Surgeon: Shona Needles, MD;  Location: [facility];  Service: Orthopedics;  Laterality: Left;  . ORIF TIBIA FRACTURE Left 03/12/2018   Family History  Problem Relation Age of Onset  . CAD Mother   . Lung cancer Mother   . Stroke Father   . Heart attack Father   . Diabetes Mellitus I Father   . Hypertension Father   . Breast cancer Sister   . Hypertension Sister   . Heart attack Brother    Social History:  reports that she has quit smoking. She has never used smokeless tobacco. She reports that she does not drink alcohol or use drugs. Allergies:  Allergies  Allergen Reactions  . Atorvastatin Other (See Comments)    Muscle weakness  . Hydrocodone-Acetaminophen Nausea Only  . Labetalol Other (See Comments)    "makes her feel bad"  . Meperidine Nausea Only  . Pregabalin Other (See  Comments)    dizzy   Medications Prior to Admission  Medication Sig Dispense Refill  . amLODipine (NORVASC) 5 MG tablet Take 5 mg by mouth daily.  2  . aspirin EC 81 MG tablet Take 81 mg by mouth at bedtime.     . Cholecalciferol (VITAMIN D) 2000 units tablet  Take 5,000 Units by mouth daily.     Marland Kitchen doxazosin (CARDURA) 4 MG tablet Take 4 mg by mouth every evening.  3  . ferrous sulfate 325 (65 FE) MG EC tablet Take 325 mg by mouth daily.    . furosemide (LASIX) 80 MG tablet Take 80 mg by mouth daily.  3  . hydrALAZINE (APRESOLINE) 100 MG tablet Take 100 mg by mouth 3 (three) times daily.  3  . Insulin Human (INSULIN PUMP) SOLN Inject into the skin continuous. Novolog    . isosorbide mononitrate (IMDUR) 60 MG 24 hr tablet Take 60 mg by mouth 2 (two) times daily.    . metoprolol tartrate (LOPRESSOR) 25 MG tablet Take 50 mg by mouth 2 (two) times daily.    . mycophenolate (MYFORTIC) 180 MG EC tablet Take 180-360 mg by mouth See admin instructions. Take 360 mg every morning and take 180 mg every evening    . pravastatin (PRAVACHOL) 40 MG tablet Take 40 mg by mouth at bedtime.   3  . predniSONE (DELTASONE) 5 MG tablet Take 5 mg by mouth daily.    Marland Kitchen sulfamethoxazole-trimethoprim (BACTRIM,SEPTRA) 400-80 MG tablet Take 1 tablet by mouth every Monday, Wednesday, and Friday.  2  . tacrolimus (PROGRAF) 1 MG capsule Take 2 mg by mouth 2 (two) times daily.       Drug Regimen Review Drug regimen was reviewed and remains appropriate with no significant issues identified  Home: Home Living Family/patient expects to be discharged to:: Private residence Living Arrangements: Spouse/significant other Available Help at Discharge: Family, Available 24 hours/day Type of Home: House Home Access: Level entry(into garage ) Home Layout: Multi-level(garage level has full bathroom but no bed) Alternate Level Stairs-Number of Steps: 12 Alternate Level Stairs-Rails: Right Bathroom Shower/Tub: Holiday representative Accessibility: (not sure; entrance to bathroom in garage may be tricky) Home Equipment: None   Functional History: Prior Function Level of Independence: Independent Comments: retired  Functional Status:  Mobility: Bed Mobility Overal bed mobility:  Needs Assistance Bed Mobility: Rolling Rolling: Min guard Supine to sit: Min assist, HOB elevated Sit to supine: Mod assist, +2 for safety/equipment General bed mobility comments: Min A to manage LLE  Transfers Overall transfer level: Needs assistance Equipment used: None Transfers: Lateral/Scoot Transfers  Lateral/Scoot Transfers: Min assist, +2 safety/equipment General transfer comment: in bed and tired when PT arrived      ADL: ADL Overall ADL's : Needs assistance/impaired Eating/Feeding: Set up, Sitting Grooming: Set up, Sitting Upper Body Bathing: Sitting, Minimal assistance Lower Body Bathing: Maximal assistance, Sitting/lateral leans, Bed level Upper Body Dressing : Sitting, Set up, Supervision/safety Lower Body Dressing: Maximal assistance, Sitting/lateral leans, Bed level Toilet Transfer: Minimal assistance, +2 for safety/equipment Toilet Transfer Details (indicate cue type and reason): simulated with lateral scoot from bed to drop arm recliner going to pt's right. 1 to A with LLE and the other person for safety as pt scooted. Functional mobility during ADLs: Minimal assistance(lateral scoot ) General ADL Comments: patient motivated to participate despite decreased activity tolerance and recent fever; nursing assisted with management of L LE during functional transfer for pain management and safety   Cognition: Cognition Overall  Cognitive Status: Within Functional Limits for tasks assessed Orientation Level: Oriented X4 Cognition Arousal/Alertness: Awake/alert Behavior During Therapy: WFL for tasks assessed/performed Overall Cognitive Status: Within Functional Limits for tasks assessed General Comments: wants to get her mobility to increase despite pain  Physical Exam: Blood pressure (!) 155/55, pulse 63, temperature 98.6 F (37 C), temperature source Oral, resp. rate 20, height 5' 7"  (1.702 m), weight 95 kg (209 lb 7 oz), SpO2 95 %. Physical Exam  Constitutional:  She is oriented to person, place, and time. She appears well-developed and well-nourished.  HENT:  Head: Normocephalic.  Eyes: Pupils are equal, round, and reactive to light. EOM are normal. No scleral icterus.  Neck: Normal range of motion. No tracheal deviation present. No thyromegaly present.  Cardiovascular: Normal rate and regular rhythm. Exam reveals no friction rub.  No murmur heard. Respiratory: Effort normal. No respiratory distress. She has no wheezes.  GI: Soft. She exhibits no distension. There is no tenderness. There is no rebound.  Musculoskeletal: She exhibits edema.  Tender along right hip/inguinal area, non-specific  Neurological: She is alert and oriented to person, place, and time.  UE 5/5.   RLE: 4/5. LLE: limited by pain 1-2/5, wiggles toes.  . No sensory findings  Skin:  Scattered wounds, incisions LLE  Psychiatric: She has a normal mood and affect. Her behavior is normal.    Results for orders placed or performed during the hospital encounter of 03/11/18 (from the past 48 hour(s))  Glucose, capillary     Status: None   Collection Time: 03/20/18 11:21 AM  Result Value Ref Range   Glucose-Capillary 92 65 - 99 mg/dL   Comment 1 Notify RN   Glucose, capillary     Status: Abnormal   Collection Time: 03/20/18  4:22 PM  Result Value Ref Range   Glucose-Capillary 104 (H) 65 - 99 mg/dL   Comment 1 Notify RN   Glucose, capillary     Status: Abnormal   Collection Time: 03/20/18  9:15 PM  Result Value Ref Range   Glucose-Capillary 137 (H) 65 - 99 mg/dL  Renal function panel     Status: Abnormal   Collection Time: 03/21/18  2:43 AM  Result Value Ref Range   Sodium 135 135 - 145 mmol/L   Potassium 3.5 3.5 - 5.1 mmol/L   Chloride 101 101 - 111 mmol/L   CO2 24 22 - 32 mmol/L   Glucose, Bld 111 (H) 65 - 99 mg/dL   BUN 43 (H) 6 - 20 mg/dL   Creatinine, Ser 1.59 (H) 0.44 - 1.00 mg/dL   Calcium 8.3 (L) 8.9 - 10.3 mg/dL   Phosphorus 3.1 2.5 - 4.6 mg/dL   Albumin 2.5 (L)  3.5 - 5.0 g/dL   GFR calc non Af Amer 34 (L) >60 mL/min   GFR calc Af Amer 39 (L) >60 mL/min    Comment: (NOTE) The eGFR has been calculated using the CKD EPI equation. This calculation has not been validated in all clinical situations. eGFR's persistently <60 mL/min signify possible Chronic Kidney Disease.    Anion gap 10 5 - 15    Comment: Performed at [facility] [address] [place], [place] [address]  Glucose, capillary     Status: None   Collection Time: 03/21/18  5:57 AM  Result Value Ref Range   Glucose-Capillary 98 65 - 99 mg/dL  Glucose, capillary     Status: Abnormal   Collection Time: 03/21/18 11:42 AM  Result Value Ref  Range   Glucose-Capillary 134 (H) 65 - 99 mg/dL  Glucose, capillary     Status: Abnormal   Collection Time: 03/21/18  4:13 PM  Result Value Ref Range   Glucose-Capillary 59 (L) 65 - 99 mg/dL  Glucose, capillary     Status: None   Collection Time: 03/21/18  5:44 PM  Result Value Ref Range   Glucose-Capillary 93 65 - 99 mg/dL  Glucose, capillary     Status: Abnormal   Collection Time: 03/21/18 10:07 PM  Result Value Ref Range   Glucose-Capillary 52 (L) 65 - 99 mg/dL  Glucose, capillary     Status: Abnormal   Collection Time: 03/22/18  2:13 AM  Result Value Ref Range   Glucose-Capillary 114 (H) 65 - 99 mg/dL  Renal function panel     Status: Abnormal   Collection Time: 03/22/18  3:01 AM  Result Value Ref Range   Sodium 136 135 - 145 mmol/L   Potassium 3.6 3.5 - 5.1 mmol/L   Chloride 104 101 - 111 mmol/L   CO2 25 22 - 32 mmol/L   Glucose, Bld 98 65 - 99 mg/dL   BUN 35 (H) 6 - 20 mg/dL   Creatinine, Ser 1.50 (H) 0.44 - 1.00 mg/dL   Calcium 8.2 (L) 8.9 - 10.3 mg/dL   Phosphorus 3.5 2.5 - 4.6 mg/dL   Albumin 2.4 (L) 3.5 - 5.0 g/dL   GFR calc non Af Amer 36 (L) >60 mL/min   GFR calc Af Amer 42 (L) >60 mL/min    Comment: (NOTE) The eGFR has been calculated using the CKD EPI equation. This calculation has not been validated in all  clinical situations. eGFR's persistently <60 mL/min signify possible Chronic Kidney Disease.    Anion gap 7 5 - 15    Comment: Performed at [facility] [address], [place], [place] [address]  CBC     Status: Abnormal   Collection Time: 03/22/18  3:01 AM  Result Value Ref Range   WBC 5.6 4.0 - 10.5 K/uL   RBC 3.17 (L) 3.87 - 5.11 MIL/uL   Hemoglobin 9.8 (L) 12.0 - 15.0 g/dL   HCT 30.6 (L) 36.0 - 46.0 %   MCV 96.5 78.0 - 100.0 fL   MCH 30.9 26.0 - 34.0 pg   MCHC 32.0 30.0 - 36.0 g/dL   RDW 14.2 11.5 - 15.5 %   Platelets 242 150 - 400 K/uL    Comment: Performed at [facility], [place] [address], [place], [place] [address]  Glucose, capillary     Status: Abnormal   Collection Time: 03/22/18  7:06 AM  Result Value Ref Range   Glucose-Capillary 107 (H) 65 - 99 mg/dL   No results found.     Medical Problem List and Plan: 1.  Decreased functional mobility secondary to left tibia-fibula fracture after fall status post ORIF 03/12/2018.  Non-weightbearing with hinged knee brace.  -admit to inpatient rehab 2.  DVT Prophylaxis/Anticoagulation: Subcutaneous heparin.  Venous Doppler studies negative 3. Pain Management: Oxycodone as needed 4. Mood: Provide emotional support 5. Neuropsych: This patient is capable of making decisions on her own behalf. 6. Skin/Wound Care: Routine skin checks 7. Fluids/Electrolytes/Nutrition: Routine in and outs with follow-up chemistries 8.  Acute on chronic anemia.  Follow-up CBC.  Continue iron supplement 9.Marland KitchenID/CMV infection.  Continue Valganciclovir per infectious disease as well as Bactrim x21 days.  Follow-up transplant nephrologist as outpatient 10.  Diabetes mellitus.  Patient with insulin pump.  Diabetic coordinator follow-up.  Hemoglobin A1c 5.8.  -sugars under reasonable control at present 11.  CKD stage III with history of kidney transplant 2017.  Follow-up chemistries.  Continue Prograf and chronic prednisone  -Cr trending down 1.44  today 12.  Hypertension.  Norvasc 10 mg daily, Cardura 4 mg daily, hydralazine 100 mg every 8 hours, imdur 60 mg twice daily, Lopressor 50 mg twice daily.    -fair control at present 13.  Hyperlipidemia.  Pravachol 14.  Constipation.  Laxative assistance  Post Admission Physician Evaluation: 1. Functional deficits secondary  to left tib-fib fx. 2. Patient is admitted to receive collaborative, interdisciplinary care between the physiatrist, rehab nursing staff, and therapy team. 3. Patient's level of medical complexity and substantial therapy needs in context of that medical necessity cannot be provided at a lesser intensity of care such as a SNF. 4. Patient has experienced substantial functional loss from his/her baseline which was documented above under the "Functional History" and "Functional Status" headings.  Judging by the patient's diagnosis, physical exam, and functional history, the patient has potential for functional progress which will result in measurable gains while on inpatient rehab.  These gains will be of substantial and practical use upon discharge  in facilitating mobility and self-care at the household level. 5. Physiatrist will provide 24 hour management of medical needs as well as oversight of the therapy plan/treatment and provide guidance as appropriate regarding the interaction of the two. 6. The Preadmission Screening has been reviewed and patient status is unchanged unless otherwise stated above. 7. 24 hour rehab nursing will assist with bladder management, bowel management, safety, skin/wound care, disease management, medication administration, pain management and patient education  and help integrate therapy concepts, techniques,education, etc. 8. PT will assess and treat for/with: Lower extremity strength, range of motion, stamina, balance, functional mobility, safety, adaptive techniques and equipment, pain mgt, orthor precautions.   Goals are: mod I to  supervision. 9. OT will assess and treat for/with: ADL's, functional mobility, safety, upper extremity strength, adaptive techniques and equipment, pain mgt, ortho precautions, community reentry.   Goals are: mod I to min assist. Therapy may proceed with showering this patient if leg is covered. 10. Case Management and Social Worker will assess and treat for psychological issues and discharge planning. 11. Team conference will be held weekly to assess progress toward goals and to determine barriers to discharge. 12. Patient will receive at least 3 hours of therapy per day at least 5 days per week. 13. ELOS: 9-14 days       14. Prognosis:  excellent   I have personally performed a face to face diagnostic evaluation of this patient and formulated the key components of the plan.  Additionally, I have personally reviewed laboratory data, imaging studies, as well as relevant notes and concur with the physician assistant's documentation above.  Meredith Staggers, MD, FAAPMR     Lavon Paganini McIntosh, PA-C 03/22/2018

## 2018-03-22 NOTE — Progress Notes (Signed)
Marcello Fennel, MD      Marcello Fennel, MD  Physician  Physical Medicine and Rehabilitation      Consult Note  Signed     Date of Service:  03/15/2018  8:19 AM         Related encounter: ED to Hosp-Admission (Current) from 03/11/2018 in [facility]             Signed          Expand All Collapse All            Expand widget buttonCollapse widget button    Show:Clear all   ManualTemplateCopied  Added by:     Jacquelynn Cree, PA-C  Marcello Fennel, MD   Hover for detailscustomization button                                                                                                                                                              untitled image              Physical Medicine and Rehabilitation Consult     Reason for Consult: Functional deficits due to Tib-Fib fracture  Referring Physician: Dr. Sunnie Nielsen.         HPI: Hannah Valentine is a 62 y.o. female with history of HTN, renal transplant with baseline SCr 1.6-1.7 who was admitted on 03/11/18 with fall --tripped over her dog with subsequent left tib fib fracture. History taken from chart review and patient. She was evaluated by Dr. [name] and underwent ORIF left proximal tibia on 06/07. To be TDWB with hinged knee brace for support. Therapy evaluations done this weekend revealing deficits in mobility as well as ability to carry out ADLs. CIR recommended due to functional decline.       Review of Systems   Constitutional: Negative for fever.   HENT: Negative for hearing loss and tinnitus.    Eyes: Negative for blurred vision.   Respiratory: Negative for cough and sputum production.    Cardiovascular: Positive for leg swelling.  Negative for chest pain and palpitations.   Gastrointestinal: Positive for constipation. Negative for heartburn and nausea.   Genitourinary: Negative for dysuria and urgency.   Musculoskeletal: Positive for joint pain. Negative for back pain and myalgias.   Skin: Negative for rash.   Neurological: Negative for dizziness and headaches.   Psychiatric/Behavioral: Negative for depression. The patient is not nervous/anxious.    All other systems reviewed and are negative.             Past Medical History:    Diagnosis   Date    .   CKD (chronic kidney  disease), stage III (HCC)        .   Essential hypertension        .   HLD (hyperlipidemia)        .   Kidney transplanted   2017    .   Type II diabetes mellitus with renal manifestations [facility])                    Past Surgical History:    Procedure   Laterality   Date    .   NEPHRECTOMY TRANSPLANTED ORGAN                        Family History    Problem   Relation   Age of Onset    .   CAD   Mother        .   Lung cancer   Mother        .   Stroke   Father        .   Heart attack   Father        .   Diabetes Mellitus I   Father        .   Hypertension   Father        .   Breast cancer   Sister        .   Hypertension   Sister        .   Heart attack   Brother              Social History:  Married. Husband retired and can provide supervision after discharge. 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  Independent PTA. She reports that she has never smoked. She has never used smokeless tobacco. She reports that she does not drink alcohol or use drugs.               Allergies    Allergen   Reactions    .   Atorvastatin   Other (See Comments)            Muscle weakness    .   Hydrocodone-Acetaminophen   Nausea Only    .   Labetalol   Other (See Comments)             "makes her feel bad"    .   Meperidine   Nausea Only    .   Pregabalin   Other (See Comments)            dizzy                 Medications Prior to Admission    Medication   Sig   Dispense   Refill    .   amLODipine (NORVASC) 5 MG tablet   Take 5 mg by mouth daily.       2    .   aspirin EC 81 MG tablet   Take 81 mg by mouth at bedtime.             .   Cholecalciferol (VITAMIN D) 2000 units tablet   Take 5,000 Units by mouth daily.             Marland Kitchen   doxazosin (CARDURA) 4 MG tablet   Take 4 mg by mouth every evening.       3    .   ferrous sulfate 325 (65 FE) MG EC tablet  Take 325 mg by mouth daily.            .   furosemide (LASIX) 80 MG tablet   Take 80 mg by mouth daily.       3    .   hydrALAZINE (APRESOLINE) 100 MG tablet   Take 100 mg by mouth 3 (three) times daily.       3    .   Insulin Human (INSULIN PUMP) SOLN   Inject into the skin continuous. Novolog            .   isosorbide mononitrate (IMDUR) 60 MG 24 hr tablet   Take 60 mg by mouth 2 (two) times daily.            .   metoprolol tartrate (LOPRESSOR) 25 MG tablet   Take 50 mg by mouth 2 (two) times daily.            .   mycophenolate (MYFORTIC) 180 MG EC tablet   Take 180-360 mg by mouth See admin instructions. Take 360 mg every morning and take 180 mg every evening            .   pravastatin (PRAVACHOL) 40 MG tablet   Take 40 mg by mouth at bedtime.        3    .   predniSONE (DELTASONE) 5 MG tablet   Take 5 mg by mouth daily.            Marland Kitchen   sulfamethoxazole-trimethoprim (BACTRIM,SEPTRA) 400-80 MG tablet   Take 1 tablet by mouth every Monday, Wednesday, and Friday.       2    .   tacrolimus (PROGRAF) 1 MG capsule   Take 2 mg by mouth 2 (two) times daily.                   Home:  Home Living  Family/patient expects to be discharged  to:: Private residence  Living Arrangements: Spouse/significant other  Available Help at Discharge: Family, Available 24 hours/day  Type of Home: House  Home Access: Level entry(into garage )  Home Layout: Multi-level(garage level has full bathroom but no bed)  Alternate Level Stairs-Number of Steps: 12  Alternate Level Stairs-Rails: Right  Bathroom Shower/Tub: Engineer, mining Accessibility: (not sure; entrance to bathroom in garage may be tricky)  Home Equipment: None   Functional History:  Prior Function  Level of Independence: Independent  Comments: retired  Functional Status:   Mobility:  Bed Mobility  Overal bed mobility: Needs Assistance  Bed Mobility: Supine to Sit, Sit to Supine  Supine to sit: Min assist  Sit to supine: Mod assist, +2 for safety/equipment  General bed mobility comments: Min A to manage LLE   Transfers  Overall transfer level: Needs assistance  Equipment used: None  Transfers: Lateral/Scoot Transfers   Lateral/Scoot Transfers: Mod assist  General transfer comment: Mod A to manage LLE and block R knee         ADL:  ADL  Overall ADL's : Needs assistance/impaired  Eating/Feeding: Set up, Sitting  Grooming: Set up, Sitting  Upper Body Bathing: Sitting, Minimal assistance  Lower Body Bathing: Maximal assistance, Sitting/lateral leans, Bed level  Upper Body Dressing : Sitting, Set up, Supervision/safety  Lower Body Dressing: Maximal assistance, Sitting/lateral leans, Bed level  Toilet Transfer: Moderate assistance, Requires drop arm(Lateral scoot to drop arm recliner)  Toilet Transfer Details (indicate cue type and reason): Pt requiring Mod A  to manage LLE to reduce pain. Pt demonstrating high motivation to laterally scoot to a drop arm recliner. Educating pt on use of drop arm commode for toileting   Functional mobility during ADLs: Moderate assistance(lateral scoots)  General ADL Comments: Pt  demonstrating high motivation to participate in therapy despite fatigue and pain. Pt performing lateral scoot to drop arm recliner with Mod A for LLE management. Requiring Min VCs for hand placement. Pt's husband very supportive and providing encouragement and cues for scooting.     Cognition:  Cognition  Overall Cognitive Status: Within Functional Limits for tasks assessed  Orientation Level: Oriented X4  Cognition  Arousal/Alertness: Awake/alert  Behavior During Therapy: WFL for tasks assessed/performed  Overall Cognitive Status: Within Functional Limits for tasks assessed  General Comments: highly motivated to participate in therapy        Blood pressure (!) 163/61, pulse 88, temperature (!) 97.4 F (36.3 C), temperature source Oral, resp. rate (!) 22, height 5\' 7"  (1.702 m), weight 99.5 kg (219 lb 5.7 oz), SpO2 96 %.  Physical Exam   Nursing note and vitals reviewed.  Constitutional: She is oriented to person, place, and time. She appears well-developed.  obese   HENT:   Head: Normocephalic.  Resolving ecchymosis right cheek.    Eyes: EOM are normal. Right eye exhibits no discharge. Left eye exhibits no discharge.   Neck: Normal range of motion. Neck supple.   Cardiovascular: Normal rate and regular rhythm.   Respiratory: Effort normal and breath sounds normal.   GI: Soft. Bowel sounds are normal.  Musculoskeletal:  Left lower extremity edema and tenderness  Neurological: She is alert and oriented to person, place, and time.  Motor: Bilateral upper extremities, right lower extremity: 5/5 proximal distal Left lower extremity: Hip flexion, knee extension, ankle dorsiflexion 1/5, wiggles toes Sensation intact light touch   Skin:  Multiple ecchymotic areas BUE.  Compressive dressing and KI in place on LLE         Lab Results Last 24 Hours  Imaging Results (Last 48 hours)          Assessment/Plan:  Diagnosis: Left tib-fib fracture  Labs independently reviewed.  Records reviewed and summated above.     1.Does the need for close, 24 hr/day medical supervision in concert with the patient's rehab needs make it unreasonable for this patient to be served in a less intensive setting? Yes    2.Co-Morbidities requiring supervision/potential complications: fevers (cont to monitor for signs and symptoms of infection, further workup if indicated), hypertensive crisis with essential hypertension (monitor and provide prns in accordance with increased physical exertion and pain), prediabetes (Monitor in accordance with exercise and adjust meds as necessary), renal transplant with baseline (avoid nephrotoxic meds), ABLA (transfuse if necessary to ensure appropriate perfusion for increased activity tolerance)   3.Due to bladder management, safety, skin/wound care, disease management, pain management and patient education, does the patient require 24 hr/day rehab nursing? Yes   4.Does the patient require coordinated care of a physician, rehab nurse, PT (1-2 hrs/day, 5 days/week) and OT (1-2 hrs/day, 5 days/week) to address physical and functional deficits in the context of the above medical  diagnosis(es)? Yes Addressing deficits in the following areas: balance, endurance, locomotion, strength, transferring, bathing, dressing, toileting and psychosocial support   5.Can the patient actively participate in an intensive therapy program of at least 3 hrs of therapy per day at least 5 days per week? Yes   6.The potential for patient to make measurable gains while on inpatient rehab is excellent   7.Anticipated functional outcomes upon discharge from inpatient rehab are supervision and min assist  with PT, supervision and min assist with OT, n/a with SLP.   8.Estimated rehab length of stay to reach the above functional goals is: 9-14 days.   9.Anticipated D/C setting: Home   10.Anticipated post D/C treatments: HH therapy and Home excercise program   11.Overall Rehab/Functional Prognosis: excellent and good      RECOMMENDATIONS:  This patient's condition is appropriate for continued rehabilitative care in the following setting: CIR caregiver support available upon discharge once medically stable (patient currently with fever)  Patient has agreed to participate in recommended program. Yes  Note that insurance prior authorization may be required for reimbursement for recommended care.     Comment: Rehab Admissions Coordinator to follow up.        I have personally performed a face to face diagnostic evaluation, including, but not limited to relevant history and physical exam findings, of this patient and developed relevant assessment and plan.  Additionally, I have reviewed and concur with the physician assistant's documentation above.      Maryla Morrow, MD, ABPMR  Jacquelynn Cree, PA-C  03/15/2018                Revision History                                        Routing History

## 2018-03-22 NOTE — Progress Notes (Signed)
Inpatient Rehabilitation Admissions Coordinator  I met with patient and her spouse at bedside. They are in agreement to admit to inpt rehab today. I will contact Dr. Grandville Silos to arrange admit. RN CM and SW made aware.   Danne Baxter, RN, MSN Rehab Admissions Coordinator [phone] 03/22/2018 2:01 PM

## 2018-03-22 NOTE — Progress Notes (Signed)
Report called to [facility] at this time

## 2018-03-22 NOTE — PMR Pre-admission (Signed)
PMR Admission Coordinator Pre-Admission Assessment  Patient: Hannah Valentine is an 62 y.o., female MRN: [id number] DOB: 1956/04/28 Height: 5\' 7"  (170.2 cm) Weight: 95 kg (209 lb 7 oz)              Insurance Information HMO:     PPO:      PCP:      IPA:      80/20:      OTHER: no HMO PRIMARY: Medicare a and b      Policy#: [id number]      Subscriber: pt Benefits:  Phone #: passport one online     Name: 03/19/18 Eff. Date: 08/06/2014     Deduct: $1364      Out of Pocket Max: none      Life Max: none CIR: 100%      SNF: 20 full days Outpatient: 80%     Co-Pay: 20% Home Health: 100%      Co-Pay: none DME: 80%     Co-Pay: 20% Providers: pt choice  SECONDARY: Sharen Counter      Policy#: [id number]      Subscriber: pt Medicaid Application Date:       Case Manager:  Disability Application Date:       Case Worker:   Emergency Contact Information Contact Information    Name Relation Home Work [place] Spouse [phone]  [phone]     Current Medical History  Patient Admitting Diagnosis: left tib-fib fracture  History of Present Illness:  HPI: Hannah Valentine is a 62 year old right-handed female with history of hypertension, diabetes mellitus with insulin pump, renal transplant 2017 with baseline serum creatinine 1.6-1.7 and has a left fistula.   Admitted 03/11/2018 with fall and subsequent left tibia-fibula fracture.  There was no loss of consciousness.  She was evaluated by Dr. [name] with orthopedic services and underwent ORIF left proximal tibia 03/12/2018.  non weightbearing with hinged knee brace.  Hospital course pain management.  Acute blood loss anemia and monitored.  Patient developed significant fevers with sweats and mild arthralgia.  Acute on chronic renal failure as well as poorly controlled blood sugars with signs of early DKA on 03/17/2018.  Her Lasix was placed on hold she was treated with gentle hydration renal status continued to improve followed by nephrology services.   Dr. Luciana Axe of infectious disease consulted for input in regards to fever work-up revealed urine positive CMV PCR which she was started on valganciclovir 03/16/2018 for CMV x21 days as well as Bactrim.  Blood culture showed no growth, lower extremity Dopplers negative.  Subcutaneous heparin for DVT prophylaxis. WOC follow-up 03/22/2018 for partial thickness non pressure area left posterior thigh with order for foam dressings.      Past Medical History  Past Medical History:  Diagnosis Date  . CKD (chronic kidney disease), stage III (HCC)   . Complication of anesthesia   . Essential hypertension   . HLD (hyperlipidemia)   . Kidney transplanted 2017  . PONV (postoperative nausea and vomiting)   . Type II diabetes mellitus with renal manifestations (HCC)     Family History  family history includes Breast cancer in her sister; CAD in her mother; Diabetes Mellitus I in her father; Heart attack in her brother and father; Hypertension in her father and sister; Lung cancer in her mother; Stroke in her father.  Prior Rehab/Hospitalizations:  Has the patient had major surgery during 100 days prior to admission? No  Current Medications   Current Facility-Administered Medications:  .  acetaminophen (TYLENOL) tablet 650 mg, 650 mg, Oral, Q4H PRN, Rodolph Bong, MD, 650 mg at 03/21/18 2054 .  amLODipine (NORVASC) tablet 10 mg, 10 mg, Oral, Daily, Rodolph Bong, MD, 10 mg at 03/22/18 0840 .  bisacodyl (DULCOLAX) suppository 10 mg, 10 mg, Rectal, Daily PRN, Rodolph Bong, MD .  doxazosin (CARDURA) tablet 4 mg, 4 mg, Oral, QPM, Rodolph Bong, MD, 4 mg at 03/22/18 0051 .  ferrous sulfate tablet 325 mg, 325 mg, Oral, Daily, Rodolph Bong, MD, 325 mg at 03/22/18 0840 .  heparin injection 5,000 Units, 5,000 Units, Subcutaneous, Q8H, Rodolph Bong, MD, 5,000 Units at 03/22/18 0549 .  hydrALAZINE (APRESOLINE) injection 10 mg, 10 mg, Intravenous, Q8H PRN, Rodolph Bong, MD, 10  mg at 03/16/18 0547 .  hydrALAZINE (APRESOLINE) tablet 100 mg, 100 mg, Oral, Q8H, Rodolph Bong, MD, 100 mg at 03/22/18 0547 .  insulin pump, , Subcutaneous, TID AC & HS, Rodolph Bong, MD .  isosorbide mononitrate (IMDUR) 24 hr tablet 60 mg, 60 mg, Oral, BID, Rodolph Bong, MD, 60 mg at 03/22/18 0840 .  metoprolol tartrate (LOPRESSOR) tablet 50 mg, 50 mg, Oral, BID, Rodolph Bong, MD, 50 mg at 03/22/18 0840 .  mycophenolate (MYFORTIC) EC tablet 180 mg, 180 mg, Oral, BID, 180 mg at 03/22/18 0933 **AND** [DISCONTINUED] mycophenolate (MYFORTIC) EC tablet 180 mg, 180 mg, Oral, QPM, Rodolph Bong, MD, 180 mg at 03/18/18 2102 .  ondansetron (ZOFRAN) injection 4 mg, 4 mg, Intravenous, Q6H PRN, Rodolph Bong, MD, 4 mg at 03/18/18 0981 .  oxyCODONE (Oxy IR/ROXICODONE) immediate release tablet 10-20 mg, 10-20 mg, Oral, Q4H PRN, Rodolph Bong, MD, 10 mg at 03/21/18 2054 .  polyethylene glycol (MIRALAX / GLYCOLAX) packet 17 g, 17 g, Oral, Daily PRN, Rodolph Bong, MD, 17 g at 03/14/18 1539 .  pravastatin (PRAVACHOL) tablet 40 mg, 40 mg, Oral, QHS, Rodolph Bong, MD, 40 mg at 03/21/18 2319 .  predniSONE (DELTASONE) tablet 5 mg, 5 mg, Oral, QAC breakfast, Rodolph Bong, MD, 5 mg at 03/22/18 0841 .  sulfamethoxazole-trimethoprim (BACTRIM,SEPTRA) 400-80 MG per tablet 1 tablet, 1 tablet, Oral, Q M,W,F, Rodolph Bong, MD, 1 tablet at 03/22/18 0841 .  tacrolimus (PROGRAF) capsule 2 mg, 2 mg, Oral, BID, Rodolph Bong, MD, 2 mg at 03/22/18 0933 .  valGANciclovir (VALCYTE) 450 MG tablet TABS 450 mg, 450 mg, Oral, BID, Rodolph Bong, MD, 450 mg at 03/22/18 0841  Patients Current Diet:  Diet Order           Diet regular Room service appropriate? Yes; Fluid consistency: Thin  Diet effective now          Precautions / Restrictions Precautions Precautions: Fall Precaution Comments: 5# LUE lifting limit due to fistula changes Other Brace/Splint: hinged  brace LLE (locked at 0), ortho PA reports brace can be unlocked when up and locked at night for sleeping.   Restrictions Weight Bearing Restrictions: Yes LLE Weight Bearing: Touchdown weight bearing Other Position/Activity Restrictions: see precautions re: LUE   Has the patient had 2 or more falls or a fall with injury in the past year?No  Prior Activity Level Community (5-7x/wk): Independent without AD pta; driving  Home Assistive Devices / Equipment Home Assistive Devices/Equipment: Other (Comment)(INSULIN PUMP) Home Equipment: None  Prior Device Use: Indicate devices/aids used by the patient prior to current illness, exacerbation or injury? None of the above  Prior Functional Level Prior Function  Level of Independence: Independent Comments: retired  Self Care: Did the patient need help bathing, dressing, using the toilet or eating?  Independent  Indoor Mobility: Did the patient need assistance with walking from room to room (with or without device)? Independent  Stairs: Did the patient need assistance with internal or external stairs (with or without device)? Independent  Functional Cognition: Did the patient need help planning regular tasks such as shopping or remembering to take medications? Independent  Current Functional Level Cognition  Overall Cognitive Status: Within Functional Limits for tasks assessed Orientation Level: Oriented X4 General Comments: wants to get her mobility to increase despite pain    Extremity Assessment (includes Sensation/Coordination)  Upper Extremity Assessment: RUE deficits/detail RUE Deficits / Details: Reports sore from fall  Lower Extremity Assessment: Defer to PT evaluation LLE Deficits / Details: in long leg hinged brace locked at 0 degrees; requires assist to raise leg off bed LLE: Unable to fully assess due to pain, Unable to fully assess due to immobilization    ADLs  Overall ADL's : Needs assistance/impaired Eating/Feeding:  Set up, Sitting Grooming: Set up, Sitting Upper Body Bathing: Sitting, Minimal assistance Lower Body Bathing: Maximal assistance, Sitting/lateral leans, Bed level Upper Body Dressing : Sitting, Set up, Supervision/safety Lower Body Dressing: Maximal assistance, Sitting/lateral leans, Bed level Toilet Transfer: Minimal assistance, +2 for safety/equipment Toilet Transfer Details (indicate cue type and reason): simulated with lateral scoot from bed to drop arm recliner going to pt's right. 1 to A with LLE and the other person for safety as pt scooted. Functional mobility during ADLs: Minimal assistance(lateral scoot ) General ADL Comments: patient motivated to participate despite decreased activity tolerance and recent fever; nursing assisted with management of L LE during functional transfer for pain management and safety     Mobility  Overal bed mobility: Needs Assistance Bed Mobility: Rolling Rolling: Min guard Supine to sit: Min assist, HOB elevated Sit to supine: Mod assist, +2 for safety/equipment General bed mobility comments: Min A to manage LLE     Transfers  Overall transfer level: Needs assistance Equipment used: None Transfers: Lateral/Scoot Transfers  Lateral/Scoot Transfers: Min assist, +2 safety/equipment General transfer comment: in bed and tired when PT arrived    Ambulation / Gait / Stairs / Engineer, drillingWheelchair Mobility       Posture / Balance Dynamic Sitting Balance Sitting balance - Comments: Able to maintain static sitting. Requiring elevation of LLE for pain management  Balance Overall balance assessment: Needs assistance Sitting-balance support: No upper extremity supported, Feet supported Sitting balance-Leahy Scale: Fair Sitting balance - Comments: Able to maintain static sitting. Requiring elevation of LLE for pain management     Special needs/care consideration BiPAP/CPAP n/a CPM  N/a Continuous Drip IV n/a Dialysis n/a; renal transplant Life Vest  N/a Oxygen   N/a Special Bed n/a Trach Size n/a Wound Vac n/a Skin  WOC  Consult Note  Addendum  Date of Service:  03/22/2018 11:37 AM               Show:Clear all [x] Manual[x] Template[] Copied  Added by: [x] Leanna BattlesEngels, Dawn O, RN   [] Hover for details   WOC Nurse wound consult note Reason for Consult: Consult requested for posterior thigh and left heel.  There are no wounds noted to sacrum or buttocks when these sites were assessed. Wound type: 2 partial thickness non-pressure areas to L posterior thigh - 100% pink with no drainage, 1cm x 1cm x 0.1cm and 2cm x 1cm x 0.1cm; appearance is consistent with  skin tears or shear. DTI to L outer heel - 100% purple intact skin, 3cm x 2cm; appearance and location is consistent with where the bone rubbed internally when the foot was rotated with the fracture.  There is generalized edema and bruising surrounding this location. Dressing procedure/placement/frequency: Foam dressings recommended for partial thickness areas on posterior left thigh. Keep left leg elevated to prevent pressure to left heel. Discussed plan of care with patient and husband at the bedside. Please re-consult if further assistance is needed.  Mardee Postin MSN, RN, CWOCN, [place], [place] [phone]           Bowel mgmt: continent ; LBM 6/12 Bladder mgmt: external catheter Diabetic mgmt yes; insulin pump   Previous Home Environment Living Arrangements: Spouse/significant other  Lives With: Spouse Available Help at Discharge: Family, Available 24 hours/day Type of Home: House Home Layout: (spit level; can stay downstairs and level entry throught the) Alternate Level Stairs-Rails: Right Alternate Level Stairs-Number of Steps: 12 Home Access: Elevator(level entry throught the garage) Bathroom Shower/Tub: Health visitor: Standard Bathroom Accessibility: (28 inches in door way to downstairs bathroom) Home Care Services: No Additional Comments:  does not have bedroom downstairs; would have to get hospital bed; uptairs in 5 steps to landing and then another 6  Discharge Living Setting Plans for Discharge Living Setting: Patient's home, Lives with (comment)(spouse) Type of Home at Discharge: House Discharge Home Layout: Multi-level(spilt level with bed/bath upstairs) Alternate Level Stairs-Rails: Right Alternate Level Stairs-Number of Steps: 5 steps and then another 6 to upstairs bed and bath Discharge Home Access: Level entry(through garage entry) Discharge Bathroom Shower/Tub: Walk-in shower Discharge Bathroom Toilet: Standard Discharge Bathroom Accessibility: (28 inch width to down stairs bath) Does the patient have any problems obtaining your medications?: No   Spouse states [employer] is her transportation home and would like transfer training to their specific vehicle  Social/Family/Support Systems Patient Roles: Spouse Contact Information: Hannah Valentine, spouse Anticipated Caregiver: spouse Anticipated Industrial/product designer Information: see above Ability/Limitations of Caregiver: spouse walks with a can and has back issues Caregiver Availability: 24/7 Discharge Plan Discussed with Primary Caregiver: Yes Is Caregiver In Agreement with Plan?: Yes Does Caregiver/Family have Issues with Lodging/Transportation while Pt is in Rehab?: No  Goals/Additional Needs Patient/Family Goal for Rehab: supervision to min assist with PT and OT Expected length of stay: ELOS 9 to 14 days Equipment Needs: patietn has insulin pump she manages Special Service Needs: Renal Transplant Pt/Family Agrees to Admission and willing to participate: Yes Program Orientation Provided & Reviewed with Pt/Caregiver Including Roles  & Responsibilities: Yes  Decrease burden of Care through IP rehab admission: n/a  Possible need for SNF placement upon discharge: not expected  Patient Condition: This patient's medical and functional status has changed since  the consult dated: 03/15/2018 in which the Rehabilitation Physician determined and documented that the patient's condition is appropriate for intensive rehabilitative care in an inpatient rehabilitation facility. See "History of Present Illness" (above) for medical update. Functional changes are: mod assist. Patient's medical and functional status update has been discussed with the Rehabilitation physician and patient remains appropriate for inpatient rehabilitation. Will admit to inpatient rehab today.  Preadmission Screen Completed By:  Clois Dupes, 03/22/2018 2:11 PM ______________________________________________________________________   Discussed status with Dr. Riley Kill on 03/22/2018 at  1420 and received telephone approval for admission today.  Admission Coordinator:  Clois Dupes, time 6962 Date 03/22/2018

## 2018-03-22 NOTE — Plan of Care (Signed)
  Problem: Education: Goal: Knowledge of General Education information will improve 03/22/2018 1543 by Darreld Mcleanox, Anihya Tuma, RN Outcome: Adequate for Discharge 03/22/2018 1138 by Darreld Mcleanox, Stalin Gruenberg, RN Outcome: Progressing

## 2018-03-22 NOTE — Plan of Care (Signed)
  Problem: Clinical Measurements: Goal: Ability to maintain clinical measurements within normal limits will improve Outcome: Progressing   Problem: Pain Managment: Goal: General experience of comfort will improve Outcome: Progressing   

## 2018-03-22 NOTE — Progress Notes (Signed)
Standley Brooking, RN  Rehab Admission Coordinator  Physical Medicine and Rehabilitation  PMR Pre-admission  Signed  Date of Service:  03/22/2018 2:11 PM       Related encounter: ED to Hosp-Admission (Current) from 03/11/2018 in [facility]      Signed           Show:Clear all [x] Manual[x] Template[x] Copied  Added by: [x] Standley Brooking, RN   [] Hover for details   PMR Admission Coordinator Pre-Admission Assessment  Patient: Hannah Valentine is an 62 y.o., female MRN: [id number] DOB: [birth year] Height: 5\' 7"  (170.2 cm) Weight: 95 kg (209 lb 7 oz)                                                                                                                                                  Insurance Information HMO:     PPO:      PCP:      IPA:      80/20:      OTHER: no HMO PRIMARY: Medicare a and b      Policy#: [id number]      Subscriber: pt Benefits:  Phone #: passport one online     Name: 03/19/18 Eff. Date: 08/06/2014     Deduct: $1364      Out of Pocket Max: none      Life Max: none CIR: 100%      SNF: 20 full days Outpatient: 80%     Co-Pay: 20% Home Health: 100%      Co-Pay: none DME: 80%     Co-Pay: 20% Providers: pt choice  SECONDARY: Sharen Counter      Policy#: [id number]      Subscriber: pt Medicaid Application Date:       Case Manager:  Disability Application Date:       Case Worker:   Emergency Contact Information Contact Information    Name Relation Home Work [place] Spouse [phone]  [phone]     Current Medical History  Patient Admitting Diagnosis: left tib-fib fracture  History of Present Illness:  XBM:WUXLK Lerougeis a 62 year old right-handed female with history of hypertension, diabetes mellitus with insulin pump, renal transplant 2017 with baseline serum creatinine 1.6-1.7 and has a left fistula. Admitted 03/11/2018 with fall and subsequent left tibia-fibula fracture.  There was no loss of consciousness. She was evaluated by Dr. [name] with orthopedic services and underwent ORIF left proximal tibia 03/12/2018. non weightbearing with hinged knee brace. Hospital course pain management. Acute blood loss anemia and monitored. Patient developed significant fevers with sweats and mild arthralgia. Acute on chronic renal failure as well as poorly controlled blood sugars with signs of early DKA on 03/17/2018. Her Lasix was placed on hold she was treated with gentle hydration renal status continued to improve followed by nephrology services. Dr. Luciana Axe  of infectious disease consulted for input in regards to fever work-up revealed urine positive CMV PCR which she wasstarted on valganciclovir6/08/2018 for CMV x21 days as well as Bactrim. Blood culture showed no growth, lower extremity Dopplers negative. Subcutaneous heparin for DVT prophylaxis. WOCfollow-up 03/22/2018 for partial thickness non pressure area left posterior thigh with order for foam dressings.   Past Medical History      Past Medical History:  Diagnosis Date  . CKD (chronic kidney disease), stage III (HCC)   . Complication of anesthesia   . Essential hypertension   . HLD (hyperlipidemia)   . Kidney transplanted 2017  . PONV (postoperative nausea and vomiting)   . Type II diabetes mellitus with renal manifestations (HCC)     Family History  family history includes Breast cancer in her sister; CAD in her mother; Diabetes Mellitus I in her father; Heart attack in her brother and father; Hypertension in her father and sister; Lung cancer in her mother; Stroke in her father.  Prior Rehab/Hospitalizations:  Has the patient had major surgery during 100 days prior to admission? No  Current Medications   Current Facility-Administered Medications:  .  acetaminophen (TYLENOL) tablet 650 mg, 650 mg, Oral, Q4H PRN, Rodolph Bong, MD, 650 mg at 03/21/18 2054 .  amLODipine (NORVASC) tablet  10 mg, 10 mg, Oral, Daily, Rodolph Bong, MD, 10 mg at 03/22/18 0840 .  bisacodyl (DULCOLAX) suppository 10 mg, 10 mg, Rectal, Daily PRN, Rodolph Bong, MD .  doxazosin (CARDURA) tablet 4 mg, 4 mg, Oral, QPM, Rodolph Bong, MD, 4 mg at 03/22/18 0051 .  ferrous sulfate tablet 325 mg, 325 mg, Oral, Daily, Rodolph Bong, MD, 325 mg at 03/22/18 0840 .  heparin injection 5,000 Units, 5,000 Units, Subcutaneous, Q8H, Rodolph Bong, MD, 5,000 Units at 03/22/18 0549 .  hydrALAZINE (APRESOLINE) injection 10 mg, 10 mg, Intravenous, Q8H PRN, Rodolph Bong, MD, 10 mg at 03/16/18 0547 .  hydrALAZINE (APRESOLINE) tablet 100 mg, 100 mg, Oral, Q8H, Rodolph Bong, MD, 100 mg at 03/22/18 0547 .  insulin pump, , Subcutaneous, TID AC & HS, Rodolph Bong, MD .  isosorbide mononitrate (IMDUR) 24 hr tablet 60 mg, 60 mg, Oral, BID, Rodolph Bong, MD, 60 mg at 03/22/18 0840 .  metoprolol tartrate (LOPRESSOR) tablet 50 mg, 50 mg, Oral, BID, Rodolph Bong, MD, 50 mg at 03/22/18 0840 .  mycophenolate (MYFORTIC) EC tablet 180 mg, 180 mg, Oral, BID, 180 mg at 03/22/18 0933 **AND** [DISCONTINUED] mycophenolate (MYFORTIC) EC tablet 180 mg, 180 mg, Oral, QPM, Rodolph Bong, MD, 180 mg at 03/18/18 2102 .  ondansetron (ZOFRAN) injection 4 mg, 4 mg, Intravenous, Q6H PRN, Rodolph Bong, MD, 4 mg at 03/18/18 1610 .  oxyCODONE (Oxy IR/ROXICODONE) immediate release tablet 10-20 mg, 10-20 mg, Oral, Q4H PRN, Rodolph Bong, MD, 10 mg at 03/21/18 2054 .  polyethylene glycol (MIRALAX / GLYCOLAX) packet 17 g, 17 g, Oral, Daily PRN, Rodolph Bong, MD, 17 g at 03/14/18 1539 .  pravastatin (PRAVACHOL) tablet 40 mg, 40 mg, Oral, QHS, Rodolph Bong, MD, 40 mg at 03/21/18 2319 .  predniSONE (DELTASONE) tablet 5 mg, 5 mg, Oral, QAC breakfast, Rodolph Bong, MD, 5 mg at 03/22/18 0841 .  sulfamethoxazole-trimethoprim (BACTRIM,SEPTRA) 400-80 MG per tablet 1 tablet, 1 tablet,  Oral, Q M,W,F, Rodolph Bong, MD, 1 tablet at 03/22/18 0841 .  tacrolimus (PROGRAF) capsule 2 mg, 2 mg, Oral, BID, Rodolph Bong,  MD, 2 mg at 03/22/18 0933 .  valGANciclovir (VALCYTE) 450 MG tablet TABS 450 mg, 450 mg, Oral, BID, Rodolph Bong, MD, 450 mg at 03/22/18 0841  Patients Current Diet:       Diet Order           Diet regular Room service appropriate? Yes; Fluid consistency: Thin  Diet effective now          Precautions / Restrictions Precautions Precautions: Fall Precaution Comments: 5# LUE lifting limit due to fistula changes Other Brace/Splint: hinged brace LLE (locked at 0), ortho PA reports brace can be unlocked when up and locked at night for sleeping.   Restrictions Weight Bearing Restrictions: Yes LLE Weight Bearing: Touchdown weight bearing Other Position/Activity Restrictions: see precautions re: LUE   Has the patient had 2 or more falls or a fall with injury in the past year?No  Prior Activity Level Community (5-7x/wk): Independent without AD pta; driving  Home Assistive Devices / Equipment Home Assistive Devices/Equipment: Other (Comment)(INSULIN PUMP) Home Equipment: None  Prior Device Use: Indicate devices/aids used by the patient prior to current illness, exacerbation or injury? None of the above  Prior Functional Level Prior Function Level of Independence: Independent Comments: retired  Self Care: Did the patient need help bathing, dressing, using the toilet or eating?  Independent  Indoor Mobility: Did the patient need assistance with walking from room to room (with or without device)? Independent  Stairs: Did the patient need assistance with internal or external stairs (with or without device)? Independent  Functional Cognition: Did the patient need help planning regular tasks such as shopping or remembering to take medications? Independent  Current Functional Level Cognition  Overall Cognitive Status:  Within Functional Limits for tasks assessed Orientation Level: Oriented X4 General Comments: wants to get her mobility to increase despite pain    Extremity Assessment (includes Sensation/Coordination)  Upper Extremity Assessment: RUE deficits/detail RUE Deficits / Details: Reports sore from fall  Lower Extremity Assessment: Defer to PT evaluation LLE Deficits / Details: in long leg hinged brace locked at 0 degrees; requires assist to raise leg off bed LLE: Unable to fully assess due to pain, Unable to fully assess due to immobilization    ADLs  Overall ADL's : Needs assistance/impaired Eating/Feeding: Set up, Sitting Grooming: Set up, Sitting Upper Body Bathing: Sitting, Minimal assistance Lower Body Bathing: Maximal assistance, Sitting/lateral leans, Bed level Upper Body Dressing : Sitting, Set up, Supervision/safety Lower Body Dressing: Maximal assistance, Sitting/lateral leans, Bed level Toilet Transfer: Minimal assistance, +2 for safety/equipment Toilet Transfer Details (indicate cue type and reason): simulated with lateral scoot from bed to drop arm recliner going to pt's right. 1 to A with LLE and the other person for safety as pt scooted. Functional mobility during ADLs: Minimal assistance(lateral scoot ) General ADL Comments: patient motivated to participate despite decreased activity tolerance and recent fever; nursing assisted with management of L LE during functional transfer for pain management and safety     Mobility  Overal bed mobility: Needs Assistance Bed Mobility: Rolling Rolling: Min guard Supine to sit: Min assist, HOB elevated Sit to supine: Mod assist, +2 for safety/equipment General bed mobility comments: Min A to manage LLE     Transfers  Overall transfer level: Needs assistance Equipment used: None Transfers: Lateral/Scoot Transfers  Lateral/Scoot Transfers: Min assist, +2 safety/equipment General transfer comment: in bed and tired when PT  arrived    Ambulation / Gait / Stairs / Psychologist, prison and probation services  Posture / Balance Dynamic Sitting Balance Sitting balance - Comments: Able to maintain static sitting. Requiring elevation of LLE for pain management  Balance Overall balance assessment: Needs assistance Sitting-balance support: No upper extremity supported, Feet supported Sitting balance-Leahy Scale: Fair Sitting balance - Comments: Able to maintain static sitting. Requiring elevation of LLE for pain management     Special needs/care consideration BiPAP/CPAP n/a CPM  N/a Continuous Drip IV n/a Dialysis n/a; renal transplant Life Vest  N/a Oxygen  N/a Special Bed n/a Trach Size n/a Wound Vac n/a Skin     WOC  Consult Note   Addendum   Date of Service:  03/22/2018 11:37 AM                 Show:Clear all [x] Manual[x] Template[] Copied  Added by: [x] Leanna Battles, RN   [] Hover for details   WOC Nurse wound consult note Reason for Consult:Consult requested for posterior thigh and left heel. There areno wounds noted to sacrum or buttocks when these sites were assessed. Wound type:2 partial thickness non-pressure areas to L posterior thigh - 100% pink with no drainage, 1cm x 1cm x 0.1cm and 2cm x 1cm x 0.1cm; appearance is consistent with skin tears or shear. DTI to L outer heel - 100% purple intact skin, 3cm x 2cm; appearance and location is consistent with where the bone rubbed internally when the foot was rotated with the fracture. There is generalized edema and bruising surrounding this location. Dressing procedure/placement/frequency:Foam dressings recommended for partial thickness areas on posterior left thigh. Keep left leg elevated to prevent pressure to left heel. Discussed plan of care with patient and husband at the bedside. Please re-consult if further assistance is needed. Mardee Postin MSN, RN, CWOCN, [place], [place] [phone]           Bowel mgmt:  continent ; LBM 6/12 Bladder mgmt: external catheter Diabetic mgmt yes; insulin pump   Previous Home Environment Living Arrangements: Spouse/significant other  Lives With: Spouse Available Help at Discharge: Family, Available 24 hours/day Type of Home: House Home Layout: (spit level; can stay downstairs and level entry throught the) Alternate Level Stairs-Rails: Right Alternate Level Stairs-Number of Steps: 12 Home Access: Elevator(level entry throught the garage) Bathroom Shower/Tub: Health visitor: Standard Bathroom Accessibility: (28 inches in door way to downstairs bathroom) Home Care Services: No Additional Comments: does not have bedroom downstairs; would have to get hospital bed; uptairs in 5 steps to landing and then another 6  Discharge Living Setting Plans for Discharge Living Setting: Patient's home, Lives with (comment)(spouse) Type of Home at Discharge: House Discharge Home Layout: Multi-level(spilt level with bed/bath upstairs) Alternate Level Stairs-Rails: Right Alternate Level Stairs-Number of Steps: 5 steps and then another 6 to upstairs bed and bath Discharge Home Access: Level entry(through garage entry) Discharge Bathroom Shower/Tub: Walk-in shower Discharge Bathroom Toilet: Standard Discharge Bathroom Accessibility: (28 inch width to down stairs bath) Does the patient have any problems obtaining your medications?: No   Spouse states [employer] is her transportation home and would like transfer training to their specific vehicle  Social/Family/Support Systems Patient Roles: Spouse Contact Information: Mathis Fare, spouse Anticipated Caregiver: spouse Anticipated Industrial/product designer Information: see above Ability/Limitations of Caregiver: spouse walks with a can and has back issues Caregiver Availability: 24/7 Discharge Plan Discussed with Primary Caregiver: Yes Is Caregiver In Agreement with Plan?: Yes Does Caregiver/Family have  Issues with Lodging/Transportation while Pt is in Rehab?: No  Goals/Additional Needs Patient/Family Goal for Rehab: supervision to min assist  with PT and OT Expected length of stay: ELOS 9 to 14 days Equipment Needs: patietn has insulin pump she manages Special Service Needs: Renal Transplant Pt/Family Agrees to Admission and willing to participate: Yes Program Orientation Provided & Reviewed with Pt/Caregiver Including Roles  & Responsibilities: Yes  Decrease burden of Care through IP rehab admission: n/a  Possible need for SNF placement upon discharge: not expected  Patient Condition: This patient's medical and functional status has changed since the consult dated: 03/15/2018 in which the Rehabilitation Physician determined and documented that the patient's condition is appropriate for intensive rehabilitative care in an inpatient rehabilitation facility. See "History of Present Illness" (above) for medical update. Functional changes are: mod assist. Patient's medical and functional status update has been discussed with the Rehabilitation physician and patient remains appropriate for inpatient rehabilitation. Will admit to inpatient rehab today.  Preadmission Screen Completed By:  Clois DupesBoyette, Ligia Duguay Godwin, 03/22/2018 2:11 PM ______________________________________________________________________   Discussed status with Dr. Riley KillSwartz on 03/22/2018 at  1420 and received telephone approval for admission today.  Admission Coordinator:  Clois DupesBoyette, Kahliyah Dick Godwin, time [id number] Date 03/22/2018             Cosigned by: Ranelle OysterSwartz, Zachary T, MD at 03/22/2018 3:09 PM  Revision History

## 2018-03-22 NOTE — Plan of Care (Signed)
  Problem: Education: Goal: Knowledge of General Education information will improve Outcome: Progressing   

## 2018-03-23 ENCOUNTER — Inpatient Hospital Stay (HOSPITAL_COMMUNITY): Payer: Medicare Other | Admitting: Occupational Therapy

## 2018-03-23 ENCOUNTER — Inpatient Hospital Stay (HOSPITAL_COMMUNITY): Payer: Medicare Other | Admitting: Physical Therapy

## 2018-03-23 ENCOUNTER — Inpatient Hospital Stay (HOSPITAL_COMMUNITY): Payer: Medicare Other

## 2018-03-23 DIAGNOSIS — S82209S Unspecified fracture of shaft of unspecified tibia, sequela: Secondary | ICD-10-CM

## 2018-03-23 DIAGNOSIS — B258 Other cytomegaloviral diseases: Secondary | ICD-10-CM

## 2018-03-23 DIAGNOSIS — S82409S Unspecified fracture of shaft of unspecified fibula, sequela: Secondary | ICD-10-CM

## 2018-03-23 LAB — CBC WITH DIFFERENTIAL/PLATELET
Abs Immature Granulocytes: 0.1 10*3/uL (ref 0.0–0.1)
BASOS PCT: 1 %
Basophils Absolute: 0.1 10*3/uL (ref 0.0–0.1)
EOS ABS: 0.2 10*3/uL (ref 0.0–0.7)
EOS PCT: 5 %
HCT: 31.4 % — ABNORMAL LOW (ref 36.0–46.0)
Hemoglobin: 10 g/dL — ABNORMAL LOW (ref 12.0–15.0)
Immature Granulocytes: 2 %
Lymphocytes Relative: 22 %
Lymphs Abs: 1.2 10*3/uL (ref 0.7–4.0)
MCH: 30.8 pg (ref 26.0–34.0)
MCHC: 31.8 g/dL (ref 30.0–36.0)
MCV: 96.6 fL (ref 78.0–100.0)
MONO ABS: 0.6 10*3/uL (ref 0.1–1.0)
Monocytes Relative: 11 %
Neutro Abs: 3.2 10*3/uL (ref 1.7–7.7)
Neutrophils Relative %: 59 %
PLATELETS: 261 10*3/uL (ref 150–400)
RBC: 3.25 MIL/uL — ABNORMAL LOW (ref 3.87–5.11)
RDW: 14.6 % (ref 11.5–15.5)
WBC: 5.4 10*3/uL (ref 4.0–10.5)

## 2018-03-23 LAB — RENAL FUNCTION PANEL
Albumin: 2.4 g/dL — ABNORMAL LOW (ref 3.5–5.0)
Anion gap: 8 (ref 5–15)
BUN: 26 mg/dL — ABNORMAL HIGH (ref 6–20)
CALCIUM: 8.4 mg/dL — AB (ref 8.9–10.3)
CO2: 23 mmol/L (ref 22–32)
CREATININE: 1.44 mg/dL — AB (ref 0.44–1.00)
Chloride: 106 mmol/L (ref 101–111)
GFR calc Af Amer: 44 mL/min — ABNORMAL LOW (ref >60)
GFR calc non Af Amer: 38 mL/min — ABNORMAL LOW (ref >60)
GLUCOSE: 135 mg/dL — AB (ref 65–99)
Phosphorus: 3.3 mg/dL (ref 2.5–4.6)
Potassium: 3.6 mmol/L (ref 3.5–5.1)
SODIUM: 137 mmol/L (ref 135–145)

## 2018-03-23 LAB — GLUCOSE, CAPILLARY
GLUCOSE-CAPILLARY: 58 mg/dL — AB (ref 65–99)
Glucose-Capillary: 159 mg/dL — ABNORMAL HIGH (ref 65–99)
Glucose-Capillary: 127 mg/dL — ABNORMAL HIGH (ref 65–99)
Glucose-Capillary: 142 mg/dL — ABNORMAL HIGH (ref 65–99)

## 2018-03-23 NOTE — Progress Notes (Signed)
Patient information reviewed and entered into eRehab system by Whittney Steenson, RN, CRRN, PPS Coordinator.  Information including medical coding and functional independence measure will be reviewed and updated through discharge.     Per nursing patient was given "Data Collection Information Summary for Patients in Inpatient Rehabilitation Facilities with attached "Privacy Act Statement-Health Care Records" upon admission.  

## 2018-03-23 NOTE — H&P (Signed)
Physical Medicine and Rehabilitation Admission H&P     Chief Complaint  Patient presents with  . Leg Injury  :  HPI: Hannah Valentine is a 62 year old right-handed female with history of hypertension, diabetes mellitus with insulin pump, renal transplant 2017 with baseline serum creatinine 1.6-1.7 and has a left fistula. Per chart review patient lives with spouse. Independent prior to admission. Multilevel home with bedroom upstairs. Admitted 03/11/2018 with fall and subsequent left tibia-fibula fracture. There was no loss of consciousness. She was evaluated by Dr. [name] with orthopedic services and underwent ORIF left proximal tibia 03/12/2018. Non-weightbearing with hinged knee brace. Hospital course pain management. Acute blood loss anemia and monitored. Patient developed significant fevers with sweats and mild arthralgia. Acute on chronic renal failure as well as poorly controlled blood sugars with signs of early DKA on 03/17/2018. Her Lasix was placed on hold she was treated with gentle hydration renal status continued to improve followed by nephrology services. Dr. Earle Gell of infectious disease consulted for input in regards to fever work-up revealed urine positive CMV PCR which she was started on valganciclovir 03/16/2018 for CMV x21 days as well as Bactrim. Blood culture showed no growth, lower extremity Dopplers negative. Subcutaneous heparin for DVT prophylaxis. WOC follow-up 03/22/2018 for partial thickness nonpressure area left posterior thigh with order for foam dressings. Physical and occupational therapy evaluations completed with recommendations of physical medicine rehab consult. Patient was admitted for a comprehensive rehab program.  Review of Systems  Constitutional: Positive for fever.  HENT: Negative for hearing loss.  Eyes: Negative for blurred vision and double vision.  Respiratory: Negative for cough and shortness of breath.  Cardiovascular: Positive for leg swelling. Negative for chest  pain and palpitations.  Gastrointestinal: Positive for constipation. Negative for nausea and vomiting.  Genitourinary: Negative for dysuria and hematuria.  Musculoskeletal: Positive for joint pain.  Skin: Negative for rash.  All other systems reviewed and are negative.       Past Medical History:  Diagnosis Date  . CKD (chronic kidney disease), stage III (HCC)   . Complication of anesthesia   . Essential hypertension   . HLD (hyperlipidemia)   . Kidney transplanted 2017  . PONV (postoperative nausea and vomiting)   . Type II diabetes mellitus with renal manifestations [facility])         Past Surgical History:  Procedure Laterality Date  . NEPHRECTOMY TRANSPLANTED ORGAN    . OPEN REDUCTION INTERNAL FIXATION (ORIF) TIBIA/FIBULA FRACTURE Left 03/12/2018   Procedure: OPEN REDUCTION INTERNAL FIXATION (ORIF) TIBIA/FIBULA FRACTURE; Surgeon: Roby Lofts, MD; Location: [facility] OR; Service: Orthopedics; Laterality: Left;  . ORIF TIBIA FRACTURE Left 03/12/2018        Family History  Problem Relation Age of Onset  . CAD Mother   . Lung cancer Mother   . Stroke Father   . Heart attack Father   . Diabetes Mellitus I Father   . Hypertension Father   . Breast cancer Sister   . Hypertension Sister   . Heart attack Brother    Social History: reports that she has quit smoking. She has never used smokeless tobacco. She reports that she does not drink alcohol or use drugs.  Allergies:       Allergies  Allergen Reactions  . Atorvastatin Other (See Comments)    Muscle weakness  . Hydrocodone-Acetaminophen Nausea Only  . Labetalol Other (See Comments)    "makes her feel bad"  . Meperidine Nausea Only  . Pregabalin Other (See Comments)  dizzy         Medications Prior to Admission  Medication Sig Dispense Refill  . amLODipine (NORVASC) 5 MG tablet Take 5 mg by mouth daily.  2  . aspirin EC 81 MG tablet Take 81 mg by mouth at bedtime.     . Cholecalciferol (VITAMIN D) 2000 units tablet Take  5,000 Units by mouth daily.     Marland Kitchen. doxazosin (CARDURA) 4 MG tablet Take 4 mg by mouth every evening.  3  . ferrous sulfate 325 (65 FE) MG EC tablet Take 325 mg by mouth daily.    . furosemide (LASIX) 80 MG tablet Take 80 mg by mouth daily.  3  . hydrALAZINE (APRESOLINE) 100 MG tablet Take 100 mg by mouth 3 (three) times daily.  3  . Insulin Human (INSULIN PUMP) SOLN Inject into the skin continuous. Novolog    . isosorbide mononitrate (IMDUR) 60 MG 24 hr tablet Take 60 mg by mouth 2 (two) times daily.    . metoprolol tartrate (LOPRESSOR) 25 MG tablet Take 50 mg by mouth 2 (two) times daily.    . mycophenolate (MYFORTIC) 180 MG EC tablet Take 180-360 mg by mouth See admin instructions. Take 360 mg every morning and take 180 mg every evening    . pravastatin (PRAVACHOL) 40 MG tablet Take 40 mg by mouth at bedtime.   3  . predniSONE (DELTASONE) 5 MG tablet Take 5 mg by mouth daily.    Marland Kitchen. sulfamethoxazole-trimethoprim (BACTRIM,SEPTRA) 400-80 MG tablet Take 1 tablet by mouth every Monday, Wednesday, and Friday.  2  . tacrolimus (PROGRAF) 1 MG capsule Take 2 mg by mouth 2 (two) times daily.      Drug Regimen Review  Drug regimen was reviewed and remains appropriate with no significant issues identified  Home:  Home Living  Family/patient expects to be discharged to:: Private residence  Living Arrangements: Spouse/significant other  Available Help at Discharge: Family, Available 24 hours/day  Type of Home: House  Home Access: Level entry(into garage )  Home Layout: Multi-level(garage level has full bathroom but no bed)  Alternate Level Stairs-Number of Steps: 12  Alternate Level Stairs-Rails: Right  Bathroom Shower/Tub: Engineer, miningWalk-in shower  Bathroom Accessibility: (not sure; entrance to bathroom in garage may be tricky)  Home Equipment: None  Functional History:  Prior Function  Level of Independence: Independent  Comments: retired  Functional Status:  Mobility:  Bed Mobility  Overal bed  mobility: Needs Assistance  Bed Mobility: Rolling  Rolling: Min guard  Supine to sit: Min assist, HOB elevated  Sit to supine: Mod assist, +2 for safety/equipment  General bed mobility comments: Min A to manage LLE  Transfers  Overall transfer level: Needs assistance  Equipment used: None  Transfers: Lateral/Scoot Transfers  Lateral/Scoot Transfers: Min assist, +2 safety/equipment  General transfer comment: in bed and tired when PT arrived    ADL:  ADL  Overall ADL's : Needs assistance/impaired  Eating/Feeding: Set up, Sitting  Grooming: Set up, Sitting  Upper Body Bathing: Sitting, Minimal assistance  Lower Body Bathing: Maximal assistance, Sitting/lateral leans, Bed level  Upper Body Dressing : Sitting, Set up, Supervision/safety  Lower Body Dressing: Maximal assistance, Sitting/lateral leans, Bed level  Toilet Transfer: Minimal assistance, +2 for safety/equipment  Toilet Transfer Details (indicate cue type and reason): simulated with lateral scoot from bed to drop arm recliner going to pt's right. 1 to A with LLE and the other person for safety as pt scooted.  Functional mobility during ADLs: Minimal assistance(lateral  scoot )  General ADL Comments: patient motivated to participate despite decreased activity tolerance and recent fever; nursing assisted with management of L LE during functional transfer for pain management and safety  Cognition:  Cognition  Overall Cognitive Status: Within Functional Limits for tasks assessed  Orientation Level: Oriented X4  Cognition  Arousal/Alertness: Awake/alert  Behavior During Therapy: WFL for tasks assessed/performed  Overall Cognitive Status: Within Functional Limits for tasks assessed  General Comments: wants to get her mobility to increase despite pain  Physical Exam:  Blood pressure (!) 155/55, pulse 63, temperature 98.6 F (37 C), temperature source Oral, resp. rate 20, height 5\' 7"  (1.702 m), weight 95 kg (209 lb 7 oz), SpO2 95 %.   Physical Exam  Constitutional: She is oriented to person, place, and time. She appears well-developed and well-nourished.  HENT:  Head: Normocephalic.  Eyes: Pupils are equal, round, and reactive to light. EOM are normal. No scleral icterus.  Neck: Normal range of motion. No tracheal deviation present. No thyromegaly present.  Cardiovascular: Normal rate and regular rhythm. Exam reveals no friction rub.  No murmur heard.  Respiratory: Effort normal. No respiratory distress. She has no wheezes.  GI: Soft. She exhibits no distension. There is no tenderness. There is no rebound.  Musculoskeletal: She exhibits edema.  Tender along right hip/inguinal area, non-specific  Neurological: She is alert and oriented to person, place, and time.  UE 5/5. RLE: 4/5. LLE: limited by pain 1-2/5, wiggles toes. . No sensory findings  Skin:  Scattered wounds, incisions LLE  Psychiatric: She has a normal mood and affect. Her behavior is normal.   Lab Results Last 48 Hours  Imaging Results (Last 48 hours)     Medical Problem List and Plan:  1. Decreased functional mobility secondary to left tibia-fibula fracture after fall status post ORIF 03/12/2018. Non-weightbearing with hinged knee brace.  -admit to inpatient rehab  2. DVT Prophylaxis/Anticoagulation: Subcutaneous heparin. Venous Doppler studies negative  3. Pain Management: Oxycodone as needed    4. Mood: Provide emotional support  5. Neuropsych: This patient is capable of making decisions on her own behalf.  6. Skin/Wound Care: Routine skin checks  7. Fluids/Electrolytes/Nutrition: Routine in and outs with follow-up chemistries  8. Acute on chronic anemia. Follow-up CBC. Continue iron supplement  9.Marland KitchenID/CMV infection. Continue Valganciclovir per infectious disease as well as Bactrim x21 days. Follow-up transplant nephrologist as outpatient  10. Diabetes mellitus. Patient with insulin pump. Diabetic coordinator follow-up. Hemoglobin A1c 5.8.  -sugars under reasonable control at present  11. CKD stage III with history of kidney transplant 2017. Follow-up chemistries. Continue Prograf and chronic prednisone  -Cr trending down 1.44 today  12. Hypertension. Norvasc 10 mg daily, Cardura 4 mg daily, hydralazine 100 mg every 8 hours, imdur 60 mg twice daily, Lopressor 50 mg twice daily.  -fair control at present  13. Hyperlipidemia. Pravachol  14. Constipation. Laxative assistance    Post Admission Physician Evaluation:  1. Functional deficits secondary to left tib-fib fx. 2. Patient is admitted to receive collaborative, interdisciplinary care between the physiatrist, rehab nursing staff, and therapy team. 3. Patient's level of medical complexity and substantial therapy needs in context of that medical necessity cannot be provided at a lesser intensity of care such as a SNF. 4. Patient has experienced substantial functional loss from his/her baseline which was documented above under the "Functional History" and "Functional Status" headings. Judging by the patient's diagnosis, physical exam, and functional history, the patient has potential for functional progress which will result in measurable gains while on inpatient rehab. These gains will be of substantial and practical use upon discharge in facilitating mobility and self-care at the household level. 5. Physiatrist will provide 24 hour  management of medical needs as well as oversight of the therapy plan/treatment and provide guidance as appropriate regarding the interaction of the two. 6. The Preadmission Screening has been reviewed and patient status is unchanged unless otherwise stated above. 7. 24 hour rehab nursing will assist with bladder management, bowel management, safety, skin/wound care, disease management, medication administration, pain management and patient education and help integrate therapy concepts, techniques,education, etc. 8. PT will assess and treat for/with: Lower extremity strength, range of motion, stamina, balance, functional mobility, safety, adaptive techniques and equipment, pain mgt, orthor precautions. Goals are: mod I to supervision. 9. OT will assess and treat for/with: ADL's, functional mobility, safety, upper extremity strength, adaptive techniques and equipment, pain mgt, ortho precautions, community reentry. Goals are: mod I to min assist. Therapy may proceed with showering this patient if leg is covered. 10. Case Management and Social Worker will assess and treat for psychological issues and discharge planning. 11. Team conference will be held weekly to assess progress toward goals and to determine barriers to discharge. 12. Patient will receive at least 3 hours of therapy per day at least 5 days per week. 13. ELOS: 9-14 days  14. Prognosis: excellent    I have personally performed a face to face diagnostic evaluation of this patient and formulated the key components of the plan. Additionally, I have personally reviewed laboratory data, imaging studies, as well as relevant notes and concur with the physician assistant's documentation  above.  Ranelle Oyster, MD, FAAPMR  Mcarthur Rossetti Angiulli, PA-C  03/22/2018

## 2018-03-23 NOTE — Progress Notes (Signed)
Social Work  Social Work Assessment and Plan  Patient Details  Name: Hannah Valentine MRN: [id number] Date of Birth: 06/10/1956  Today's Date: 03/23/2018  Problem List:  Patient Active Problem List   Diagnosis Date Noted  . CMV infection, acute (HCC) 03/22/2018  . Tibia/fibula fracture 03/22/2018  . Pressure injury of skin 03/20/2018  . Fever   . Trauma   . FUO (fever of unknown origin)   . Hypertensive crisis   . Prediabetes   . Acute blood loss anemia   . Closed fracture of upper end of left fibula   . Left tibial fracture 03/11/2018  . Hypoglycemia 10/04/2017  . Fall 10/04/2017  . SAH (subarachnoid hemorrhage) (HCC) 10/04/2017  . DM (diabetes mellitus), type 1 (HCC) 10/04/2017  . Essential hypertension   . HLD (hyperlipidemia)   . CKD (chronic kidney disease), stage III (HCC)   . Closed fracture of left orbital floor (HCC)   . Closed fracture of maxillary sinus (HCC)   . Syncope   . History of kidney transplant 10/07/2015   Past Medical History:  Past Medical History:  Diagnosis Date  . CKD (chronic kidney disease), stage III (HCC)   . Complication of anesthesia   . Essential hypertension   . HLD (hyperlipidemia)   . Kidney transplanted 2017  . PONV (postoperative nausea and vomiting)   . Type II diabetes mellitus with renal manifestations [facility](HCC)    Past Surgical History:  Past Surgical History:  Procedure Laterality Date  . NEPHRECTOMY TRANSPLANTED ORGAN    . OPEN REDUCTION INTERNAL FIXATION (ORIF) TIBIA/FIBULA FRACTURE Left 03/12/2018   Procedure: OPEN REDUCTION INTERNAL FIXATION (ORIF) TIBIA/FIBULA FRACTURE;  Surgeon: Roby LoftsHaddix, Kevin P, MD;  Location: [facility] OR;  Service: Orthopedics;  Laterality: Left;  . ORIF TIBIA FRACTURE Left 03/12/2018   Social History:  reports that she has quit smoking. She has never used smokeless tobacco. She reports that she does not drink alcohol or use drugs.  Family / Support Systems Marital Status: Married Patient Roles:  Spouse Spouse/Significant Other: Mathis Farelbert [phone]  [phone] Other Supports: Friends and church members Anticipated Caregiver: husband Ability/Limitations of Caregiver: husband has back issues and ambulates with a cane Caregiver Availability: 24/7 Family Dynamics: Close knit with husband and friends, she is one who has always taken care of herself and plans to do again from here. She feels she can learn to move with her WB restrictions.  Social History Preferred language: English Religion:  Cultural Background: No issues Education: Automotive engineerCollege educated Read: Yes Write: Yes Employment Status: Disabled Fish farm managerLegal Hisotry/Current Legal Issues: No issues Guardian/Conservator: none-according to MD pt is capable of making her own decisions while here   Abuse/Neglect Abuse/Neglect Assessment Can Be Completed: Yes Physical Abuse: Denies Verbal Abuse: Denies Sexual Abuse: Denies Exploitation of patient/patient's resources: Denies Self-Neglect: Denies  Emotional Status Pt's affect, behavior adn adjustment status: Pt is motivated to do well and realizes she needs to be patient with herself and her WB issues. She has always been able to make adjustments and even with her health issues been independent. She states: " Sometimes you have do things differently." Recent Psychosocial Issues: other health issues-renal transplant 06/2016 Pyschiatric History: No history deferred depression screen due to pt is coping appropriately and able to verbalize her feelings and concerns. Substance Abuse History: No issues-history  Patient / Family Perceptions, Expectations & Goals Pt/Family understanding of illness & functional limitations: Pt is able to explain her fracture and WB issues. She talks with her MD daily and feels she  has a good understanding of her condition and treatment plan going forward. Premorbid pt/family roles/activities: Wife, retiree, church member, friend, transplant pt,  etc Anticipated changes in roles/activities/participation: resume Pt/family expectations/goals: Pt states: " I plan to learn all that I can here and be as independent as possible before I leave here."  husband states: " I will help her, but she wants to do on her own, she is stubborn."  [employer]: Other (Comment)(Followed closely due to transplant pt) Premorbid Home Care/DME Agencies: None Transportation available at discharge: Husband  Discharge Planning Living Arrangements: Spouse/significant other Support Systems: Spouse/significant other, Friends/neighbors, Psychologist, clinical community Type of Residence: Private residence Insurance Resources: [employer], Media planner (specify)(BCBS) Financial Resources: SSD, Family Support Financial Screen Referred: No Living Expenses: Own Money Management: Spouse, Patient Does the patient have any problems obtaining your medications?: No Home Management: Pt and husband share them Patient/Family Preliminary Plans: Return home with husband who can provide assist if needed, but somewhat limited due to his own back issues. Will await therapy evaluations and work on discharge needs. Sw Barriers to Discharge: Decreased caregiver support Sw Barriers to Discharge Comments: husband has a back issue Social Work Anticipated Follow Up Needs: HH/OP  Clinical Impression Pleasant lady who is motivated and feels she will do her best to try to be mod/i prior to discharge home but does realize she may need assist due to her WB issues. Will await evaluations and work on her discharge needs.  Lucy Chris 03/23/2018, 9:33 AM

## 2018-03-23 NOTE — Progress Notes (Signed)
Occupational Therapy Session Note  Patient Details  Name: Hannah Valentine MRN: [id number] Date of Birth: 04/06/1956  Today's Date: 03/23/2018 OT Individual Time: 1305-1350 OT Individual Time Calculation (min): 45 min    Short Term Goals: Week 1:  OT Short Term Goal 1 (Week 1): STG = LTGs due to ELOS  Skilled Therapeutic Interventions/Progress Updates:  Treatment session with focus on sit > stand and stand pivot transfers.  Pt received asleep in recliner, reporting feeling "out of it" and intermittent light headedness with mobility, questioning strength of pain meds given this AM.  Pt able to maintain arousal and participation during session.  Completed sit > stand x5 from recliner with focus on hand placement and NWB through LLE during transitional movements.  Pt reports pain in Rt wrist, questioning if she may have also hurt it during her fall.  Change of UE placement resulted in decreased pain.  [facility] with min assist.  Pt unable to void despite reporting urge to go.  Returned to recliner and left upright with legs elevated and all needs in reach.  Therapy Documentation Precautions:  Precautions Precautions: Fall Required Braces or Orthoses: Other Brace/Splint Other Brace/Splint: hinged brace LLE (locked at 0), ortho PA reports brace can be unlocked when up and locked at night for sleeping.   Restrictions Weight Bearing Restrictions: Yes LLE Weight Bearing: Non weight bearing Pain:  Pt reports pain 1/10.  Premedicated.  See Function Navigator for Current Functional Status.   Therapy/Group: Individual Therapy  Rosalio LoudHOXIE, Jecenia Leamer 03/23/2018, 2:44 PM

## 2018-03-23 NOTE — Progress Notes (Signed)
Physical Therapy Session Note  Patient Details  Name: Hannah Valentine MRN: [id number] Date of Birth: July 18, 1956  Today's Date: 03/23/2018 PT Individual Time: 1003-1030 PT Individual Time Calculation (min): 27 min   Short Term Goals: Week 1:  PT Short Term Goal 1 (Week 1): STG=LTG  Skilled Therapeutic Interventions/Progress Updates: Pt presented in recliner agreeable to therapy. Pt indicated 4/10 pain presently as received pain meds and ice pack was present.  Session focused on pt edu for ROM and stretching. PTA performed manual ROM and retrograde massage x 10 min to L ankle to encourage increased ROM. Pt instructed in use of gait belt for heel cord stretching and PROM of ankle. Pt able to demonstrate with good technique. Pt able to actively perofrm DF/PF however still unable to reach neutral. Pt adv to continue performing throughout day to tolerance. Pt remained in recliner at end of session with call bell within reach and needs met.      Therapy Documentation Precautions:  Precautions Precautions: Fall Required Braces or Orthoses: Other Brace/Splint Other Brace/Splint: hinged brace LLE (locked at 0), ortho PA reports brace can be unlocked when up and locked at night for sleeping.   Restrictions Weight Bearing Restrictions: Yes LLE Weight Bearing: Non weight bearing General:   Vital Signs: Therapy Vitals Temp: 98 F (36.7 C) Temp Source: Oral Pulse Rate: (!) 57 Resp: (!) 21 BP: 133/60 Patient Position (if appropriate): Sitting Oxygen Therapy SpO2: 97 % O2 Device: Room Air   See Function Navigator for Current Functional Status.   Therapy/Group: Individual Therapy  Emry Tobin  Zeppelin Beckstrand, PTA  03/23/2018, 3:49 PM

## 2018-03-23 NOTE — Progress Notes (Addendum)
Inpatient Diabetes Program Recommendations  AACE/ADA: New Consensus Statement on Inpatient Glycemic Control (2015)  Target Ranges:  Prepandial:   less than 140 mg/dL      Peak postprandial:   less than 180 mg/dL (1-2 hours)      Critically ill patients:  140 - 180 mg/dL   Lab Results  Component Value Date   GLUCAP 159 (H) 03/23/2018   HGBA1C 5.8 (H) 03/11/2018    Home Diabetes medications: Insulin pump: 630 G Basal Rate: Current Adjustment  00:00 0.825 02:00 am 0.65 0.5  05:00 am 0.575 06:00 am 0.7 08:30 am 1.35 02:00 pm 1.4 05:00 pm 1.10 09:00 pm 1.05  Carb Ratio:  00:00 16  12:00 pm 17   Insulin Sensitivity:  00:00 50  10:00 am 50 60  06:00 pm 50   BG Target:  00:00 120   Current orders for Inpatient glycemic control: Insulin pump- See settings above  Inpatient Diabetes Program Recommendations:    Note patient has insulin pump and Dexcom CGM. Received consult for patient this AM. Noted that patient has been seen by diabetes coordinator this admission. Noted hypoglycemic episode of 52 mg/dL, then following charted that patient self administered insulin pump bolus? Unsure of accuracy, will plan to assess today. No additional recommendations. Will plan to see today.   Thanks, Lujean RaveLauren Francheska Villeda, MSN, RNC-OB Diabetes Coordinator [phone] (8a-5p)  Addendum @ 1530: Spoke with patient who verified that she had a low BS on 6/16. She states, "This is pretty unusual." When asked about using the pump to bolus following the low, patient denies and seemed confused. Assuming this was charted in error. Patient seems appropriate for insulin pump. No further concerns. LM

## 2018-03-23 NOTE — Consult Note (Signed)
WOC consult requested for left posterior thigh and left heel.  This was already performed yesterday; please refer to previous consult note for assessment and measurements, and plan of care has been provided for bedside nurses. Please re-consult if further assistance is needed.  Thank-you,  Cammie Mcgeeawn Syris Brookens MSN, RN, CWOCN, [place]CN-AP, CNS [phone]

## 2018-03-23 NOTE — Evaluation (Signed)
Physical Therapy Assessment and Plan  Patient Details  Name: Hannah Valentine MRN: [id number] Date of Birth: 29-Nov-1955  PT Diagnosis: Difficulty walking, Edema, Muscle weakness and Pain in joint Rehab Potential: Good ELOS: 7-9 days   Today's Date: 03/23/2018 PT Individual Time: 0800-0900 PT Individual Time Calculation (min): 60 min    Problem List:  Patient Active Problem List   Diagnosis Date Noted  . CMV infection, acute ([place]) 03/22/2018  . Tibia/fibula fracture 03/22/2018  . Pressure injury of skin 03/20/2018  . Fever   . Trauma   . FUO (fever of unknown origin)   . Hypertensive crisis   . Prediabetes   . Acute blood loss anemia   . Closed fracture of upper end of left fibula   . Left tibial fracture 03/11/2018  . Hypoglycemia 10/04/2017  . Fall 10/04/2017  . SAH (subarachnoid hemorrhage) ([place]) 10/04/2017  . DM (diabetes mellitus), type 1 ([place]) 10/04/2017  . Essential hypertension   . HLD (hyperlipidemia)   . CKD (chronic kidney disease), stage III ([place])   . Closed fracture of left orbital floor ([place])   . Closed fracture of maxillary sinus ([place])   . Syncope   . History of kidney transplant 10/07/2015    Past Medical History:  Past Medical History:  Diagnosis Date  . CKD (chronic kidney disease), stage III ([place])   . Complication of anesthesia   . Essential hypertension   . HLD (hyperlipidemia)   . Kidney transplanted 2017  . PONV (postoperative nausea and vomiting)   . Type II diabetes mellitus with renal manifestations [facility])    Past Surgical History:  Past Surgical History:  Procedure Laterality Date  . NEPHRECTOMY TRANSPLANTED ORGAN    . OPEN REDUCTION INTERNAL FIXATION (ORIF) TIBIA/FIBULA FRACTURE Left 03/12/2018   Procedure: OPEN REDUCTION INTERNAL FIXATION (ORIF) TIBIA/FIBULA FRACTURE;  Surgeon: Shona Needles, MD;  Location: [place];  Service: Orthopedics;  Laterality: Left;  . ORIF TIBIA FRACTURE Left 03/12/2018    Assessment & Plan Clinical Impression:  Patient is a 62 y.o. year old female with history of hypertension, diabetes mellitus with insulin pump, renal transplant 2017 with baseline serum creatinine 1.6-1.7 and has a left fistula. Per chart review patient lives with spouse. Independent prior to admission. Multilevel home with bedroom upstairs. Admitted 03/11/2018 with fall and subsequent left tibia-fibula fracture. There was no loss of consciousness. She was evaluated by Dr. Doreatha Martin with orthopedic services and underwent ORIF left proximal tibia 03/12/2018. Non-weightbearing with hinged knee brace. Hospital course pain management. Acute blood loss anemia and monitored. Patient developed significant fevers with sweats and mild arthralgia. Acute on chronic renal failure as well as poorly controlled blood sugars with signs of early DKA on 03/17/2018. Her Lasix was placed on hold she was treated with gentle hydration renal status continued to improve followed by nephrology services. Dr. De Burrs of infectious disease consulted for input in regards to fever work-up revealed urine positive CMV PCR which she was started on valganciclovir 03/16/2018 for CMV x21 days as well as Bactrim. Blood culture showed no growth, lower extremity Dopplers negative. Subcutaneous heparin for DVT prophylaxis. WOC follow-up 03/22/2018 for partial thickness nonpressure area left posterior thigh with order for foam dressings. Physical and occupational therapy evaluations completed with recommendations of physical medicine rehab consult. Patient was admitted for a comprehensive rehab program.  Patient transferred to CIR on 03/22/2018 .   Patient currently requires min with mobility secondary to muscle weakness and muscle joint tightness and decreased standing balance, decreased balance  strategies and difficulty maintaining precautions.  Prior to hospitalization, patient was independent  with mobility and lived with Spouse in a House home.  Home access is  Level entry.  Patient will benefit  from skilled PT intervention to maximize safe functional mobility, minimize fall risk and decrease caregiver burden for planned discharge intermittent supervision.  Anticipate patient will benefit from follow up [place] at discharge.  PT - End of Session Activity Tolerance: Tolerates 30+ min activity with multiple rests Endurance Deficit: Yes PT Assessment Rehab Potential (ACUTE/IP ONLY): Good PT Barriers to Discharge: Home environment access/layout PT Patient demonstrates impairments in the following area(s): Balance;Sensory;Edema;Endurance;Motor;Pain PT Transfers Functional Problem(s): Bed Mobility;Bed to Chair;Car;Furniture;Floor PT Locomotion Functional Problem(s): Ambulation;Wheelchair Mobility;Stairs PT Plan PT Intensity: Minimum of 1-2 x/day ,45 to 90 minutes PT Frequency: 5 out of 7 days PT Duration Estimated Length of Stay: 7-9 days PT Treatment/Interventions: Ambulation/gait training;DME/adaptive equipment instruction;Neuromuscular re-education;UE/LE Strength taining/ROM;Wheelchair propulsion/positioning;Balance/vestibular training;Discharge planning;Pain management;Skin care/wound management;Therapeutic Activities;UE/LE Coordination activities;Disease management/prevention;Functional mobility training;Patient/family education;Splinting/orthotics;Therapeutic Exercise PT Transfers Anticipated Outcome(s): supervision  PT Locomotion Anticipated Outcome(s): supervision PT Recommendation Follow Up Recommendations: Home health PT Patient destination: Home Equipment Recommended: To be determined  Skilled Therapeutic Intervention Evaluation completed (see details above and below) with education on PT POC and goals and individual treatment initiated with focus on functional mobility, LE precautions, L LE ROM. Pt supine in bed upon PT arrival, agreeable to therapy tx and reports pain 2-3/10 at rest, increased with activity. Pt educated on exercises to perform in bed and the importance of  ice/elevation in L LE edema management. Pt transferred from supine>sitting EOB with min assist for L LE management, use of bedrails and bed features. Pt performed sit<>stand with RW and min assist, pt reports dizziness that subsides after about 10 seconds. Pt performed stand pivot transfer from bed>w/c with min assist and RW, verbal cues for precautions safety, therapist lifting L LE when pt sits to ensure NWB. Pt propelled w/c throughout unit with supervision-min assist for turns, verbal cues for techniques. Pt performed stand pivot back to recliner at end of session with min assist and RW, verbal cues for precautions. Pt left seated in recliner with ice applied to L knee and L ankle for swelling. Needs in reach.    PT Evaluation Precautions/Restrictions Precautions Precautions: Fall Required Braces or Orthoses: Other Brace/Splint Other Brace/Splint: hinged brace LLE (locked at 0), ortho PA reports brace can be unlocked when up and locked at night for sleeping.   Restrictions Weight Bearing Restrictions: Yes LLE Weight Bearing: Non weight bearing General   Vital SignsTherapy Vitals Pulse Rate: 68 BP: (!) 175/60 Pain Pain Assessment Pain Scale: 0-10 Pain Score: 7  Pain Type: Acute pain;Surgical pain Pain Location: Leg Pain Orientation: Left Pain Intervention(s): Medication (See eMAR) Home Living/Prior Functioning Home Living Living Arrangements: Spouse/significant other Available Help at Discharge: Family;Available 24 hours/day Type of Home: House Home Access: Level entry Home Layout: Multi-level(split level home, can stay on the first floor where there is level entry through the garage) Alternate Level Stairs-Number of Steps: 12 Alternate Level Stairs-Rails: Right Additional Comments: does not have bedroom downstairs; would have to get hospital bed; uptairs in 5 steps to landing and then another 6  Lives With: Spouse Prior Function Level of Independence: Independent with basic  ADLs;Independent with gait  Able to Take Stairs?: Yes Driving: Yes Vocation: Retired Leisure: Hobbies-yes (Comment) Comments: takes care of rescue dog Cognition Overall Cognitive Status: Within Functional Limits for tasks assessed Arousal/Alertness: Awake/alert Orientation Level: Oriented  X4 Sensation Sensation Light Touch: Appears Intact Additional Comments: numbness near incisions on L LE, otherwise intact Coordination Gross Motor Movements are Fluid and Coordinated: No Fine Motor Movements are Fluid and Coordinated: No Coordination and Movement Description: LE coordination limited secondary to pain and L LE ROM deficits.  Motor  Motor Motor - Skilled Clinical Observations: generalized weakness, L LE limited secondary to pain and ROM  Mobility Bed Mobility Bed Mobility: Rolling Right;Rolling Left;Supine to Sit;Sit to Supine Rolling Right: Minimal Assistance - Patient > 75% Rolling Left: Minimal Assistance - Patient > 75% Supine to Sit: Minimal Assistance - Patient > 75% Sit to Supine: Minimal Assistance - Patient > 75% Transfers Transfers: Sit to Stand;Stand Pivot Transfers Sit to Stand: Minimal Assistance - Patient > 75% Stand Pivot Transfers: Minimal Assistance - Patient > 75% Stand Pivot Transfer Details: Verbal cues for safe use of DME/AE;Verbal cues for precautions/safety Transfer (Assistive device): Rolling walker Trunk/Postural Assessment  Cervical Assessment Cervical Assessment: Within Functional Limits Thoracic Assessment Thoracic Assessment: Within Functional Limits Lumbar Assessment Lumbar Assessment: Within Functional Limits Postural Control Postural Control: Within Functional Limits  Balance Balance Balance Assessed: Yes Static Sitting Balance Static Sitting - Level of Assistance: 5: Stand by assistance Dynamic Sitting Balance Dynamic Sitting - Level of Assistance: 5: Stand by assistance Static Standing Balance Static Standing - Level of Assistance:  4: Min assist Dynamic Standing Balance Dynamic Standing - Level of Assistance: 4: Min assist Extremity Assessment  RLE Assessment RLE Assessment: Within Functional Limits LLE Assessment LLE Assessment: Exceptions to [facility] Passive Range of Motion (PROM) Comments: knee flexion to 45 degrees, knee extension lacking 2 degrees, ankle DF lacking 15 degrees from neutral  Active Range of Motion (AROM) Comments: knee flexion to 30 degrees LLE Strength Left Hip Flexion: 2+/5 Left Hip Extension: 2+/5 Left Knee Flexion: 2-/5 Left Knee Extension: 2-/5 Left Ankle Dorsiflexion: 1/5 Left Ankle Plantar Flexion: 1/5   See Function Navigator for Current Functional Status.   Refer to Care Plan for Long Term Goals  Recommendations for other services: None   Discharge Criteria: Patient will be discharged from PT if patient refuses treatment 3 consecutive times without medical reason, if treatment goals not met, if there is a change in medical status, if patient makes no progress towards goals or if patient is discharged from hospital.  The above assessment, treatment plan, treatment alternatives and goals were discussed and mutually agreed upon: by patient  Netta Corrigan, PT, DPT 03/23/2018, 10:07 AM

## 2018-03-23 NOTE — Evaluation (Signed)
Occupational Therapy Assessment and Plan  Patient Details  Name: Hannah Valentine MRN: [id number] Date of Birth: 06/10/1956  OT Diagnosis: acute pain, muscle weakness (generalized) and pain in joint Rehab Potential: Rehab Potential (ACUTE ONLY): Good ELOS: 7-10 days   Today's Date: 03/23/2018 OT Individual Time: 1106-1206  OT Individual Time Calculation (min): 60 min   Problem List:  Patient Active Problem List   Diagnosis Date Noted  . CMV infection, acute ([facility]) 03/22/2018  . Tibia/fibula fracture 03/22/2018  . Pressure injury of skin 03/20/2018  . Fever   . Trauma   . FUO (fever of unknown origin)   . Hypertensive crisis   . Prediabetes   . Acute blood loss anemia   . Closed fracture of upper end of left fibula   . Left tibial fracture 03/11/2018  . Hypoglycemia 10/04/2017  . Fall 10/04/2017  . SAH (subarachnoid hemorrhage) ([facility]) 10/04/2017  . DM (diabetes mellitus), type 1 ([facility]) 10/04/2017  . Essential hypertension   . HLD (hyperlipidemia)   . CKD (chronic kidney disease), stage III ([facility])   . Closed fracture of left orbital floor ([facility])   . Closed fracture of maxillary sinus ([facility])   . Syncope   . History of kidney transplant 10/07/2015    Past Medical History:  Past Medical History:  Diagnosis Date  . CKD (chronic kidney disease), stage III ([facility])   . Complication of anesthesia   . Essential hypertension   . HLD (hyperlipidemia)   . Kidney transplanted 2017  . PONV (postoperative nausea and vomiting)   . Type II diabetes mellitus with renal manifestations ([facility])    Past Surgical History:  Past Surgical History:  Procedure Laterality Date  . NEPHRECTOMY TRANSPLANTED ORGAN    . OPEN REDUCTION INTERNAL FIXATION (ORIF) TIBIA/FIBULA FRACTURE Left 03/12/2018   Procedure: OPEN REDUCTION INTERNAL FIXATION (ORIF) TIBIA/FIBULA FRACTURE;  Surgeon: Haddix, Kevin P, MD;  Location: [facility];  Service: Orthopedics;  Laterality: Left;  . ORIF TIBIA FRACTURE Left 03/12/2018     Assessment & Plan Clinical Impression: Patient is a 62 y.o.  right-handed female with history of hypertension, diabetes mellitus with insulin pump, renal transplant 2017 with baseline serum creatinine 1.6-1.7 and has a left fistula. Per chart review patient lives with spouse. Independent prior to admission. Multilevel home with bedroom upstairs. Admitted 03/11/2018 with fall and subsequent left tibia-fibula fracture. There was no loss of consciousness. She was evaluated by Dr. Haddix with orthopedic services and underwent ORIF left proximal tibia 03/12/2018. Non-weightbearing with hinged knee brace. Hospital course pain management. Acute blood loss anemia and monitored. Patient developed significant fevers with sweats and mild arthralgia. Acute on chronic renal failure as well as poorly controlled blood sugars with signs of early DKA on 03/17/2018. Her Lasix was placed on hold she was treated with gentle hydration renal status continued to improve followed by nephrology services. Dr. Komar of infectious disease consulted for input in regards to fever work-up revealed urine positive CMV PCR which she was started on valganciclovir 03/16/2018 for CMV x21 days as well as Bactrim. Blood culture showed no growth, lower extremity Dopplers negative. Subcutaneous heparin for DVT prophylaxis. WOC follow-up 03/22/2018 for partial thickness nonpressure area left posterior thigh with order for foam dressings. Physical and occupational therapy evaluations completed with recommendations of physical medicine rehab consult. Patient was admitted for a comprehensive rehab program.    Patient transferred to CIR on 03/22/2018 .    Patient currently requires min with basic self-care skills secondary to muscle weakness, decreased   cardiorespiratoy endurance and decreased standing balance, decreased postural control and decreased balance strategies and pain.  Prior to hospitalization, patient could complete ADLs with independent  .  Patient will benefit from skilled intervention to decrease level of assist with basic self-care skills prior to discharge home with care partner.  Anticipate patient will require intermittent supervision and follow up home health.  OT - End of Session Activity Tolerance: Tolerates 30+ min activity with multiple rests Endurance Deficit: Yes Endurance Deficit Description: requires frequent rest breaks OT Assessment Rehab Potential (ACUTE ONLY): Good OT Barriers to Discharge: Home environment access/layout OT Barriers to Discharge Comments: pt lives in split level home OT Patient demonstrates impairments in the following area(s): Balance;Endurance;Motor;Pain;Safety OT Basic ADL's Functional Problem(s): Grooming;Bathing;Dressing;Toileting OT Advanced ADL's Functional Problem(s): Simple Meal Preparation OT Transfers Functional Problem(s): Toilet;Tub/Shower OT Additional Impairment(s): None OT Plan OT Intensity: Minimum of 1-2 x/day, 45 to 90 minutes OT Frequency: 5 out of 7 days OT Duration/Estimated Length of Stay: 7-10 days OT Treatment/Interventions: Balance/vestibular training;Discharge planning;DME/adaptive equipment instruction;Functional mobility training;Patient/family education;Pain management;Psychosocial support;Self Care/advanced ADL retraining;Splinting/orthotics;Therapeutic Activities;Therapeutic Exercise;Wheelchair propulsion/positioning OT Basic Self-Care Anticipated Outcome(s): Supervision/setup OT Toileting Anticipated Outcome(s): Supervision/setup OT Bathroom Transfers Anticipated Outcome(s): Supervision OT Recommendation Recommendations for Other Services: Therapeutic Recreation consult Therapeutic Recreation Interventions: Pet therapy Patient destination: Home Follow Up Recommendations: Home health OT;24 hour supervision/assistance Equipment Recommended: 3 in 1 bedside comode;Tub/shower seat   Skilled Therapeutic Intervention OT eval completed with discussion of  rehab process, OT purpose, POC, ELOS, and goals.  ADL assessment completed with bathing and dressing at seated level with lateral leans when washing buttocks.  Pt declined wearing underwear and only had a dressing gown, therefore did not fully assess LB dressing - pt reports her husband will bring pants tomorrow.  Stand pivot transfers recliner <> BSC with min assist with RW.  Returned to recliner at end of session and applied ice to Lt knee for pain management.  OT Evaluation Precautions/Restrictions  Precautions Precautions: Fall Required Braces or Orthoses: Other Brace/Splint Other Brace/Splint: hinged brace LLE (locked at 0), ortho PA reports brace can be unlocked when up and locked at night for sleeping.   Restrictions Weight Bearing Restrictions: Yes LLE Weight Bearing: Non weight bearing Pain Pain Assessment Pain Scale: 0-10 Pain Score: 7  Pain Type: Acute pain;Surgical pain Pain Location: Leg Pain Orientation: Left Pain Intervention(s): Medication (See eMAR) Home Living/Prior Functioning Home Living Family/patient expects to be discharged to:: Private residence Living Arrangements: Spouse/significant other Available Help at Discharge: Family, Available 24 hours/day Type of Home: House Home Access: Level entry Home Layout: Multi-level(split level home, can live on first floor where there is level entry from garage) Alternate Level Stairs-Number of Steps: 12 Alternate Level Stairs-Rails: Right Bathroom Shower/Tub: Walk-in shower Bathroom Toilet: Standard Bathroom Accessibility: (28" doorway.  Accessible via RW, most likely not w/c) Additional Comments: does not have bedroom downstairs; would have to get hospital bed; uptairs in 5 steps to landing and then another 6  Lives With: Spouse Prior Function Level of Independence: Independent with basic ADLs, Independent with gait, Independent with homemaking with ambulation  Able to Take Stairs?: Yes Driving: Yes Vocation:  Retired Leisure: Hobbies-yes (Comment) Comments: takes care of rescue dog ADL  See Function Navigator Vision Baseline Vision/History: Wears glasses Wears Glasses: Reading only Patient Visual Report: No change from baseline Vision Assessment?: No apparent visual deficits Perception  Perception: Within Functional Limits Praxis Praxis: Intact Cognition Overall Cognitive Status: Within Functional Limits for tasks assessed Arousal/Alertness: Awake/alert Orientation Level:   Person;Place;Situation Person: Oriented Place: Oriented Situation: Oriented Year: 2019 Month: June Day of Week: Correct Memory: Appears intact Immediate Memory Recall: Sock;Blue;Bed Memory Recall: Sock;Blue;Bed Memory Recall Sock: Without Cue Memory Recall Blue: Without Cue Memory Recall Bed: Without Cue Attention: Alternating Awareness: Appears intact Problem Solving: Appears intact Safety/Judgment: Appears intact Sensation Sensation Light Touch: Appears Intact Proprioception: Appears Intact Additional Comments: numbness near incisions on L LE, otherwise intact Coordination Gross Motor Movements are Fluid and Coordinated: No Fine Motor Movements are Fluid and Coordinated: Yes Coordination and Movement Description: LE coordination limited secondary to pain and L LE ROM deficits.  Motor  Motor Motor - Skilled Clinical Observations: generalized weakness, L LE limited secondary to pain and ROM Mobility  Bed Mobility Bed Mobility: Rolling Right;Rolling Left;Supine to Sit;Sit to Supine Rolling Right: Minimal Assistance - Patient > 75% Rolling Left: Minimal Assistance - Patient > 75% Supine to Sit: Minimal Assistance - Patient > 75% Sit to Supine: Minimal Assistance - Patient > 75% Transfers Sit to Stand: Minimal Assistance - Patient > 75%  Trunk/Postural Assessment  Cervical Assessment Cervical Assessment: Within Functional Limits Thoracic Assessment Thoracic Assessment: Within Functional  Limits Lumbar Assessment Lumbar Assessment: Within Functional Limits Postural Control Postural Control: Within Functional Limits  Balance Balance Balance Assessed: Yes Static Sitting Balance Static Sitting - Level of Assistance: 5: Stand by assistance Dynamic Sitting Balance Dynamic Sitting - Level of Assistance: 5: Stand by assistance Static Standing Balance Static Standing - Level of Assistance: 4: Min assist Dynamic Standing Balance Dynamic Standing - Level of Assistance: 4: Min assist Extremity/Trunk Assessment RUE Assessment RUE Assessment: Within Functional Limits General Strength Comments: 5/5 LUE Assessment LUE Assessment: Within Functional Limits General Strength Comments: 5/5   See Function Navigator for Current Functional Status.   Refer to Care Plan for Long Term Goals  Recommendations for other services: Therapeutic Recreation  Pet therapy   Discharge Criteria: Patient will be discharged from OT if patient refuses treatment 3 consecutive times without medical reason, if treatment goals not met, if there is a change in medical status, if patient makes no progress towards goals or if patient is discharged from hospital.  The above assessment, treatment plan, treatment alternatives and goals were discussed and mutually agreed upon: by patient  Simonne Come 03/23/2018, 12:24 PM

## 2018-03-23 NOTE — Care Management Note (Signed)
[facility] Individual Statement of Services  Patient Name:  Hannah Valentine  Date:  03/23/2018  Welcome to the [facility].  Our goal is to provide you with an individualized program based on your diagnosis and situation, designed to meet your specific needs.  With this comprehensive rehabilitation program, you will be expected to participate in at least 3 hours of rehabilitation therapies Monday-Friday, with modified therapy programming on the weekends.  Your rehabilitation program will include the following services:  Physical Therapy (PT), Occupational Therapy (OT), 24 hour per day rehabilitation nursing, Case Management (Social Worker), Rehabilitation Medicine, Nutrition Services and Pharmacy Services  Weekly team conferences will be held on Wednesday to discuss your progress.  Your Social Worker will talk with you frequently to get your input and to update you on team discussions.  Team conferences with you and your family in attendance may also be held.  Expected length of stay: 7-10 days  Overall anticipated outcome: supervision with cueing  Depending on your progress and recovery, your program may change. Your Social Worker will coordinate services and will keep you informed of any changes. Your Social Worker's name and contact numbers are listed  below.  The following services may also be recommended but are not provided by the [facility]:   Driving Evaluations  Home Health Rehabiltiation Services  Outpatient Rehabilitation Services    Arrangements will be made to provide these services after discharge if needed.  Arrangements include referral to agencies that provide these services.  Your insurance has been verified to be:  Medicare & BCBS Your primary doctor is:  Daphane ShepherdLori Beane  Pertinent information will be shared with your doctor and your insurance company.  Social Worker:  Dossie DerBecky Tashai Catino, SW [phone] or ([phone])  [phone]  Information discussed with and copy given to patient by: Lucy Chrisupree, Navie Lamoreaux G, 03/23/2018, 9:36 AM

## 2018-03-24 ENCOUNTER — Inpatient Hospital Stay (HOSPITAL_COMMUNITY): Payer: Medicare Other | Admitting: Physical Therapy

## 2018-03-24 ENCOUNTER — Inpatient Hospital Stay (HOSPITAL_COMMUNITY): Payer: Medicare Other | Admitting: Occupational Therapy

## 2018-03-24 DIAGNOSIS — K5903 Drug induced constipation: Secondary | ICD-10-CM

## 2018-03-24 DIAGNOSIS — E1169 Type 2 diabetes mellitus with other specified complication: Secondary | ICD-10-CM

## 2018-03-24 DIAGNOSIS — E669 Obesity, unspecified: Secondary | ICD-10-CM

## 2018-03-24 DIAGNOSIS — T8149XA Infection following a procedure, other surgical site, initial encounter: Secondary | ICD-10-CM

## 2018-03-24 DIAGNOSIS — T402X5A Adverse effect of other opioids, initial encounter: Secondary | ICD-10-CM

## 2018-03-24 LAB — RENAL FUNCTION PANEL
Albumin: 2.5 g/dL — ABNORMAL LOW (ref 3.5–5.0)
Anion gap: 12 (ref 5–15)
BUN: 32 mg/dL — ABNORMAL HIGH (ref 6–20)
CO2: 18 mmol/L — AB (ref 22–32)
CREATININE: 1.59 mg/dL — AB (ref 0.44–1.00)
Calcium: 8.4 mg/dL — ABNORMAL LOW (ref 8.9–10.3)
Chloride: 101 mmol/L (ref 101–111)
GFR, EST AFRICAN AMERICAN: 39 mL/min — AB (ref >60)
GFR, EST NON AFRICAN AMERICAN: 34 mL/min — AB (ref >60)
Glucose, Bld: 411 mg/dL — ABNORMAL HIGH (ref 65–99)
POTASSIUM: 4.5 mmol/L (ref 3.5–5.1)
Phosphorus: 3.9 mg/dL (ref 2.5–4.6)
Sodium: 131 mmol/L — ABNORMAL LOW (ref 135–145)

## 2018-03-24 LAB — BK QUANT PCR (PLASMA/SERUM)
BK QUANTITATION PCR: 1130 {copies}/mL
LOG10 BK QN PCR: 3.053 {Log_copies}/mL

## 2018-03-24 LAB — GLUCOSE, CAPILLARY
GLUCOSE-CAPILLARY: 399 mg/dL — AB (ref 65–99)
GLUCOSE-CAPILLARY: 134 mg/dL — AB (ref 65–99)
Glucose-Capillary: 199 mg/dL — ABNORMAL HIGH (ref 65–99)
Glucose-Capillary: 108 mg/dL — ABNORMAL HIGH (ref 65–99)

## 2018-03-24 MED ORDER — POLYETHYLENE GLYCOL 3350 17 G PO PACK
17.0000 g | PACK | Freq: Every day | ORAL | Status: DC
  Administered 2018-03-25 – 2018-03-26 (×2): 17 g via ORAL
  Filled 2018-03-24 (×6): qty 1

## 2018-03-24 MED ORDER — CEPHALEXIN 250 MG PO CAPS
250.0000 mg | ORAL_CAPSULE | Freq: Four times a day (QID) | ORAL | Status: DC
  Administered 2018-03-24 – 2018-03-29 (×20): 250 mg via ORAL
  Filled 2018-03-24 (×20): qty 1

## 2018-03-24 MED ORDER — CEPHALEXIN 250 MG PO CAPS: 250.0000 mg | ORAL_CAPSULE | Freq: Two times a day (BID) | ORAL | Status: DC

## 2018-03-24 NOTE — Progress Notes (Signed)
Social Work   Jahvier Aldea, Elveria Rising  Social Worker  Physical Medicine and Rehabilitation  Patient Care Conference  Signed  Date of Service:  03/24/2018  1:44 PM          Signed          Show:Clear all [x] Manual[x] Template[] Copied  Added by: [x] Lucy Chris, LCSW   [] Hover for details   Inpatient RehabilitationTeam Conference and Plan of Care Update Date: 03/24/2018   Time: 11:30 AM      Patient Name: Hannah Valentine      Medical Record Number: [id number]  Date of Birth: [birth year] Sex: Female         Room/Bed: 4M03C/4M03C-01 Payor Info: Payor: MEDICARE / Plan: MEDICARE PART A AND B / Product Type: *No Product type* /     Admitting Diagnosis: Tib Fib FX Rebtak Transplant  Admit Date/Time:  03/22/2018  5:27 PM Admission Comments: No comment available    Primary Diagnosis:  <principal problem not specified> Principal Problem: <principal problem not specified>       Patient Active Problem List    Diagnosis Date Noted  . CMV infection, acute (HCC) 03/22/2018  . Tibia/fibula fracture 03/22/2018  . Pressure injury of skin 03/20/2018  . Fever    . Trauma    . FUO (fever of unknown origin)    . Hypertensive crisis    . Prediabetes    . Acute blood loss anemia    . Closed fracture of upper end of left fibula    . Left tibial fracture 03/11/2018  . Hypoglycemia 10/04/2017  . Fall 10/04/2017  . SAH (subarachnoid hemorrhage) (HCC) 10/04/2017  . DM (diabetes mellitus), type 1 (HCC) 10/04/2017  . Essential hypertension    . HLD (hyperlipidemia)    . CKD (chronic kidney disease), stage III (HCC)    . Closed fracture of left orbital floor (HCC)    . Closed fracture of maxillary sinus (HCC)    . Syncope    . History of kidney transplant 10/07/2015      Expected Discharge Date: Expected Discharge Date: 04/01/18   Team Members Present: Physician leading conference: Dr. Claudette Laws Social Worker Present: Dossie Der, LCSW Nurse Present: Kennon Portela,  RN PT Present: Harless Litten, PTA;Grier Rocher, PT OT Present: Rosalio Loud, OT PPS Coordinator present : Tora Duck, RN, CRRN       Current Status/Progress Goal Weekly Team Focus  Medical     ;left tib fib fx, CMV infection, pain, constipation, ?cellulitis LLE, ESRD  improve functional mobility  wound, pain, bowels   Bowel/Bladder     Continent of bladder  Timed toileting every 2 hours while awake. Free from skin breakdown  Educate patient to ask for assistance as needed   Swallow/Nutrition/ Hydration               ADL's     min assist bathing and UB dressing, min assist stand pivot transfers. Pt not wearing pants therefore still TBD LB dressing and toileting  Supervision overall  dynamic standing balance, LB bathing/dressing, toileting tasks   Mobility     Mini assist with bed mobility. One assist with transfers.  Free from injury  and falls  Gait activities   Communication               Safety/Cognition/ Behavioral Observations             Pain               Skin  No skin breakdown  Free from skin breakdown  Turn and reposition every 2 hours and as needed. Skin assessment every shift with documentation     Rehab Goals Patient on target to meet rehab goals: Yes *See Care Plan and progress notes for long and short-term goals.      Barriers to Discharge   Current Status/Progress Possible Resolutions Date Resolved   Physician     Medical stability;Wound Care        see progress note      Nursing                 PT  Home environment access/layout                 OT Home environment access/layout  pt lives in split level home             SLP            SW Decreased caregiver support husband has a back issue             Discharge Planning/Teaching Needs:  Home with husband who can provide some assist but has back issues so is limited.      Team Discussion:  Goals supervision level overall. having constipation issues and was up all night with her bowel  issues. Pt manages her insulin pump and feels it is stable-pt tends to do what she wants with this. Pain issues MD is addressing management with med adjustments. At times having difficulty with WB issues. Husband here today to observe in therapies.   Revisions to Treatment Plan:  DC 6/27    Continued Need for Acute Rehabilitation Level of Care: The patient requires daily medical management by a physician with specialized training in physical medicine and rehabilitation for the following conditions: Daily direction of a multidisciplinary physical rehabilitation program to ensure safe treatment while eliciting the highest outcome that is of practical value to the patient.: Yes Daily medical management of patient stability for increased activity during participation in an intensive rehabilitation regime.: Yes Daily analysis of laboratory values and/or radiology reports with any subsequent need for medication adjustment of medical intervention for : Wound care problems;Renal problems   Lucy ChrisDupree, Christoph Copelan G 03/24/2018, 1:44 PM                 Patient ID: Lorane GellDonna Macha, female   DOB: 10/14/1955, 62 y.o.   MRN: [id number]

## 2018-03-24 NOTE — Progress Notes (Signed)
Physical Therapy Session Note  Patient Details  Name: Hannah Valentine MRN: [id number] Date of Birth: [birth year]  Today's Date: 03/24/2018 PT Individual Time:1045-1130 AND 1500-1600   45 min and 60 min   Short Term Goals: Week 1:  PT Short Term Goal 1 (Week 1): STG=LTG  Skilled Therapeutic Interventions/Progress Updates:   Pt received supine in bed and agreeable to PT. Pt reports that she is having a hard time managing her bowels in this. PT offered to assist pt to don brief in case of loose stool. Rolling R and L with min -mod assist from PT due to pain in the distal LLE to don brief.  Supine>sit transfer with min assist to prevent WB through the LLE.   Sit<>stand at EOB with mod assist and moderate cues for proper UE placement and prevention of WB through LLE. Stand pivot transfers completed with mod assist to come to standing due to difficulty with maintenance of NWB.   WC mobility instructed by PT with supervision assist from PT with set up of WC parts by PT and min cues for improved turning technique and doorway management.   Seated LLE therex: AAROM knee flexion/extension x 10. AAROM ankle into DF x 10 with 10 sec hold at end range.   Trade off with additional PT for next treatment session with pt in [place].    Session 2.  Pt received supine in bed and agreeable to PT. Supine>sit transfer with min  Assist to faciliate NWB through the LLE. Stand pivot transfer to [facility] with min assist and moderate  Cues for proper set up to improve safety and prevent WB through the LLE. Pt noted to have improved technique compared to AM PT treatment.   WC mobility instructed by PT with supervision assist from PT 2 x 119f with min cues for equal use of BUE to maintain straight path and safety in turns. Pt's WC noted to have unstable R arm rest. PT obtained stabilizing wing nut to secure arm rest at pivot point to improve safety of WC mobility and trasnfers.   Gait training initiated in parallel bars x 2 ft  forward, 2 ft backward with mod assist from PT to facilitate weight shift over sound LE and proper use of BUE to improve safety with Limb advancement. Pt also instructed in gait training with RW x 442fand mod assist for AD management and safety to maintain NWB.   Patient returned to room and [facility] in [facility] in reach and all needs met.         Therapy Documentation Precautions:  Precautions Precautions: Fall Required Braces or Orthoses: Other Brace/Splint Other Brace/Splint: hinged brace LLE (locked at 0), ortho PA reports brace can be unlocked when up and locked at night for sleeping.   Restrictions Weight Bearing Restrictions: Yes LLE Weight Bearing: Non weight bearing Vital Signs: Therapy Vitals Temp: 98.2 F (36.8 C) Temp Source: Oral Pulse Rate: 87 Resp: 18 BP: (!) 162/55 Patient Position (if appropriate): Lying Oxygen Therapy SpO2: 99 % O2 Device: Room Air Pain:   4/10 L LLE. Aching.   See Function Navigator for Current Functional Status.   Therapy/Group: Individual Therapy  AuLorie Phenix/19/2019, 8:08 AM

## 2018-03-24 NOTE — Progress Notes (Signed)
[facility] PHYSICAL MEDICINE & REHABILITATION     PROGRESS NOTE    Subjective/Complaints: Had a rough night. Constipation. Some results with intervention, enema. Loose stool around hard stool this am. Pt is tired  ROS: Patient denies fever, rash, sore throat, blurred vision, nausea, vomiting,  cough, shortness of breath or chest pain,   headache, or mood change.   Objective:  No results found. Recent Labs    03/22/18 0301 03/23/18 0612  WBC 5.6 5.4  HGB 9.8* 10.0*  HCT 30.6* 31.4*  PLT 242 261   Recent Labs    03/23/18 0612 03/24/18 0719  NA 137 131*  K 3.6 4.5  CL 106 101  GLUCOSE 135* 411*  BUN 26* 32*  CREATININE 1.44* 1.59*  CALCIUM 8.4* 8.4*   CBG (last 3)  Recent Labs    03/23/18 1712 03/23/18 2130 03/24/18 0632  GLUCAP 142* 58* 399*    Wt Readings from Last 3 Encounters:  03/24/18 93.2 kg (205 lb 7.5 oz)  03/22/18 95 kg (209 lb 7 oz)  10/04/17 93.4 kg (205 lb 14.6 oz)     Intake/Output Summary (Last 24 hours) at 03/24/2018 0855 Last data filed at 03/23/2018 1700 Gross per 24 hour  Intake 480 ml  Output -  Net 480 ml    Vital Signs: Blood pressure (!) 162/55, pulse 87, temperature 98.2 F (36.8 C), temperature source Oral, resp. rate 18, height 5\' 7"  (1.702 m), weight 93.2 kg (205 lb 7.5 oz), SpO2 99 %. Physical Exam:  Constitutional: fatigued. Vital signs reviewed. HEENT: EOMI, oral membranes moist Neck: supple Cardiovascular: RRR without murmur. No JVD    Respiratory: CTA Bilaterally without wheezes or rales. Normal effort    GI: BS +, non-tender, non-distended  Musculoskeletal: She exhibits edema.  Tender left lower leg  Neurological: She is alert and oriented to person, place, and time.  UE 5/5. RLE: 4/5. LLE: limited by pain 1-2/5, wiggles toes. . No sensory findings  Skin:  Scattered wounds, incisions LLE. Left leg warm/erythematous, remains tender Psychiatric: She has a normal mood and affect. Her behavior is normal.      Assessment/Plan: 1. Functional deficits secondary to left tib-fib fx which require 3+ hours per day of interdisciplinary therapy in a comprehensive inpatient rehab setting. Physiatrist is providing close team supervision and 24 hour management of active medical problems listed below. Physiatrist and rehab team continue to assess barriers to discharge/monitor patient progress toward functional and medical goals.  Function:  Bathing Bathing position   Position: (seated in recliner)  Bathing parts Body parts bathed by patient: Right arm, Left arm, Chest, Abdomen, Front perineal area, Buttocks, Right upper leg, Back    Bathing assist Assist Level: Touching or steadying assistance(Pt > 75%)      Upper Body Dressing/Undressing Upper body dressing   What is the patient wearing?: Button up shirt         Button up shirt - Perfomed by patient: Thread/unthread right sleeve, Thread/unthread left sleeve, Pull shirt around back, Button/unbutton shirt      Upper body assist Assist Level: Set up   Set up : To obtain clothing/put away  Lower Body Dressing/Undressing Lower body dressing   What is the patient wearing?: Non-skid slipper socks         Non-skid slipper socks- Performed by patient: Don/doff right sock Non-skid slipper socks- Performed by helper: Don/doff left sock                  Lower  body assist        Toileting Toileting          Toileting assist     Transfers Chair/bed transfer   Chair/bed transfer method: Stand pivot Chair/bed transfer assist level: Touching or steadying assistance (Pt > 75%) Chair/bed transfer assistive device: Armrests, Patent attorney Ambulation activity did not occur: Safety/medical concerns         Wheelchair   Type: Manual Max wheelchair distance: 150 ft Assist Level: Touching or steadying assistance (Pt > 75%)  Cognition Comprehension Comprehension assist level: Follows complex  conversation/direction with extra time/assistive device  Expression Expression assist level: Expresses complex ideas: With extra time/assistive device  Social Interaction Social Interaction assist level: Interacts appropriately with others - No medications needed.  Problem Solving Problem solving assist level: Solves complex problems: With extra time  Memory Memory assist level: Recognizes or recalls 90% of the time/requires cueing < 10% of the time   Medical Problem List and Plan:  1. Decreased functional mobility secondary to left tibia-fibula fracture after fall status post ORIF 03/12/2018. Non-weightbearing with hinged knee brace.  -continue therapies 2. DVT Prophylaxis/Anticoagulation: Subcutaneous heparin. Venous Doppler studies negative  3. Pain Management: Oxycodone as needed  4. Mood: Provide emotional support  5. Neuropsych: This patient is capable of making decisions on her own behalf.  6. Skin/Wound Care: Routine skin checks    -concerns about cellulitis LLE   -begin empiric keflex 7. Fluids/Electrolytes/Nutrition: Routine in and outs with follow-up chemistries  8. Acute on chronic anemia. Follow-up CBC. Continue iron supplement  9.Marland KitchenID/CMV infection. Continue Valganciclovir per infectious disease as well as Bactrim x21 days. Follow-up transplant nephrologist as outpatient  10. Diabetes mellitus. Patient with insulin pump. Diabetic coordinator follow-up. Hemoglobin A1c 5.8.  -continue to monitor sugars 11. CKD stage III with history of kidney transplant 2017. Follow-up chemistries. Continue Prograf and chronic prednisone  -Cr trending down 1.44    12. Hypertension. Norvasc 10 mg daily, Cardura 4 mg daily, hydralazine 100 mg every 8 hours, imdur 60 mg twice daily, Lopressor 50 mg twice daily.  -fair control at present  13. Hyperlipidemia. Pravachol  14. Constipation. Persistent constipation despite suppository, oral laxatives  -SSE today if no bm this morning      LOS (Days)  2 A FACE TO FACE EVALUATION WAS PERFORMED  Ranelle Oyster, MD 03/24/2018 8:55 AM

## 2018-03-24 NOTE — Patient Care Conference (Signed)
Inpatient RehabilitationTeam Conference and Plan of Care Update Date: 03/24/2018   Time: 11:30 AM    Patient Name: Hannah Valentine      Medical Record Number: [id number]  Date of Birth: 12/27/1955 Sex: Female         Room/Bed: 4M03C/4M03C-01 Payor Info: Payor: MEDICARE / Plan: MEDICARE PART A AND B / Product Type: *No Product type* /    Admitting Diagnosis: Tib Fib FX Rebtak Transplant  Admit Date/Time:  03/22/2018  5:27 PM Admission Comments: No comment available   Primary Diagnosis:  <principal problem not specified> Principal Problem: <principal problem not specified>  Patient Active Problem List   Diagnosis Date Noted  . CMV infection, acute (HCC) 03/22/2018  . Tibia/fibula fracture 03/22/2018  . Pressure injury of skin 03/20/2018  . Fever   . Trauma   . FUO (fever of unknown origin)   . Hypertensive crisis   . Prediabetes   . Acute blood loss anemia   . Closed fracture of upper end of left fibula   . Left tibial fracture 03/11/2018  . Hypoglycemia 10/04/2017  . Fall 10/04/2017  . SAH (subarachnoid hemorrhage) (HCC) 10/04/2017  . DM (diabetes mellitus), type 1 (HCC) 10/04/2017  . Essential hypertension   . HLD (hyperlipidemia)   . CKD (chronic kidney disease), stage III (HCC)   . Closed fracture of left orbital floor (HCC)   . Closed fracture of maxillary sinus (HCC)   . Syncope   . History of kidney transplant 10/07/2015    Expected Discharge Date: Expected Discharge Date: 04/01/18  Team Members Present: Physician leading conference: Dr. Claudette LawsAndrew Kirsteins Social Worker Present: Dossie DerBecky Alessander Sikorski, LCSW Nurse Present: Kennon PortelaJeanna Hicks, RN PT Present: Harless Littenosita Dechalus, PTA;Grier RocherAustin Tucker, PT OT Present: Rosalio LoudSarah Hoxie, OT PPS Coordinator present : Tora DuckMarie Noel, RN, CRRN     Current Status/Progress Goal Weekly Team Focus  Medical   ;left tib fib fx, CMV infection, pain, constipation, ?cellulitis LLE, ESRD  improve functional mobility  wound, pain, bowels   Bowel/Bladder   Continent of bladder  Timed toileting every 2 hours while awake. Free from skin breakdown  Educate patient to ask for assistance as needed   Swallow/Nutrition/ Hydration             ADL's   min assist bathing and UB dressing, min assist stand pivot transfers. Pt not wearing pants therefore still TBD LB dressing and toileting  Supervision overall  dynamic standing balance, LB bathing/dressing, toileting tasks   Mobility   Mini assist with bed mobility. One assist with transfers.  Free from injury  and falls  Gait activities   Communication             Safety/Cognition/ Behavioral Observations            Pain             Skin   No skin breakdown  Free from skin breakdown  Turn and reposition every 2 hours and as needed. Skin assessment every shift with documentation    Rehab Goals Patient on target to meet rehab goals: Yes *See Care Plan and progress notes for long and short-term goals.     Barriers to Discharge  Current Status/Progress Possible Resolutions Date Resolved   Physician    Medical stability;Wound Care        see progress note      Nursing                  PT  Home environment access/layout  OT Home environment access/layout  pt lives in split level home             SLP                SW Decreased caregiver support husband has a back issue            Discharge Planning/Teaching Needs:  Home with husband who can provide some assist but has back issues so is limited.      Team Discussion:  Goals supervision level overall. having constipation issues and was up all night with her bowel issues. Pt manages her insulin pump and feels it is stable-pt tends to do what she wants with this. Pain issues MD is addressing management with med adjustments. At times having difficulty with WB issues. Husband here today to observe in therapies.   Revisions to Treatment Plan:  DC 6/27    Continued Need for Acute Rehabilitation Level of Care: The patient  requires daily medical management by a physician with specialized training in physical medicine and rehabilitation for the following conditions: Daily direction of a multidisciplinary physical rehabilitation program to ensure safe treatment while eliciting the highest outcome that is of practical value to the patient.: Yes Daily medical management of patient stability for increased activity during participation in an intensive rehabilitation regime.: Yes Daily analysis of laboratory values and/or radiology reports with any subsequent need for medication adjustment of medical intervention for : Wound care problems;Renal problems  Lucy Chris 03/24/2018, 1:44 PM

## 2018-03-24 NOTE — Progress Notes (Signed)
PHARMACY NOTE:  ANTIMICROBIAL RENAL DOSAGE ADJUSTMENT  Current antimicrobial regimen includes a mismatch between antimicrobial dosage and estimated renal function.  As per policy approved by the Pharmacy & Therapeutics and Medical Executive Committees, the antimicrobial dosage will be adjusted accordingly.  Current antimicrobial dosage:  Keflex 250 mg bid  Indication: Cellulitis  Renal Function:  Estimated Creatinine Clearance: 43 mL/min (A) (by C-G formula based on SCr of 1.59 mg/dL (H)). []      On intermittent HD, scheduled: []      On CRRT    Antimicrobial dosage has been changed to:  Keflex 250 mg 4x/day  Additional comments:   Thank you for allowing pharmacy to be a part of this patient's care.  Rolley SimsMartin, Rosabelle Jupin Ann, [facility] LLCRPH 03/24/2018 9:05 AM

## 2018-03-24 NOTE — Progress Notes (Signed)
Physical Therapy Session Note  Patient Details  Name: Hannah Valentine MRN: [id number] Date of Birth: 08/21/1956  Today's Date: 03/24/2018 PT Individual Time: 1130-1200 PT Individual Time Calculation (min): 30 min   Short Term Goals: Week 1:  PT Short Term Goal 1 (Week 1): STG=LTG  Skilled Therapeutic Interventions/Progress Updates:    Pt received seated in w/c in therapy gym from previous PT session. No complaints of pain at rest but does report feeling constipated. Pt has onset of LLE pain with therex, not rated. Seated LLE LAQ x 10 reps with active-assist, increased time needed to complete secondary to pain. Seated LLE heel slides x 5 reps with pillow case and active-assist before pt reports pain is too great to continue. Pt reports having incontinence of bowel while seated in w/c. Stand pivot transfer w/c to commode with RW and min A, v/c to adhere to NWB LLE. Standing balance with RW and min A while brief removed. Pt has not had incontinence. Pt left seated on bedside commode with call button in reach, RN aware.  Therapy Documentation Precautions:  Precautions Precautions: Fall Required Braces or Orthoses: Other Brace/Splint Other Brace/Splint: hinged brace LLE (locked at 0), ortho PA reports brace can be unlocked when up and locked at night for sleeping.   Restrictions Weight Bearing Restrictions: Yes LLE Weight Bearing: Non weight bearing  See Function Navigator for Current Functional Status.   Therapy/Group: Individual Therapy  Peter Congoaylor Ranell Skibinski, PT, DPT  03/24/2018, 12:19 PM

## 2018-03-24 NOTE — Progress Notes (Signed)
Social Work Patient ID: Hannah Valentine, female   DOB: 1956/07/04, 62 y.o.   MRN: [id number]  Met with to and husband who was in the room to inform team conference goals-supervision level and target discharge date 6/27. Pt is feeling better since she is not constipated anymore. She will see if she will be ready next week. Husband can provide supervision but not much more due to his own health issues. Work on discharge needs.

## 2018-03-24 NOTE — Progress Notes (Signed)
Patient has had several small bowl movements early this morning and this afternoon she had a large B/M.  She stated she is feeling much better.

## 2018-03-24 NOTE — Progress Notes (Signed)
Occupational Therapy Session Note  Patient Details  Name: Hannah Valentine MRN: [id number] Date of Birth: 01/09/1956  Today's Date: 03/24/2018 OT Individual Time: [id number] and 1430-1500 OT Individual Time Calculation (min): 41 min and 30 min   Short Term Goals: Week 1:  OT Short Term Goal 1 (Week 1): STG = LTGs due to ELOS  Skilled Therapeutic Interventions/Progress Updates:    1) Limited participation due to pt reporting having a restless night due to constipation and failed interventions to ease constipation.  Pt refusing OOB activity due to fatigue and fear of urgent need to toilet.  Engaged in PROM and AAROM to Lt ankle with cues to recall exercises from session with PTA.  Tactile cues to ensure proper stretch with dorsiflexion.  PROM for inversion and eversion as well as light retrograde massage to foot and ankle with pt grimacing to touch.  Pt refusing pain meds due to constipation and to pain meds making her excessively lethargic and sleepy yesterday.  Encouraged OOB activity as tolerated with pt reporting understanding but not feeling up to it at this time.  2) Treatment session with focus on discharge planning and problem solving home setup.  Pt reporting decreased access to bathroom due to setup, encouraged pt to have husband take pictures to provide therapist to allow for problem solving access to bathroom.  Discussed use of BSC next to bed if bathroom truly not accessible via w/c.  Discussed modifying setup of large room that will be pt's primary living and sleeping space to increase access via w/c and provide alternative seating options.  Pt continues to demonstrate decreased awareness and problem solving as she often will defer things to "when I am able to go upstairs".  Therapy Documentation Precautions:  Precautions Precautions: Fall Required Braces or Orthoses: Other Brace/Splint Other Brace/Splint: hinged brace LLE (locked at 0), ortho PA reports brace can be unlocked when up and  locked at night for sleeping.   Restrictions Weight Bearing Restrictions: Yes LLE Weight Bearing: Non weight bearing Pain:  Pt reports pain in Lt foot/ankle with ROM and massage, not rated.  Pt refusing pain meds.  See Function Navigator for Current Functional Status.   Therapy/Group: Individual Therapy  Rosalio LoudHOXIE, Doriann Zuch 03/24/2018, 9:44 AM

## 2018-03-25 ENCOUNTER — Inpatient Hospital Stay (HOSPITAL_COMMUNITY): Payer: Medicare Other

## 2018-03-25 ENCOUNTER — Inpatient Hospital Stay (HOSPITAL_COMMUNITY): Payer: Medicare Other | Admitting: *Deleted

## 2018-03-25 ENCOUNTER — Inpatient Hospital Stay (HOSPITAL_COMMUNITY): Payer: Medicare Other | Admitting: Physical Therapy

## 2018-03-25 ENCOUNTER — Inpatient Hospital Stay (HOSPITAL_COMMUNITY): Payer: Medicare Other | Admitting: Occupational Therapy

## 2018-03-25 DIAGNOSIS — N183 Chronic kidney disease, stage 3 (moderate): Secondary | ICD-10-CM

## 2018-03-25 LAB — GLUCOSE, CAPILLARY
GLUCOSE-CAPILLARY: 146 mg/dL — AB (ref 65–99)
GLUCOSE-CAPILLARY: 172 mg/dL — AB (ref 65–99)
GLUCOSE-CAPILLARY: 112 mg/dL — AB (ref 65–99)

## 2018-03-25 LAB — RENAL FUNCTION PANEL
ALBUMIN: 2.6 g/dL — AB (ref 3.5–5.0)
ANION GAP: 9 (ref 5–15)
BUN: 22 mg/dL — AB (ref 6–20)
CHLORIDE: 107 mmol/L (ref 101–111)
CO2: 24 mmol/L (ref 22–32)
Calcium: 8.5 mg/dL — ABNORMAL LOW (ref 8.9–10.3)
Creatinine, Ser: 1.47 mg/dL — ABNORMAL HIGH (ref 0.44–1.00)
GFR calc non Af Amer: 37 mL/min — ABNORMAL LOW (ref >60)
GFR, EST AFRICAN AMERICAN: 43 mL/min — AB (ref >60)
Glucose, Bld: 126 mg/dL — ABNORMAL HIGH (ref 65–99)
POTASSIUM: 3.6 mmol/L (ref 3.5–5.1)
Phosphorus: 3.5 mg/dL (ref 2.5–4.6)
Sodium: 140 mmol/L (ref 135–145)

## 2018-03-25 MED ORDER — FUROSEMIDE 20 MG PO TABS
20.0000 mg | ORAL_TABLET | Freq: Every day | ORAL | Status: DC
  Administered 2018-03-25 – 2018-04-01 (×8): 20 mg via ORAL
  Filled 2018-03-25 (×9): qty 1

## 2018-03-25 NOTE — Progress Notes (Signed)
Physical Therapy Session Note  Patient Details  Name: Hannah Valentine MRN: [id number] Date of Birth: 06/08/1956  Today's Date: 03/25/2018 PT Individual Time: 0800-0900 PT Individual Time Calculation (min): 60 min   Short Term Goals: Week 1:  PT Short Term Goal 1 (Week 1): STG=LTG  Skilled Therapeutic Interventions/Progress Updates:    Pt received semi-reclined in bed, agreeable to PT. No complaints of pain at rest, has increase in LLE pain with movement, not rated. ACE wrap applied to LLE for swelling and protection of incision from knee brace. Dons L knee brace dependently. Bed mobility min A for LLE management. Sit to stand to RW with min A and elevated bed height. Stand pivot transfer bed to w/c with min A and RW. Manual w/c propulsion 2 x 150 ft with BUE with SBA. Sit to stand x 3 reps to RW with CGA to min A. Standing tolerance x 1 min. Seated BUE therex per pt request with 5# dowel rod: chest press, bicep curbs, tricep ext x 15 reps each. Pt left seated in w/c in room with needs in reach.  Therapy Documentation Precautions:  Precautions Precautions: Fall Required Braces or Orthoses: Other Brace/Splint Other Brace/Splint: hinged brace LLE (locked at 0), ortho PA reports brace can be unlocked when up and locked at night for sleeping.   Restrictions Weight Bearing Restrictions: Yes LLE Weight Bearing: Non weight bearing  See Function Navigator for Current Functional Status.   Therapy/Group: Individual Therapy  Peter Congoaylor Cashmere Harmes, PT, DPT  03/25/2018, 10:18 AM

## 2018-03-25 NOTE — IPOC Note (Signed)
Overall Plan of Care [facility](IPOC) Patient Details Name: Hannah Valentine MRN: [id number] DOB: 12/19/1955  Admitting Diagnosis: <principal problem not specified> left tib-fib fracture Hospital Problems: Active Problems:   Tibia/fibula fracture     Functional Problem List: Nursing Bowel, Skin Integrity, Safety, Pain  PT Balance, Sensory, Edema, Endurance, Motor, Pain  OT Balance, Endurance, Motor, Pain, Safety  SLP    TR         Basic ADL's: OT Grooming, Bathing, Dressing, Toileting     Advanced  ADL's: OT Simple Meal Preparation     Transfers: PT Bed Mobility, Bed to Chair, Car, Furniture, Floor  OT Toilet, Research scientist (life sciences)Tub/Shower     Locomotion: PT Ambulation, Psychologist, prison and probation servicesWheelchair Mobility, Stairs     Additional Impairments: OT None  SLP        TR      Anticipated Outcomes Item Anticipated Outcome  Self Feeding    Swallowing      Basic self-care  Supervision/setup  Toileting  Supervision/setup   Bathroom Transfers Supervision  Bowel/Bladder  Patient will continue to be continent of bowel and bladder during admission  Transfers  supervision   Locomotion  supervision  Communication     Cognition     Pain  Patient will be pain free or pain less than 3  Safety/Judgment  Patient will be free from falls and adhere to safety plan   Therapy Plan: PT Intensity: Minimum of 1-2 x/day ,45 to 90 minutes PT Frequency: 5 out of 7 days PT Duration Estimated Length of Stay: 7-9 days OT Intensity: Minimum of 1-2 x/day, 45 to 90 minutes OT Frequency: 5 out of 7 days OT Duration/Estimated Length of Stay: 7-10 days      Team Interventions: Nursing Interventions Bowel Management, Pain Management, Skin Care/Wound Management, Medication Management  PT interventions Ambulation/gait training, DME/adaptive equipment instruction, Neuromuscular re-education, UE/LE Strength taining/ROM, Wheelchair propulsion/positioning, Warden/rangerBalance/vestibular training, Discharge planning, Pain management, Skin care/wound  management, Therapeutic Activities, UE/LE Coordination activities, Disease management/prevention, Functional mobility training, Patient/family education, Splinting/orthotics, Therapeutic Exercise  OT Interventions Balance/vestibular training, Discharge planning, DME/adaptive equipment instruction, Functional mobility training, Patient/family education, Pain management, Psychosocial support, Self Care/advanced ADL retraining, Splinting/orthotics, Therapeutic Activities, Therapeutic Exercise, Wheelchair propulsion/positioning  SLP Interventions    TR Interventions    SW/CM Interventions Discharge Planning, Psychosocial Support, Patient/Family Education   Barriers to Discharge MD  Wound care  Nursing      PT Home environment access/layout    OT Home environment access/layout pt lives in split level home  SLP      SW Decreased caregiver support husband has a back issue   Team Discharge Planning: Destination: PT-Home ,OT- Home , SLP-  Projected Follow-up: PT-Home health PT, OT-  Home health OT, 24 hour supervision/assistance, SLP-  Projected Equipment Needs: PT-To be determined, OT- 3 in 1 bedside comode, Tub/shower seat, SLP-  Equipment Details: PT- , OT-  Patient/family involved in discharge planning: PT- Patient,  OT-Patient, SLP-   MD ELOS: 7-10 days Medical Rehab Prognosis:  Excellent Assessment: The patient has been admitted for CIR therapies with the diagnosis of left tib-fib fx. The team will be addressing functional mobility, strength, stamina, balance, safety, adaptive techniques and equipment, self-care, bowel and bladder mgt, patient and caregiver education, pain mgt, ortho precautions, wound care, orthotic use. Goals have been set at supervision for mobility and self-care.    Hannah OysterZachary T. Brianda Beitler, MD, FAAPMR      See Team Conference Notes for weekly updates to the plan of care

## 2018-03-25 NOTE — Progress Notes (Signed)
[place] PHYSICAL MEDICINE & REHABILITATION     PROGRESS NOTE    Subjective/Complaints: Feeling much better today. Moved bowels yesterday. Asked about her lasix dosing.   ROS: Patient denies fever, rash, sore throat, blurred vision, nausea, vomiting, diarrhea, cough, shortness of breath or chest pain, joint or back pain, headache, or mood change.   Objective:  No results found. Recent Labs    03/23/18 0612  WBC 5.4  HGB 10.0*  HCT 31.4*  PLT 261   Recent Labs    03/24/18 0719 03/25/18 0617  NA 131* 140  K 4.5 3.6  CL 101 107  GLUCOSE 411* 126*  BUN 32* 22*  CREATININE 1.59* 1.47*  CALCIUM 8.4* 8.5*   CBG (last 3)  Recent Labs    03/24/18 1204 03/24/18 1630 03/24/18 2056  GLUCAP 199* 108* 134*    Wt Readings from Last 3 Encounters:  03/25/18 95.3 kg (210 lb 1.6 oz)  03/22/18 95 kg (209 lb 7 oz)  10/04/17 93.4 kg (205 lb 14.6 oz)     Intake/Output Summary (Last 24 hours) at 03/25/2018 1049 Last data filed at 03/25/2018 0844 Gross per 24 hour  Intake 840 ml  Output -  Net 840 ml    Vital Signs: Blood pressure (!) 145/48, pulse 63, temperature 98.4 F (36.9 C), temperature source Oral, resp. rate 18, height 5\' 7"  (1.702 m), weight 95.3 kg (210 lb 1.6 oz), SpO2 98 %. Physical Exam:  Constitutional: No distress . Vital signs reviewed. HEENT: EOMI, oral membranes moist Neck: supple Cardiovascular: RRR without murmur. No JVD    Respiratory: CTA Bilaterally without wheezes or rales. Normal effort    GI: BS +, non-tender, non-distended  Musculoskeletal: She exhibits edema.  Tender left lower leg  Neurological: She is alert and oriented to person, place, and time.  UE 5/5. RLE: 4/5. LLE: limited by pain 1-2/5, wiggles toes. . No sensory findings  Skin:  Scattered wounds, incisions LLE. Left leg LESS warm/erythematous Psychiatric: She has a normal mood and affect. Her behavior is normal.     Assessment/Plan: 1. Functional deficits secondary to left  tib-fib fx which require 3+ hours per day of interdisciplinary therapy in a comprehensive inpatient rehab setting. Physiatrist is providing close team supervision and 24 hour management of active medical problems listed below. Physiatrist and rehab team continue to assess barriers to discharge/monitor patient progress toward functional and medical goals.  Function:  Bathing Bathing position   Position: (seated in recliner)  Bathing parts Body parts bathed by patient: Right arm, Left arm, Chest, Abdomen, Front perineal area, Buttocks, Right upper leg, Back    Bathing assist Assist Level: Touching or steadying assistance(Pt > 75%)      Upper Body Dressing/Undressing Upper body dressing   What is the patient wearing?: Button up shirt         Button up shirt - Perfomed by patient: Thread/unthread right sleeve, Thread/unthread left sleeve, Pull shirt around back, Button/unbutton shirt      Upper body assist Assist Level: Set up   Set up : To obtain clothing/put away  Lower Body Dressing/Undressing Lower body dressing   What is the patient wearing?: Non-skid slipper socks         Non-skid slipper socks- Performed by patient: Don/doff right sock Non-skid slipper socks- Performed by helper: Don/doff left sock                  Lower body assist        Toileting  Toileting          Toileting assist     Transfers Chair/bed transfer   Chair/bed transfer method: Stand pivot Chair/bed transfer assist level: Touching or steadying assistance (Pt > 75%) Chair/bed transfer assistive device: Armrests, Patent attorney Ambulation activity did not occur: Safety/medical concerns         Wheelchair   Type: Manual Max wheelchair distance: 150 ft Assist Level: Supervision or verbal cues  Cognition Comprehension Comprehension assist level: Follows complex conversation/direction with no assist  Expression Expression assist level: Expresses complex ideas:  With no assist  Social Interaction Social Interaction assist level: Interacts appropriately with others - No medications needed.  Problem Solving Problem solving assist level: Solves complex problems: Recognizes & self-corrects  Memory Memory assist level: Complete Independence: No helper   Medical Problem List and Plan:  1. Decreased functional mobility secondary to left tibia-fibula fracture after fall status post ORIF 03/12/2018. Non-weightbearing with hinged knee brace.  -continue therapies 2. DVT Prophylaxis/Anticoagulation: Subcutaneous heparin. Venous Doppler studies negative  3. Pain Management: Oxycodone as needed  4. Mood: Provide emotional support  5. Neuropsych: This patient is capable of making decisions on her own behalf.  6. Skin/Wound Care: Routine skin checks    -  cellulitis LLE   -continue empiric keflex 7. Fluids/Electrolytes/Nutrition: Routine in and outs with follow-up chemistries  8. Acute on chronic anemia. Follow-up CBC. Continue iron supplement  9.Marland KitchenID/CMV infection. Continue Valganciclovir per infectious disease as well as Bactrim x21 days. Follow-up transplant nephrologist as outpatient  10. Diabetes mellitus. Patient with insulin pump. Diabetic coordinator follow-up. Hemoglobin A1c 5.8.  -sugars somewhat labile at this point 11. CKD stage III with history of kidney transplant 2017.   Continue Prograf and chronic prednisone  -Cr 1.47   -weight starting to trend up slightly. She's on 80mg  lasix daily at home. Will resume 20mg  daily today keeping close attention to weights, Na+, Cr.  12. Hypertension. Norvasc 10 mg daily, Cardura 4 mg daily, hydralazine 100 mg every 8 hours, imdur 60 mg twice daily, Lopressor 50 mg twice daily.  -fair control at present  13. Hyperlipidemia. Pravachol  14. Constipation.Results with SSE yesterday  -continue prophylactic regimen      LOS (Days) 3 A FACE TO FACE EVALUATION WAS PERFORMED  Ranelle Oyster, MD 03/25/2018 10:49 AM

## 2018-03-25 NOTE — Progress Notes (Addendum)
Physical Therapy Session Note  Patient Details  Name: Hannah GellDonna Rech MRN: [id number] Date of Birth: 04/21/1956  Today's Date: 03/25/2018 PT Individual Time: 1500-1550 PT Individual Time Calculation (min): 50 min   Short Term Goals: Week 1:  PT Short Term Goal 1 (Week 1): STG=LTG    Skilled Therapeutic Interventions/Progress Updates:   Therapeutic exercises performed with LLE to increase strength for functional mobility: AROM DF/PF, toe extension x 10 each.  Gentle PROM L ankle DF.   . W/c propulsion over level tile using bil UEs, x 100'x 2, with min assist for steering.  PT instructed pt in techniques for steering and efficiency, with good retention.  Pt's L foot noted to be edematous;  erythematous (blanches) bil malleoli.  Marissa NestlePam Love, PA assessed pt's ankle; Pam will follow up PRN.   PT performed retrograde massage L foot, then added an ACE wrap to foot to prevent edema below ankle, and reapplied wrap up to knee.   Pt left resting in w/c with all needs within reach.    Therapy Documentation Precautions:  Precautions Precautions: Fall Required Braces or Orthoses: Other Brace/Splint Other Brace/Splint: hinged brace LLE (locked at 0), ortho PA reports brace can be unlocked when up and locked at night for sleeping.   Restrictions Weight Bearing Restrictions: Yes LLE Weight Bearing: Non weight bearing  Pain: " a bit" LLE ; RN checked in with pt during tx; she will take Tylenol later      See Function Navigator for Current Functional Status.   Therapy/Group: Individual Therapy  [name] 03/25/2018, 4:24 PM

## 2018-03-25 NOTE — Progress Notes (Signed)
Physical Therapy Session Note  Patient Details  Name: Hannah Valentine MRN: [id number] Date of Birth: 1956/07/11  Today's Date: 03/25/2018 PT Individual Time: 1015-1100 PT Individual Time Calculation (min): 60 min   Short Term Goals: Week 1:  PT Short Term Goal 1 (Week 1): STG=LTG  Skilled Therapeutic Interventions/Progress Updates:   Pt received sitting in WC and agreeable to PT  WC mobility 2x 295f with BUE propulsion and supervision assist from PT. Min verbal cues from PT for improved use of momentum with WC propulsion to reduce overall energy expenditure.   Sit<>stand transfer training with CGA from PT x 5 throughout treatment . Standing tolerance with 1 UE support x 4-5 min while engaged in fine motor task and cognitive task of connect four. Close supervision assist from PT for safety throughout standing tolerance trial with intermittent cues for NWB through the LLE.   Gait training with RW 157fx 2, BUE support on RW and min assist from PT for safety. Min verbal cues from PT for swing to gait pattern and RW management.   [facility] returned to room and [facility] reach and all needs met.            Therapy Documentation Precautions:  Precautions Precautions: Fall Required Braces or Orthoses: Other Brace/Splint Other Brace/Splint: hinged brace LLE (locked at 0), ortho PA reports brace can be unlocked when up and locked at night for sleeping.   Restrictions Weight Bearing Restrictions: Yes LLE Weight Bearing: Non weight bearing Pain: Pain Assessment Pain Scale: 0-10 Pain Score: 3  Pain Type: Acute pain;Surgical pain Pain Location: Leg Pain Orientation: Left Pain Intervention(s): RN made aware   See Function Navigator for Current Functional Status.   Therapy/Group: Individual Therapy  AuLorie Phenix/20/2019, 10:43 AM

## 2018-03-25 NOTE — Progress Notes (Signed)
Recreational Therapy Session Note  Patient Details  Name: Hannah Valentine MRN: [id number] Date of Birth: 03-06-1956 Today's Date: 03/25/2018 Time:  [phone] Pain: no c/o Skilled Therapeutic Interventions/Progress Updates: Met with pt to discuss TR services, activity analysis with potential modifications.  Pt appreciative of the information but not interested in further TR services.  Will continue to monitor through team.  Therapy/Group: Individual Therapy  Vanna Shavers 03/25/2018, 11:51 AM

## 2018-03-25 NOTE — Plan of Care (Signed)
Pt continues to be alert and orient x4. Rated pain as a 2 but wanted tylenol before bed. Pt continues to decline heparin injection. Left foot continues to be elevated to reduce 3+ edema. Teds ordered for left leg only. Full sensation to BLE present. Insulin pump intact and working properly.

## 2018-03-25 NOTE — Progress Notes (Signed)
Occupational Therapy Session Note  Patient Details  Name: Lorane GellDonna Manlove MRN: [id number] Date of Birth: 10/29/1955  Today's Date: 03/25/2018 OT Individual Time: 1305-1350 OT Individual Time Calculation (min): 45 min    Short Term Goals: Week 1:  OT Short Term Goal 1 (Week 1): STG = LTGs due to ELOS  Skilled Therapeutic Interventions/Progress Updates:    Treatment session with focus on LB dressing with AE and problem solving accessing home bathroom.  Pt received upright in w/c more alert and agreeable to therapy session.  Educated on use of reacher for LB dressing with pt able to thread LLE first with use of reacher and cues for setup.  Completed sit > stand with RW with min guard demonstrating ability to pull pants over hips while maintaining NWB on LLE.  Pt reports pleased with ability to don pants.  Pt provided pictures of bathroom setup and engaged in discussion regarding either placing a chair in bathroom or in hallway as w/c will not fit through 28" doorway.  To access bathroom pt will need to ambulate ~10' with a 90* turn.  Discussed setup modifications with plan to complete during next OT session, pt in agreement.    Therapy Documentation Precautions:  Precautions Precautions: Fall Required Braces or Orthoses: Other Brace/Splint Other Brace/Splint: hinged brace LLE (locked at 0), ortho PA reports brace can be unlocked when up and locked at night for sleeping.   Restrictions Weight Bearing Restrictions: Yes LLE Weight Bearing: Non weight bearing Pain:  Pt with no c/o pain at rest, increased pain in LLE with movement however refusing pain meds  See Function Navigator for Current Functional Status.   Therapy/Group: Individual Therapy  Rosalio LoudHOXIE, Gerhardt Gleed 03/25/2018, 2:50 PM

## 2018-03-26 ENCOUNTER — Inpatient Hospital Stay (HOSPITAL_COMMUNITY): Payer: Medicare Other

## 2018-03-26 ENCOUNTER — Inpatient Hospital Stay (HOSPITAL_COMMUNITY): Payer: Medicare Other | Admitting: Physical Therapy

## 2018-03-26 ENCOUNTER — Inpatient Hospital Stay (HOSPITAL_COMMUNITY): Payer: Medicare Other | Admitting: Occupational Therapy

## 2018-03-26 LAB — RENAL FUNCTION PANEL
ANION GAP: 8 (ref 5–15)
Albumin: 2.8 g/dL — ABNORMAL LOW (ref 3.5–5.0)
BUN: 28 mg/dL — ABNORMAL HIGH (ref 6–20)
CHLORIDE: 108 mmol/L (ref 101–111)
CO2: 23 mmol/L (ref 22–32)
Calcium: 8.7 mg/dL — ABNORMAL LOW (ref 8.9–10.3)
Creatinine, Ser: 1.59 mg/dL — ABNORMAL HIGH (ref 0.44–1.00)
GFR calc non Af Amer: 34 mL/min — ABNORMAL LOW (ref >60)
GFR, EST AFRICAN AMERICAN: 39 mL/min — AB (ref >60)
GLUCOSE: 134 mg/dL — AB (ref 65–99)
Phosphorus: 3.7 mg/dL (ref 2.5–4.6)
Potassium: 3.9 mmol/L (ref 3.5–5.1)
Sodium: 139 mmol/L (ref 135–145)

## 2018-03-26 LAB — GLUCOSE, CAPILLARY
GLUCOSE-CAPILLARY: 188 mg/dL — AB (ref 65–99)
GLUCOSE-CAPILLARY: 62 mg/dL — AB (ref 65–99)
Glucose-Capillary: 133 mg/dL — ABNORMAL HIGH (ref 65–99)
Glucose-Capillary: 66 mg/dL (ref 65–99)
Glucose-Capillary: 74 mg/dL (ref 65–99)

## 2018-03-26 NOTE — Progress Notes (Signed)
[place] PHYSICAL MEDICINE & REHABILITATION     PROGRESS NOTE    Subjective/Complaints: Complains that BSC is too small for her. Pain levels controlled. Noticed that her urine is a little darker in color  ROS: Patient denies fever, rash, sore throat, blurred vision, nausea, vomiting, diarrhea, cough, shortness of breath or chest pain, joint or back pain, headache, or mood change. .   Objective:  No results found. No results for input(s): WBC, HGB, HCT, PLT in the last 72 hours. Recent Labs    03/25/18 0617 03/26/18 0633  NA 140 139  K 3.6 3.9  CL 107 108  GLUCOSE 126* 134*  BUN 22* 28*  CREATININE 1.47* 1.59*  CALCIUM 8.5* 8.7*   CBG (last 3)  Recent Labs    03/25/18 1645 03/25/18 2110 03/26/18 0652  GLUCAP 172* 112* 133*    Wt Readings from Last 3 Encounters:  03/26/18 91.9 kg (202 lb 9.6 oz)  03/22/18 95 kg (209 lb 7 oz)  10/04/17 93.4 kg (205 lb 14.6 oz)     Intake/Output Summary (Last 24 hours) at 03/26/2018 1030 Last data filed at 03/26/2018 0745 Gross per 24 hour  Intake 600 ml  Output -  Net 600 ml    Vital Signs: Blood pressure (!) 123/42, pulse 69, temperature 98.4 F (36.9 C), temperature source Oral, resp. rate 18, height 5\' 7"  (1.702 m), weight 91.9 kg (202 lb 9.6 oz), SpO2 97 %. Physical Exam:  Constitutional: No distress . Vital signs reviewed. HEENT: EOMI, oral membranes moist Neck: supple Cardiovascular: RRR without murmur. No JVD    Respiratory: CTA Bilaterally without wheezes or rales. Normal effort    GI: BS +, non-tender, non-distended  Musculoskeletal: She exhibits edema.   Tender left lower leg  Neurological: She is alert and oriented to person, place, and time.  UE 5/5. RLE: 4/5. LLE: limited by pain 1-2/5, wiggles toes. . No sensory findings  Skin:  Scattered wounds, incisions LLE. Left leg with decr erythema Psychiatric: She has a normal mood and affect. Her behavior is normal.     Assessment/Plan: 1. Functional deficits  secondary to left tib-fib fx which require 3+ hours per day of interdisciplinary therapy in a comprehensive inpatient rehab setting. Physiatrist is providing close team supervision and 24 hour management of active medical problems listed below. Physiatrist and rehab team continue to assess barriers to discharge/monitor patient progress toward functional and medical goals.  Function:  Bathing Bathing position   Position: (seated in recliner)  Bathing parts Body parts bathed by patient: Right arm, Left arm, Chest, Abdomen, Front perineal area, Buttocks, Right upper leg, Back    Bathing assist Assist Level: Touching or steadying assistance(Pt > 75%)      Upper Body Dressing/Undressing Upper body dressing   What is the patient wearing?: Bra, Pull over shirt/dress Bra - Perfomed by patient: Thread/unthread right bra strap, Thread/unthread left bra strap, Hook/unhook bra (pull down sports bra)   Pull over shirt/dress - Perfomed by patient: Thread/unthread right sleeve, Thread/unthread left sleeve, Put head through opening, Pull shirt over trunk   Button up shirt - Perfomed by patient: Thread/unthread right sleeve, Thread/unthread left sleeve, Pull shirt around back, Button/unbutton shirt      Upper body assist Assist Level: Set up   Set up : To obtain clothing/put away  Lower Body Dressing/Undressing Lower body dressing   What is the patient wearing?: Pants     Pants- Performed by patient: Thread/unthread right pants leg, Thread/unthread left pants leg,  Pull pants up/down   Non-skid slipper socks- Performed by patient: Don/doff right sock Non-skid slipper socks- Performed by helper: Don/doff left sock                  Lower body assist Assist for lower body dressing: Touching or steadying assistance (Pt > 75%)      Toileting Toileting   Toileting steps completed by patient: Performs perineal hygiene Toileting steps completed by helper: Adjust clothing prior to toileting,  Adjust clothing after toileting(per Eliseo Squires, NT)    Toileting assist     Transfers Chair/bed transfer   Chair/bed transfer method: Stand pivot Chair/bed transfer assist level: Touching or steadying assistance (Pt > 75%) Chair/bed transfer assistive device: Armrests, Patent attorney Ambulation activity did not occur: Safety/medical concerns   Max distance: 52ft Assist level: Touching or steadying assistance (Pt > 75%)   Wheelchair   Type: Manual Max wheelchair distance: 200 Assist Level: Supervision or verbal cues  Cognition Comprehension Comprehension assist level: Follows complex conversation/direction with no assist  Expression Expression assist level: Expresses complex ideas: With no assist  Social Interaction Social Interaction assist level: Interacts appropriately with others - No medications needed.  Problem Solving Problem solving assist level: Solves complex problems: Recognizes & self-corrects  Memory Memory assist level: Complete Independence: No helper   Medical Problem List and Plan:  1. Decreased functional mobility secondary to left tibia-fibula fracture after fall status post ORIF 03/12/2018. Non-weightbearing with hinged knee brace.  -continue therapies 2. DVT Prophylaxis/Anticoagulation: Subcutaneous heparin. Venous Doppler studies negative  3. Pain Management: Oxycodone as needed  4. Mood: Provide emotional support  5. Neuropsych: This patient is capable of making decisions on her own behalf.  6. Skin/Wound Care: Routine skin checks    -  cellulitis LLE with improvement   -continue empiric keflex through weekend and re-eval Monday 7. Fluids/Electrolytes/Nutrition: Routine in and outs with follow-up chemistries  8. Acute on chronic anemia. Follow-up CBC. Continue iron supplement  9.Marland KitchenID/CMV infection. Continue Valganciclovir per infectious disease as well as Bactrim x21 days. Follow-up transplant nephrologist as outpatient  10. Diabetes  mellitus. Patient with insulin pump. Diabetic coordinator follow-up. Hemoglobin A1c 5.8.  -sugars with some improvement 11. CKD stage III with history of kidney transplant 2017.   Continue Prograf and chronic prednisone  -Cr 1.59  6/21 which appears to be near baseline - Filed Weights   03/24/18 0500 03/25/18 0436 03/26/18 0500  Weight: 93.2 kg (205 lb 7.5 oz) 95.3 kg (210 lb 1.6 oz) 91.9 kg (202 lb 9.6 oz)   -. She's on 80mg  lasix daily at home. Resumed 20mg  daily 6/20 as weights were creeping up a bit and some LE edema noted.   -continue daily labs 12. Hypertension. Norvasc 10 mg daily, Cardura 4 mg daily, hydralazine 100 mg every 8 hours, imdur 60 mg twice daily, Lopressor 50 mg twice daily.  -fair control at present  13. Hyperlipidemia. Pravachol  14. Constipation.Results with SSE   -continue prophylactic regimen      LOS (Days) 4 A FACE TO FACE EVALUATION WAS PERFORMED  Ranelle Oyster, MD 03/26/2018 10:30 AM

## 2018-03-26 NOTE — Progress Notes (Signed)
Physical Therapy Session Note  Patient Details  Name: Hannah GellDonna Valentine MRN: [id number] Date of Birth: 12/09/1955  Today's Date: 03/26/2018 PT Individual Time: 1005-1035 PT Individual Time Calculation (min): 30 min   Short Term Goals: Week 1:  PT Short Term Goal 1 (Week 1): STG=LTG  Skilled Therapeutic Interventions/Progress Updates:    d/c planning discussed in regards to mobility in the home and functional use of RW vs w/c. Seated exercises with LLE to promote increased knee flexion with self ROM and active assisted with pillowcase under foot and education on pt managing LE and removal of legrests for set up. Engaged in 10 reps x 2 sets. Edema control discussed as well in home environment. Pt performed transfer to recliner (short distance gait with RW) with overall min assist and set up with pillows for elevated position of LLE. [facility] pt and RN request.   Therapy Documentation Precautions:  Precautions Precautions: Fall Required Braces or Orthoses: Other Brace/Splint Other Brace/Splint: hinged brace LLE (locked at 0), ortho PA reports brace can be unlocked when up and locked at night for sleeping.   Restrictions Weight Bearing Restrictions: Yes LLE Weight Bearing: Non weight bearing Pain: Reports pain in LLE - premedicated.   See Function Navigator for Current Functional Status.   Therapy/Group: Individual Therapy  Karolee StampsGray, Anastasha Ortez Darrol PokeBrescia  Jarvin Ogren B. Jafari Mckillop, PT, DPT  03/26/2018, 10:44 AM

## 2018-03-26 NOTE — Significant Event (Signed)
Patient refusing SQ heparin.

## 2018-03-26 NOTE — Progress Notes (Signed)
Occupational Therapy Session Note  Patient Details  Name: Hannah Valentine MRN: [id number] Date of Birth: 07/16/1956  Today's Date: 03/26/2018 OT Individual Time: 1305-1400 OT Individual Time Calculation (min): 55 min    Short Term Goals: Week 1:  OT Short Term Goal 1 (Week 1): STG = LTGs due to ELOS  Skilled Therapeutic Interventions/Progress Updates:    Treatment session with focus on functional transfers and LLE ankle ROM.  Pt received upright in recliner willing to engage in therapy session.  Engaged in functional mobility to simulate access to home bathroom with ambulating 4-5' then turning 90* then ambulating additional 4-5'.  Pt able to complete with supervision while maintaining NWB through LLE.  Pt reports increased pain in Rt wrist with ambulation.  Returned to recliner in same fashion with supervision.  Engaged in LLE ankle ROM with focus on dorsiflexion and plantar flexion as well as inversion/eversion with use of theraband with tactile cues to further stretch.  Applied ice to wrist and notified RN of persistent pain post standing or ambulation.  Therapy Documentation Precautions:  Precautions Precautions: Fall Required Braces or Orthoses: Other Brace/Splint Other Brace/Splint: hinged brace LLE (locked at 0), ortho PA reports brace can be unlocked when up and locked at night for sleeping.   Restrictions Weight Bearing Restrictions: Yes LLE Weight Bearing: Non weight bearing General:   Vital Signs: Therapy Vitals Temp: 98 F (36.7 C) Temp Source: Oral Pulse Rate: (!) 59 Resp: 16 BP: (!) 147/58 Patient Position (if appropriate): Sitting Oxygen Therapy SpO2: 100 % O2 Device: Room Air Pain:  Pt with c/o pain in Rt wrist.  Notified RN.  See Function Navigator for Current Functional Status.   Therapy/Group: Individual Therapy  Rosalio LoudHOXIE, Hannah Valentine 03/26/2018, 4:31 PM

## 2018-03-26 NOTE — Progress Notes (Signed)
Social Work Patient ID: Hannah Valentine, female   DOB: [birth year], 62 y.o.   MRN: [id number]  Met with pt this morning to review team conference and d/c information.  Pt already aware of targeted d/c date of 6/27 and supervision goals overall.  Confirms her spouse is able to provide the supervision.  Does ask, "What if I'm not ready?  It's a few days off, I know, so we'll see."  Encouraged her to speak with the team about any concerns she may have as we approach d/c and we will discuss and address as needed.  She is very pleasant and hopeful she will be ready.    Shelvia Fojtik, LCSW

## 2018-03-27 ENCOUNTER — Inpatient Hospital Stay (HOSPITAL_COMMUNITY): Payer: Medicare Other | Admitting: Occupational Therapy

## 2018-03-27 ENCOUNTER — Inpatient Hospital Stay (HOSPITAL_COMMUNITY): Payer: Medicare Other

## 2018-03-27 DIAGNOSIS — S82201D Unspecified fracture of shaft of right tibia, subsequent encounter for closed fracture with routine healing: Secondary | ICD-10-CM

## 2018-03-27 DIAGNOSIS — R5381 Other malaise: Secondary | ICD-10-CM

## 2018-03-27 DIAGNOSIS — S82401D Unspecified fracture of shaft of right fibula, subsequent encounter for closed fracture with routine healing: Secondary | ICD-10-CM

## 2018-03-27 DIAGNOSIS — M25531 Pain in right wrist: Secondary | ICD-10-CM

## 2018-03-27 LAB — GLUCOSE, CAPILLARY
Glucose-Capillary: 123 mg/dL — ABNORMAL HIGH (ref 65–99)
Glucose-Capillary: 136 mg/dL — ABNORMAL HIGH (ref 65–99)
Glucose-Capillary: 105 mg/dL — ABNORMAL HIGH (ref 65–99)
Glucose-Capillary: 101 mg/dL — ABNORMAL HIGH (ref 65–99)

## 2018-03-27 NOTE — Progress Notes (Signed)
Orthopedic Tech Progress Note Patient Details:  Lorane GellDonna Archuletta 01/05/1956 [id number]  Ortho Devices Type of Ortho Device: Velcro wrist splint Ortho Device/Splint Location: rue Ortho Device/Splint Interventions: Application   Post Interventions Patient Tolerated: Well Instructions Provided: Care of device   Nikki DomCrawford, Pressley Barsky 03/27/2018, 9:13 AM

## 2018-03-27 NOTE — Progress Notes (Signed)
[place] PHYSICAL MEDICINE & REHABILITATION     PROGRESS NOTE    Subjective/Complaints: CC Right wrist pain.  Occurred after fall, pain with use of walker, able to move wrist with out pain, no numbness in fingers  ROS: Patient denies fever, rash, sore throat, blurred vision, nausea, vomiting, diarrhea, cough, shortness of breath or chest pain, joint or back pain, headache, or mood change. .   Objective:  No results found. No results for input(s): WBC, HGB, HCT, PLT in the last 72 hours. Recent Labs    03/25/18 0617 03/26/18 0633  NA 140 139  K 3.6 3.9  CL 107 108  GLUCOSE 126* 134*  BUN 22* 28*  CREATININE 1.47* 1.59*  CALCIUM 8.5* 8.7*   CBG (last 3)  Recent Labs    03/26/18 1809 03/26/18 2131 03/27/18 0700  GLUCAP 74 62* 123*    Wt Readings from Last 3 Encounters:  03/27/18 93.2 kg (205 lb 7.5 oz)  03/22/18 95 kg (209 lb 7 oz)  10/04/17 93.4 kg (205 lb 14.6 oz)     Intake/Output Summary (Last 24 hours) at 03/27/2018 0717 Last data filed at 03/26/2018 1825 Gross per 24 hour  Intake 600 ml  Output -  Net 600 ml    Vital Signs: Blood pressure (!) 168/54, pulse 65, temperature 98.2 F (36.8 C), temperature source Oral, resp. rate 18, height 5\' 7"  (1.702 m), weight 93.2 kg (205 lb 7.5 oz), SpO2 95 %. Physical Exam:  Constitutional: No distress . Vital signs reviewed. HEENT: EOMI, oral membranes moist Neck: supple Cardiovascular: RRR without murmur. No JVD    Respiratory: CTA Bilaterally without wheezes or rales. Normal effort    GI: BS +, non-tender, non-distended  Musculoskeletal: She exhibits edema.   Tender left lower leg  Neurological: She is alert and oriented to person, place, and time.  UE 5/5. RLE: 4/5. LLE: limited by pain 1-2/5, wiggles toes. . No sensory findings  Skin:  Scattered wounds, incisions LLE. Left leg with decr erythema Psychiatric: She has a normal mood and affect. Her behavior is normal.     Assessment/Plan: 1. Functional  deficits secondary to left tib-fib fx which require 3+ hours per day of interdisciplinary therapy in a comprehensive inpatient rehab setting. Physiatrist is providing close team supervision and 24 hour management of active medical problems listed below. Physiatrist and rehab team continue to assess barriers to discharge/monitor patient progress toward functional and medical goals.  Function:  Bathing Bathing position   Position: (seated in recliner)  Bathing parts Body parts bathed by patient: Right arm, Left arm, Chest, Abdomen, Front perineal area, Buttocks, Right upper leg, Back    Bathing assist Assist Level: Touching or steadying assistance(Pt > 75%)      Upper Body Dressing/Undressing Upper body dressing   What is the patient wearing?: Bra, Pull over shirt/dress Bra - Perfomed by patient: Thread/unthread right bra strap, Thread/unthread left bra strap, Hook/unhook bra (pull down sports bra)   Pull over shirt/dress - Perfomed by patient: Thread/unthread right sleeve, Thread/unthread left sleeve, Put head through opening, Pull shirt over trunk   Button up shirt - Perfomed by patient: Thread/unthread right sleeve, Thread/unthread left sleeve, Pull shirt around back, Button/unbutton shirt      Upper body assist Assist Level: Set up   Set up : To obtain clothing/put away  Lower Body Dressing/Undressing Lower body dressing   What is the patient wearing?: Pants     Pants- Performed by patient: Thread/unthread right pants leg, Thread/unthread  left pants leg, Pull pants up/down   Non-skid slipper socks- Performed by patient: Don/doff right sock Non-skid slipper socks- Performed by helper: Don/doff left sock                  Lower body assist Assist for lower body dressing: Touching or steadying assistance (Pt > 75%)      Toileting Toileting   Toileting steps completed by patient: Performs perineal hygiene Toileting steps completed by helper: Adjust clothing prior to  toileting, Adjust clothing after toileting    Toileting assist Assist level: Touching or steadying assistance (Pt.75%)   Transfers Chair/bed transfer   Chair/bed transfer method: Ambulatory Chair/bed transfer assist level: Touching or steadying assistance (Pt > 75%) Chair/bed transfer assistive device: Armrests, Walker, Orthosis     Locomotion Ambulation Ambulation activity did not occur: Safety/medical concerns   Max distance: 7410ft Assist level: Touching or steadying assistance (Pt > 75%)   Wheelchair   Type: Manual Max wheelchair distance: 200 Assist Level: Supervision or verbal cues  Cognition Comprehension Comprehension assist level: Follows complex conversation/direction with no assist  Expression Expression assist level: Expresses complex ideas: With no assist  Social Interaction Social Interaction assist level: Interacts appropriately with others - No medications needed.  Problem Solving Problem solving assist level: Solves complex problems: Recognizes & self-corrects  Memory Memory assist level: Complete Independence: No helper   Medical Problem List and Plan:  1. Decreased functional mobility secondary to left tibia-fibula fracture after fall status post ORIF 03/12/2018. Non-weightbearing with hinged knee brace.  -continue therapies PT, OT 2. DVT Prophylaxis/Anticoagulation: Subcutaneous heparin. Venous Doppler studies negative  3. Pain Management: Oxycodone as needed  4. Mood: Provide emotional support  5. Neuropsych: This patient is capable of making decisions on her own behalf.  6. Skin/Wound Care: Routine skin checks    -  cellulitis LLE with improvement   -continue empiric keflex through weekend and re-eval Monday 7. Fluids/Electrolytes/Nutrition: Routine in and outs with follow-up chemistries  8. Acute on chronic anemia. Follow-up CBC. Continue iron supplement  9.Marland Kitchen.ID/CMV infection. Continue Valganciclovir per infectious disease as well as Bactrim x21 days.  Follow-up transplant nephrologist as outpatient  10. Diabetes mellitus. Patient with insulin pump. Diabetic coordinator follow-up. Hemoglobin A1c 5.8.  -sugars with some improvement, pt self managing pump per Endo parameters 11. CKD stage III with history of kidney transplant 2017.   Continue Prograf and chronic prednisone  -Cr 1.59  6/21 which appears to be near baseline - Filed Weights   03/25/18 0436 03/26/18 0500 03/27/18 0500  Weight: 95.3 kg (210 lb 1.6 oz) 91.9 kg (202 lb 9.6 oz) 93.2 kg (205 lb 7.5 oz)   -. She's on 80mg  lasix daily at home. Resumed 20mg  daily 6/20 as weights were creeping up a bit and some LE edema noted.   -continue daily labs 12. Hypertension. Norvasc 10 mg daily, Cardura 4 mg daily, hydralazine 100 mg every 8 hours, imdur 60 mg twice daily, Lopressor 50 mg twice daily.  -fair control at present  Vitals:   03/26/18 2014 03/27/18 0600  BP: (!) 159/61 (!) 168/54  Pulse: 68 65  Resp: 16 18  Temp: 98.3 F (36.8 C) 98.2 F (36.8 C)  SpO2: 98% 95%  Elevated systolic monitor , should improve as Lasix dose increased 13. Hyperlipidemia. Pravachol  14. Constipation.Results with SSE   -continue prophylactic regimen 15.  RIght wrist pain ulnar styloid tenderness hx recent fall will check Xray, apply wrist splint     LOS (Days)  5 A FACE TO FACE EVALUATION WAS PERFORMED  Erick Colace, MD 03/27/2018 7:17 AM

## 2018-03-27 NOTE — Progress Notes (Signed)
Physical Therapy Session Note  Patient Details  Name: Hannah Valentine MRN: [id number] Date of Birth: 07/31/1956  Today's Date: 03/26/2018 PT Individual Time:  905-950 AND 1300-1400   45 min and 60 min   Short Term Goals: Week 1:  PT Short Term Goal 1 (Week 1): STG=LTG  Skilled Therapeutic Interventions/Progress Updates:  Session 1 Pt received supine in bed and agreeable to PT. PT performed LLE wrap for edema management and assisted pt to don knee brace with total assist. Lower body dressing instructed by PT with min assist to manage the LLE. Sit>supine with min assist for improved control of the LLE. Sit>stand EOB with supervision assist from slightly elevated bed height. Min assist for balance while pt managed pants up to waist. Stand pivot transfer to [facility] with close supervision assist from PT with min cues for AD management and technique. WC mobility x 149f to rehab gym with supervision assist and min cues for improved control to maintain straight path. Stand pivot transfer to Mat table in gym with supervision assist as listed above. Seated BLE therex inclduing LAQ x 10 with 3 sec hold at end range. Knee flexion to end ROM from elevated mat table to improve overall joint mobility, heel slide for active assisted ankle ROM into DF. [facility] returned to [facility] and all needs met.       Session 2.  Pt received sitting in recliner and agreeable to PT. [facility]. Min cues for safety into standing position. Pt instructed in WC mobility x 202fwith supervision assist from PT and min cues for improved turning control. PT instructed pt in standing balance/tolerance while engaged in Wii bowling 3 bouts x 7-10 min each, supervision assist overall with intermittent 1-2 UE support on RW. Sit<>stand transfers completed x 5 throughout treatment with supervision assist and BUE support on RW and arm rests. PT instructed pt in  gait training x 1020fith min assist from PT and min cues for improved use of BUE to prevent NWB through the LLE. WC mobility training in simulated community environment through hospital gift shop x [id number] supervision assist from PT with occassional cues for safety awareness and protection of the LLE. Patient returned to room and performed stand pivot to recliner with RW and supervision assist, pt then left sitting in recliner with call bell in reach and all needs met.          Therapy Documentation Precautions:  Precautions Precautions: Fall Required Braces or Orthoses: Other Brace/Splint Other Brace/Splint: hinged brace LLE (locked at 0), ortho PA reports brace can be unlocked when up and locked at night for sleeping.   Restrictions Weight Bearing Restrictions: Yes LLE Weight Bearing: Non weight bearing Vital Signs: Therapy Vitals Temp: 98.2 F (36.8 C) Temp Source: Oral Pulse Rate: 65 Resp: 18 BP: (!) 168/54 Patient Position (if appropriate): Sitting Oxygen Therapy SpO2: 95 % O2 Device: Room Air Pain: 4/10 L lower leg. Surgical aching.   See Function Navigator for Current Functional Status.   Therapy/Group: Individual Therapy  AustLorie Phenix2/2019, 7:15 AM

## 2018-03-27 NOTE — Progress Notes (Signed)
Occupational Therapy Session Note  Patient Details  Name: Hannah Valentine MRN: [id number] Date of Birth: 02/05/1956  Today's Date: 03/27/2018 OT Individual Time: 7829-56210800-0916 OT Individual Time Calculation (min): 76 min    Short Term Goals: Week 1:  OT Short Term Goal 1 (Week 1): STG = LTGs due to ELOS  Skilled Therapeutic Interventions/Progress Updates:    Pt completed transfer from recliner to wheelchair stand pivot with min assist using the RW.  She reported increased right wrist pain with sit to stand and with use of the RW.  Therapist took pt down to the dayroom where she worked with the UE ergonometer for first part of session.  Pt completed 2 sets of 3-4 mins each with resistance on level 8.  Minimal pain in the right wrist reported but she did state cramping in the left forearm where her fistula was at.  While resting discussed home setup and technique for transferring into the walk-in shower with seat facing out. Therapist added platform attachment to the right side of the walker to help decrease wrist pain.  Pt completed functional mobility with the RW for up to 10' with min assist before fatiguing and needing to sit down.  Completed this X 2 with pt reporting less pain in her wrist with the added platform.  Next had pt complete 2 sets of 20 repetitions for each arm while sitting in the wheelchair with use of the bright orange therapy band.  Min instructional cueing for technique.  Finished session with return to the room with pt transferring to the bedside recliner with min assist.  Call button and phone in reach.  Ortho tech came at end of session as well to provide wrist cock-up splint for the RUE to help with positioning.    Therapy Documentation Precautions:  Precautions Precautions: Fall Required Braces or Orthoses: Other Brace/Splint Other Brace/Splint: hinged brace LLE (locked at 0), ortho PA reports brace can be unlocked when up and locked at night for sleeping.    Restrictions Weight Bearing Restrictions: Yes LLE Weight Bearing: Non weight bearing  Pain: Pain Assessment Pain Scale: 0-10 Pain Score: 3  Pain Type: Acute pain;Surgical pain Pain Location: Leg Pain Orientation: Left Pain Intervention(s): Medication (See eMAR);RN made aware ADL: See Function Navigator for Current Functional Status.   Therapy/Group: Individual Therapy  Deshaun Schou OTR/L 03/27/2018, 12:40 PM

## 2018-03-28 ENCOUNTER — Inpatient Hospital Stay (HOSPITAL_COMMUNITY): Payer: Medicare Other | Admitting: Physical Therapy

## 2018-03-28 ENCOUNTER — Inpatient Hospital Stay (HOSPITAL_COMMUNITY): Payer: Medicare Other

## 2018-03-28 LAB — RENAL FUNCTION PANEL
ANION GAP: 8 (ref 5–15)
Albumin: 2.8 g/dL — ABNORMAL LOW (ref 3.5–5.0)
BUN: 27 mg/dL — ABNORMAL HIGH (ref 6–20)
CHLORIDE: 110 mmol/L (ref 101–111)
CO2: 21 mmol/L — AB (ref 22–32)
CREATININE: 1.35 mg/dL — AB (ref 0.44–1.00)
Calcium: 8.5 mg/dL — ABNORMAL LOW (ref 8.9–10.3)
GFR calc non Af Amer: 41 mL/min — ABNORMAL LOW (ref >60)
GFR, EST AFRICAN AMERICAN: 48 mL/min — AB (ref >60)
Glucose, Bld: 119 mg/dL — ABNORMAL HIGH (ref 65–99)
Phosphorus: 3.1 mg/dL (ref 2.5–4.6)
Potassium: 3.7 mmol/L (ref 3.5–5.1)
Sodium: 139 mmol/L (ref 135–145)

## 2018-03-28 LAB — GLUCOSE, CAPILLARY
GLUCOSE-CAPILLARY: 102 mg/dL — AB (ref 65–99)
Glucose-Capillary: 132 mg/dL — ABNORMAL HIGH (ref 65–99)
Glucose-Capillary: 60 mg/dL — ABNORMAL LOW (ref 65–99)
Glucose-Capillary: 51 mg/dL — ABNORMAL LOW (ref 65–99)
Glucose-Capillary: 104 mg/dL — ABNORMAL HIGH (ref 65–99)

## 2018-03-28 NOTE — Progress Notes (Signed)
Occupational Therapy Session Note  Patient Details  Name: Hannah Valentine MRN: [id number] Date of Birth: 1956/06/10  Today's Date: 03/28/2018 OT Individual Time: 1400-1457 OT Individual Time Calculation (min): 57 min    Short Term Goals: Week 1:  OT Short Term Goal 1 (Week 1): STG = LTGs due to ELOS  Skilled Therapeutic Interventions/Progress Updates:    1:1. Pt supine in bed with no c/o pain. Pt supine to sitting with A to manage RLE. Pt completes stand pivot transfers throughout session EOB>w/c>recliner with VC for hand placement and anterior trunk flexion. Pt completes all w/c propulsion throughout session with breaks d/t decreased endurance. Pt completes 4 rounds of corn hole to improve sit to stand with supervision and VC for trunk flexion prior to sit to stand. Pt overall supervision during dynamic reaching for bean bags. Pt and OT discuss bathroom set up and shower. Pt reports planning on sponge bathing when home. OT encourages pt to have [place] problem solve transfer when pt feeling more "comfortable." Exited session with pt seated in reclienr call light in reach and all needs met  Therapy Documentation Precautions:  Precautions Precautions: Fall Required Braces or Orthoses: Other Brace/Splint Other Brace/Splint: hinged brace LLE (locked at 0), ortho PA reports brace can be unlocked when up and locked at night for sleeping.   Restrictions Weight Bearing Restrictions: Yes LLE Weight Bearing: Non weight bearing  See Function Navigator for Current Functional Status.   Therapy/Group: Individual Therapy  Tonny Branch 03/28/2018, 2:59 PM

## 2018-03-28 NOTE — Progress Notes (Addendum)
[facility] PHYSICAL MEDICINE & REHABILITATION     PROGRESS NOTE    Subjective/Complaints: Discussed wrist pain, improved in splint, ulnar side, old radial fx per pt ROS: No CP, SOB, N/V/D  Objective:  Dg Wrist 2 Views Right  Result Date: 03/27/2018 CLINICAL DATA:  Pt is having pain on the medial side of her right wrist, pt came to the hospital for a fall at home and she thinks she may have injured her wrist in the fall but it just recently began hurting. EXAM: RIGHT WRIST - 2 VIEW COMPARISON:  None. FINDINGS: No definite fracture. There is subtle trabecular irregularity traverse late along the distal radial metaphysis. This may be a chronic finding related to remote trauma. An acute microtrabecular fracture is possible. The joints are normally spaced and aligned. No arthropathic changes. Soft tissues are unremarkable. IMPRESSION: 1. No definite fracture.  No dislocation. 2. Subtle trabecular irregularity along the distal radial metaphysis. This is likely a chronic finding. However, if the patient has pain persists or is significantly clinically suspicious for fracture, follow up right wrist MRI be recommended. Electronically Signed   By: Amie Portland M.D.   On: 03/27/2018 09:34   No results for input(s): WBC, HGB, HCT, PLT in the last 72 hours. Recent Labs    03/26/18 0633  NA 139  K 3.9  CL 108  GLUCOSE 134*  BUN 28*  CREATININE 1.59*  CALCIUM 8.7*   CBG (last 3)  Recent Labs    03/27/18 1642 03/27/18 2159 03/28/18 0611  GLUCAP 105* 101* 132*    Wt Readings from Last 3 Encounters:  03/28/18 98.1 kg (216 lb 4.3 oz)  03/22/18 95 kg (209 lb 7 oz)  10/04/17 93.4 kg (205 lb 14.6 oz)     Intake/Output Summary (Last 24 hours) at 03/28/2018 0727 Last data filed at 03/27/2018 1700 Gross per 24 hour  Intake 720 ml  Output -  Net 720 ml    Vital Signs: Blood pressure (!) 154/60, pulse 65, temperature 97.7 F (36.5 C), temperature source Oral, resp. rate 20, height 5\' 7"   (1.702 m), weight 98.1 kg (216 lb 4.3 oz), SpO2 99 %. Physical Exam:  Constitutional: No distress . Vital signs reviewed. HEENT: EOMI, oral membranes moist Neck: supple Cardiovascular: RRR without murmur. No JVD    Respiratory: CTA Bilaterally without wheezes or rales. Normal effort    GI: BS +, non-tender, non-distended  Musculoskeletal: She exhibits edema.   Tender left lower leg  Neurological: She is alert and oriented to person, place, and time.  UE 5/5. RLE: 4/5. LLE: limited by pain 1-2/5, wiggles toes except Left great toe  . Marland Kitchen Hypersensitivity Left great toe Skin:  Scattered wounds, incisions LLE. Left leg with decr erythema Psychiatric: She has a normal mood and affect. Her behavior is normal.     Assessment/Plan: 1. Functional deficits secondary to left tib-fib fx which require 3+ hours per day of interdisciplinary therapy in a comprehensive inpatient rehab setting. Physiatrist is providing close team supervision and 24 hour management of active medical problems listed below. Physiatrist and rehab team continue to assess barriers to discharge/monitor patient progress toward functional and medical goals.  Function:  Bathing Bathing position   Position: (seated in recliner)  Bathing parts Body parts bathed by patient: Right arm, Left arm, Chest, Abdomen, Front perineal area, Buttocks, Right upper leg, Back    Bathing assist Assist Level: Touching or steadying assistance(Pt > 75%)      Upper Body Dressing/Undressing Upper  body dressing   What is the patient wearing?: Bra, Pull over shirt/dress Bra - Perfomed by patient: Thread/unthread right bra strap, Thread/unthread left bra strap, Hook/unhook bra (pull down sports bra)   Pull over shirt/dress - Perfomed by patient: Thread/unthread right sleeve, Thread/unthread left sleeve, Put head through opening, Pull shirt over trunk   Button up shirt - Perfomed by patient: Thread/unthread right sleeve, Thread/unthread left  sleeve, Pull shirt around back, Button/unbutton shirt      Upper body assist Assist Level: Set up   Set up : To obtain clothing/put away  Lower Body Dressing/Undressing Lower body dressing   What is the patient wearing?: Pants     Pants- Performed by patient: Thread/unthread right pants leg, Thread/unthread left pants leg, Pull pants up/down   Non-skid slipper socks- Performed by patient: Don/doff right sock Non-skid slipper socks- Performed by helper: Don/doff left sock                  Lower body assist Assist for lower body dressing: Touching or steadying assistance (Pt > 75%)      Toileting Toileting   Toileting steps completed by patient: Performs perineal hygiene Toileting steps completed by helper: Adjust clothing after toileting    Toileting assist Assist level: Touching or steadying assistance (Pt.75%)   Transfers Chair/bed transfer   Chair/bed transfer method: Ambulatory Chair/bed transfer assist level: Touching or steadying assistance (Pt > 75%) Chair/bed transfer assistive device: Armrests, Walker, Orthosis     Locomotion Ambulation Ambulation activity did not occur: Safety/medical concerns   Max distance: 3510ft Assist level: Touching or steadying assistance (Pt > 75%)   Wheelchair   Type: Manual Max wheelchair distance: 200 Assist Level: Supervision or verbal cues  Cognition Comprehension Comprehension assist level: Follows complex conversation/direction with no assist  Expression Expression assist level: Expresses complex ideas: With no assist  Social Interaction Social Interaction assist level: Interacts appropriately with others - No medications needed.  Problem Solving Problem solving assist level: Solves complex problems: Recognizes & self-corrects  Memory Memory assist level: Complete Independence: No helper   Medical Problem List and Plan:  1. Decreased functional mobility secondary to left tibia-fibula fracture after fall status post ORIF  03/12/2018. Non-weightbearing with hinged knee brace. 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 DVT Prophylaxis/Anticoagulation: Subcutaneous heparin. Venous Doppler studies negative  3. Pain Management: Oxycodone as needed , Right wrist sprain may ice 4. Mood: Provide emotional support  5. Neuropsych: This patient is capable of making decisions on her own behalf.  6. Skin/Wound Care: Routine skin checks    -  cellulitis LLE with improvement   -continue empiric keflex through weekend and re-eval Monday 7. Fluids/Electrolytes/Nutrition: Routine in and outs with follow-up chemistries  8. Acute on chronic anemia. Follow-up CBC. Continue iron supplement  9.Marland Kitchen.ID/CMV infection. Continue Valganciclovir per infectious disease as well as Bactrim x21 days. Follow-up transplant nephrologist as outpatient  10. Diabetes mellitus. Patient with insulin pump. Diabetic coordinator follow-up. Hemoglobin A1c 5.8.  -sugars with some improvement, pt self managing pump per Endo parameters 11. CKD stage III with history of kidney transplant 2017.   Continue Prograf and chronic prednisone  -Cr 1.59  6/21 which appears to be near baseline - Filed Weights   03/26/18 0500 03/27/18 0500 03/28/18 0500  Weight: 91.9 kg (202 lb 9.6 oz) 93.2 kg (205 lb 7.5 oz) 98.1 kg (216 lb 4.3 oz)   -. She's on 80mg  lasix daily at home.  Resumed 20mg  daily 6/20 as weights were creeping up a bit and some LE edema noted.   -continue daily labs 12. Hypertension. Norvasc 10 mg daily, Cardura 4 mg daily, hydralazine 100 mg every 8 hours, imdur 60 mg twice daily, Lopressor 50 mg twice daily.  -fair control at present  Vitals:   03/27/18 1940 03/28/18 0431  BP: (!) 154/56 (!) 154/60  Pulse: 69 65  Resp: 18 20  Temp: 99.2 F (37.3 C) 97.7 F (36.5 C)  SpO2: 97% 99%  Elevated systolic monitor , should improve as Lasix dose increased 13. Hyperlipidemia. Pravachol  14.  Constipation.Results with SSE   -continue prophylactic regimen 15.  RIght wrist pain ulnar styloid tenderness , normal Xray, apply wrist splint for stprain     LOS (Days) 6 A FACE TO FACE EVALUATION WAS PERFORMED  Erick Colace, MD 03/28/2018 7:27 AM

## 2018-03-28 NOTE — Progress Notes (Addendum)
Physical Therapy Session Note  Patient Details  Name: Hannah Valentine MRN: [id number] Date of Birth: 01/22/1956  Today's Date: 03/28/2018 PT Individual Time: 0700-0814 and 1000-1100 PT Individual Time Calculation (min): 74 min and 60 min  Short Term Goals: Week 1:  PT Short Term Goal 1 (Week 1): STG=LTG  Skilled Therapeutic Interventions/Progress Updates:    pt requests Lt LE be rewrapped to reduce edema.  PT rewraps Lt LE from distal foot to upper thigh for edema control.  PT applies Lt LE brace.  Pt requires mod A to don pants, min A to stand with PFRW and min A to pull up pants.  Stand pivot transfers throughout session with min A, cues for UE placement, pt requires increased time especially from low surfaces.  W/c mobility in controlled environment 150' x 2 with supervision.  Standing tolerance and Lt hip AROM for flex/ext and abd/add all 3 x 10 with sit <> stand repetitions in between.  Pt tolerated treatment well. Left in recliner with needs at hand.  Session 2: pt c/o Lt LE pain with mobility, refuses pain meds.  Pt performs sit <> stand and stand pivot transfers with steadying assist with PFRW.  W/c mobility with supervision throughout unit.  Supine therex for AAROM for Lt knee, hip and ankle per MD orders with pt able to tolerate increased ROM throughout session.  Pt left in room with husband present, alarm set, needs at hand.  Therapy Documentation Precautions:  Precautions Precautions: Fall Required Braces or Orthoses: Other Brace/Splint Other Brace/Splint: hinged brace LLE (locked at 0), ortho PA reports brace can be unlocked when up and locked at night for sleeping.   Restrictions Weight Bearing Restrictions: Yes LLE Weight Bearing: Non weight bearing Pain:  pt c/o pain when touching Lt LE, pain eases without light touch    Therapy/Group: Individual Therapy  [name] 03/28/2018, 8:15 AM

## 2018-03-29 ENCOUNTER — Inpatient Hospital Stay (HOSPITAL_COMMUNITY): Payer: Medicare Other | Admitting: Occupational Therapy

## 2018-03-29 ENCOUNTER — Inpatient Hospital Stay (HOSPITAL_COMMUNITY): Payer: Medicare Other | Admitting: Physical Therapy

## 2018-03-29 DIAGNOSIS — S82401S Unspecified fracture of shaft of right fibula, sequela: Secondary | ICD-10-CM

## 2018-03-29 DIAGNOSIS — D62 Acute posthemorrhagic anemia: Secondary | ICD-10-CM

## 2018-03-29 DIAGNOSIS — D638 Anemia in other chronic diseases classified elsewhere: Secondary | ICD-10-CM

## 2018-03-29 DIAGNOSIS — N183 Chronic kidney disease, stage 3 unspecified: Secondary | ICD-10-CM

## 2018-03-29 DIAGNOSIS — S82201S Unspecified fracture of shaft of right tibia, sequela: Secondary | ICD-10-CM

## 2018-03-29 DIAGNOSIS — M25531 Pain in right wrist: Secondary | ICD-10-CM

## 2018-03-29 DIAGNOSIS — I10 Essential (primary) hypertension: Secondary | ICD-10-CM

## 2018-03-29 LAB — RENAL FUNCTION PANEL
ANION GAP: 10 (ref 5–15)
Albumin: 2.7 g/dL — ABNORMAL LOW (ref 3.5–5.0)
BUN: 23 mg/dL — AB (ref 6–20)
CALCIUM: 8.5 mg/dL — AB (ref 8.9–10.3)
CO2: 21 mmol/L — AB (ref 22–32)
Chloride: 109 mmol/L (ref 101–111)
Creatinine, Ser: 1.23 mg/dL — ABNORMAL HIGH (ref 0.44–1.00)
GFR calc Af Amer: 53 mL/min — ABNORMAL LOW (ref >60)
GFR calc non Af Amer: 46 mL/min — ABNORMAL LOW (ref >60)
GLUCOSE: 140 mg/dL — AB (ref 65–99)
Phosphorus: 3.4 mg/dL (ref 2.5–4.6)
Potassium: 3.7 mmol/L (ref 3.5–5.1)
SODIUM: 140 mmol/L (ref 135–145)

## 2018-03-29 LAB — GLUCOSE, CAPILLARY
GLUCOSE-CAPILLARY: 96 mg/dL (ref 65–99)
GLUCOSE-CAPILLARY: 42 mg/dL — AB (ref 65–99)
GLUCOSE-CAPILLARY: 42 mg/dL — AB (ref 65–99)
GLUCOSE-CAPILLARY: 45 mg/dL — AB (ref 65–99)
GLUCOSE-CAPILLARY: 77 mg/dL (ref 65–99)
GLUCOSE-CAPILLARY: 150 mg/dL — AB (ref 65–99)
Glucose-Capillary: 70 mg/dL (ref 65–99)
Glucose-Capillary: 40 mg/dL — CL (ref 65–99)

## 2018-03-29 MED ORDER — GLUCOSE 4 G PO CHEW
CHEWABLE_TABLET | ORAL | Status: AC
  Filled 2018-03-29: qty 1

## 2018-03-29 NOTE — Significant Event (Signed)
Hypoglycemic Event  CBG: 42  Treatment: 3 glucose tabs and 15 GM carbohydrate snack  Symptoms: None  Follow-up CBG:42   Time: 12:45 pm CBG Result:70   Possible Reasons for Event: Unknown  Comments/MD notified:Hypoglycemia protocol in place     Hannah Valentine

## 2018-03-29 NOTE — Progress Notes (Addendum)
[facility] PHYSICAL MEDICINE & REHABILITATION     PROGRESS NOTE    Subjective/Complaints: Patient seen lying in bed this morning. She states he slept well overnight. She states she had a good weekend.  ROS: denies CP, SOB, nausea, vomiting, diarrhea.  Objective:  Dg Wrist 2 Views Right  Result Date: 03/27/2018 CLINICAL DATA:  Pt is having pain on the medial side of her right wrist, pt came to the hospital for a fall at home and she thinks she may have injured her wrist in the fall but it just recently began hurting. EXAM: RIGHT WRIST - 2 VIEW COMPARISON:  None. FINDINGS: No definite fracture. There is subtle trabecular irregularity traverse late along the distal radial metaphysis. This may be a chronic finding related to remote trauma. An acute microtrabecular fracture is possible. The joints are normally spaced and aligned. No arthropathic changes. Soft tissues are unremarkable. IMPRESSION: 1. No definite fracture.  No dislocation. 2. Subtle trabecular irregularity along the distal radial metaphysis. This is likely a chronic finding. However, if the patient has pain persists or is significantly clinically suspicious for fracture, follow up right wrist MRI be recommended. Electronically Signed   By: Amie Portland M.D.   On: 03/27/2018 09:34   No results for input(s): WBC, HGB, HCT, PLT in the last 72 hours. Recent Labs    03/28/18 0923 03/29/18 0725  NA 139 140  K 3.7 3.7  CL 110 109  GLUCOSE 119* 140*  BUN 27* 23*  CREATININE 1.35* 1.23*  CALCIUM 8.5* 8.5*   CBG (last 3)  Recent Labs    03/28/18 1742 03/28/18 2133 03/29/18 0642  GLUCAP 104* 102* 96    Wt Readings from Last 3 Encounters:  03/29/18 93 kg (205 lb 0.4 oz)  03/22/18 95 kg (209 lb 7 oz)  10/04/17 93.4 kg (205 lb 14.6 oz)     Intake/Output Summary (Last 24 hours) at 03/29/2018 1610 Last data filed at 03/28/2018 1700 Gross per 24 hour  Intake 480 ml  Output -  Net 480 ml    Vital Signs: Blood pressure  (!) 148/43, pulse 70, temperature 98.5 F (36.9 C), temperature source Oral, resp. rate 17, height 5\' 7"  (1.702 m), weight 93 kg (205 lb 0.4 oz), SpO2 98 %. Physical Exam:  Constitutional: No distress . Vital signs reviewed. HENT: Normocephalic.  Atraumatic. Eyes: EOMI. No discharge. Cardiovascular: RRR. No JVD. Respiratory: CTA Bilaterally. Normal effort. GI: BS +. Non-distended. Musc: edema in left foot. Neurological: Alert and oriented Motor: bilateral upper extremities: 5/5 proximal distal Right lower extremity: 4+/5 proximal to distal Left lower extremity: Hip flexion 1/5, wiggles toes (pain inhibition) Sensation intact to light touch Skin: wounds healing Psychiatric: normal mood, normal behavior.  Assessment/Plan: 1. Functional deficits secondary to left tib-fib fx which require 3+ hours per day of interdisciplinary therapy in a comprehensive inpatient rehab setting. Physiatrist is providing close team supervision and 24 hour management of active medical problems listed below. Physiatrist and rehab team continue to assess barriers to discharge/monitor patient progress toward functional and medical goals.  Function:  Bathing Bathing position   Position: (seated in recliner)  Bathing parts Body parts bathed by patient: Right arm, Left arm, Chest, Abdomen, Front perineal area, Buttocks, Right upper leg, Back    Bathing assist Assist Level: Touching or steadying assistance(Pt > 75%)      Upper Body Dressing/Undressing Upper body dressing   What is the patient wearing?: Bra, Pull over shirt/dress Bra - Perfomed by patient: Thread/unthread  right bra strap, Thread/unthread left bra strap, Hook/unhook bra (pull down sports bra)   Pull over shirt/dress - Perfomed by patient: Thread/unthread right sleeve, Thread/unthread left sleeve, Put head through opening, Pull shirt over trunk   Button up shirt - Perfomed by patient: Thread/unthread right sleeve, Thread/unthread left sleeve,  Pull shirt around back, Button/unbutton shirt      Upper body assist Assist Level: Set up   Set up : To obtain clothing/put away  Lower Body Dressing/Undressing Lower body dressing   What is the patient wearing?: Pants     Pants- Performed by patient: Thread/unthread right pants leg, Thread/unthread left pants leg, Pull pants up/down   Non-skid slipper socks- Performed by patient: Don/doff right sock Non-skid slipper socks- Performed by helper: Don/doff left sock                  Lower body assist Assist for lower body dressing: Touching or steadying assistance (Pt > 75%)      Toileting Toileting   Toileting steps completed by patient: Performs perineal hygiene Toileting steps completed by helper: Adjust clothing after toileting    Toileting assist Assist level: Touching or steadying assistance (Pt.75%)   Transfers Chair/bed transfer   Chair/bed transfer method: Ambulatory Chair/bed transfer assist level: Touching or steadying assistance (Pt > 75%) Chair/bed transfer assistive device: Armrests, Walker, Orthosis     Locomotion Ambulation Ambulation activity did not occur: Safety/medical concerns   Max distance: 63ft Assist level: Touching or steadying assistance (Pt > 75%)   Wheelchair   Type: Manual Max wheelchair distance: 200 Assist Level: Supervision or verbal cues  Cognition Comprehension Comprehension assist level: Follows complex conversation/direction with no assist  Expression Expression assist level: Expresses complex ideas: With no assist  Social Interaction Social Interaction assist level: Interacts appropriately with others - No medications needed.  Problem Solving Problem solving assist level: Solves complex problems: Recognizes & self-corrects  Memory Memory assist level: Complete Independence: No helper   Medical Problem List and Plan:  1. Decreased functional mobility secondary to left tibia-fibula fracture after fall status post ORIF  03/12/2018. Non-weightbearing with hinged knee brace. 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 DVT Prophylaxis/Anticoagulation: Subcutaneous heparin. Venous Doppler studies negative  3. Pain Management: Oxycodone as needed , Right wrist sprain may ice 4. Mood: Provide emotional support  5. Neuropsych: This patient is capable of making decisions on her own behalf.  6. Skin/Wound Care: Routine skin checks    Cellulitis LLE with improvement, empiric keflex DC'd on 6/24  Discussed appropriate dressing with Ace wraps with nursing 7. Fluids/Electrolytes/Nutrition: Routine in and outs 8. Acute on chronic anemia. Continue iron supplement   Hb 10.0 on 6/18  Labs ordered for tomorrow  Continue to monitor 9.Marland KitchenID/CMV infection. Continue Valganciclovir per infectious disease as well as Bactrim x21 days. Follow-up transplant nephrologist as outpatient  10. Diabetes mellitus. Patient with insulin pump. Diabetic coordinator follow-up. Hemoglobin A1c 5.8.   Pt self managing pump per Endo parameters  Slightly labile at present 11. CKD stage III with history of kidney transplant 2017.   Continue Prograf and chronic prednisone   Creatinine 1.23 6/24 Filed Weights   03/27/18 0500 03/28/18 0500 03/29/18 0448  Weight: 93.2 kg (205 lb 7.5 oz) 98.1 kg (216 lb 4.3 oz) 93 kg (205 lb 0.4 oz)   ? Reliability  80mg  lasix daily at home, resumed 20mg  daily 6/20   Daily  labs 12. Hypertension. Norvasc 10 mg daily, Cardura 4 mg daily, hydralazine 100 mg every 8 hours, imdur 60 mg twice daily, Lopressor 50 mg twice daily.   Vitals:   03/29/18 0448 03/29/18 0848  BP: (!) 143/52 (!) 148/43  Pulse: 60 70  Resp: 17   Temp: 98.5 F (36.9 C)   SpO2: 98%    Labile, continue to monitor 13. Hyperlipidemia. Pravachol  14. Constipation.  -continue prophylactic regimen 15.  Right wrist pain ulnar styloid tenderness -improved  Xray  with? Abnormality, we'll consider MRI of pain persists  Apply wrist splint for sprain   LOS (Days) 7 A FACE TO FACE EVALUATION WAS PERFORMED  Ankit Karis JubaAnil Patel, MD 03/29/2018 9:22 AM

## 2018-03-29 NOTE — Progress Notes (Signed)
Occupational Therapy Session Note  Patient Details  Name: Hannah GellDonna [facility] MRN: [id number] Date of Birth: 10/30/1955  Today's Date: 03/29/2018 OT Individual Time: [id number], [id number] and [id number] OT Individual Time Calculation (min): 56 min, 43 min, and 30 min   Short Term Goals: Week 1:  OT Short Term Goal 1 (Week 1): STG = LTGs due to ELOS  Skilled Therapeutic Interventions/Progress Updates:    1) Treatment session with focus on LB dressing and functional transfers.  Pt received supine in bed reporting already bathed.  Therapist rewrapped LLE for edema management and then applied brace to LLE.  Pt donned pants at bed level with setup assist to utilize reacher.  Pt able to thread LLE with reacher and RLE without, completed sit > stand with PFRW to pull pants over hips.  Pt required increased time with LB dressing due to locking up of fingers on Lt hand, slowing down progress.  Pt ambulated to recliner with PFRW with close supervision.  Engaged in discussion regarding follow up therapy ([facility]OT) and pt questioning when she will have to leave her home for follow up MD appts.    2) Treatment session with focus on sit > stand and standing tolerance.  Pt received upright in recliner with husband present but leaving.  Pt completed stand pivot transfer to w/c with PFRW supervision.  Pt propelled ~200 before therapist assisting with remainder of distance due to discomfort in Rt wrist.  Engaged in table top activity with focus on sit <> stand and increased standing tolerance.  Pt able to tolerate standing 5 min, 6 min, and 5 mins during table top activity all with supervision.  Engaged in discussion regarding pt concerns with d/c home to ground floor of home (as she lives in split level home).  Pt expressing concerns of bathroom accessibility and well as physical demand on her husband to provide assist with her on ground level.  3) Treatment session with focus on sit <> stand and short distance ambulation  forward/backward.  Pt reports noting improvements with mobility and pain in LLE, however reports wanting to feel "Stronger" before d/c home.  Asked pt to quantify stronger, with pt stating she wants to stand longer and be able to maneuver easier with PFRW/RW.  Educated on current goals and prolonged recovery time due to NWB with pt reporting understanding as well as challenges with pt living in split level home as well as recommendation for 3 in 1 to reduce burden of ambulating into bathroom.  Engaged in sit <> stand with supervision.  Pt ambulated forward/backward 4' x3 as pt reports increased difficulty with going backwards.  Supervision while ambulating forwards and min guard backwards.  Returned to sitting in recliner with LLE elevated and all needs in reach.  Therapy Documentation Precautions:  Precautions Precautions: Fall Required Braces or Orthoses: Other Brace/Splint Other Brace/Splint: hinged brace LLE (locked at 0), ortho PA reports brace can be unlocked when up and locked at night for sleeping.   Restrictions Weight Bearing Restrictions: Yes LLE Weight Bearing: Non weight bearing General:   Vital Signs: Therapy Vitals Pulse Rate: 70 BP: (!) 148/43 Pain: Pt reports no pain at rest, some grimacing with movement but pt not rating pain.    See Function Navigator for Current Functional Status.   Therapy/Group: Individual Therapy  Rosalio LoudHOXIE, Brisha Mccabe 03/29/2018, 10:44 AM

## 2018-03-29 NOTE — Significant Event (Signed)
Hypoglycemic Event  CBG: 42  Treatment: 15 GM carbohydrate snack  Symptoms: None  Follow-up CBG: Time:12:33PM CBG Result:42  Possible Reasons for Event: CBG uncontrolled since having the insulin pump. No insulin given per patient today.  Comments/MD notified:MD aware of situation    Hannah Valentine

## 2018-03-29 NOTE — Progress Notes (Signed)
Physical Therapy Session Note  Patient Details  Name: Hannah Valentine MRN: [id number] Date of Birth: 10/13/1955  Today's Date: 03/29/2018 PT Individual Time: 1100-1220 PT Individual Time Calculation (min): 80 min   Short Term Goals: Week 1:  PT Short Term Goal 1 (Week 1): STG=LTG  Skilled Therapeutic Interventions/Progress Updates: Pt presented in recliner agreeable to therapy. Per pt increased pain/swelling in R wrist after yesterday's therapies. Pt request to more Wii/cornhole games. Performed stand pivot recliner to w/c min guard and pt propelled to rehab gym supervision. Blocked practice with and without wrist brace, per pt able to manage PFRW easier without brace. Provided pt with home measurement sheet and discussed home set up for d/c. Pt indicated only vehicle is [employer] with foot runner. Discussed sleeping arrangements with pt as pt still unsure of wants hospital bed or will sleep in recliner. Adv to please let us know if wants hospital bed. Sitting EOB performed LAQ 2 x 10 bilaterally, supine hip flexion 2 x 10, AA SLR on LLE x 10, SLR with 3# wt 2 x 10 RLE., DF with manual stretch x 10.  Pt returned to sitting mod I and performed stand pivot to w/c. Pt propelled back to room supervision. Pt returned to recliner in same manner as prior. Pt's husband awaiting in room, PTA provided home measurement sheet to husband. Husband voiced concerns regarding d/c as husband wants pt to live upstairs where pt would be more comfortable and have easier access to bathroom and somewhere to sleep. PTA voiced concerns regarding safety with managing stairs. Adv pt and wife to come to agreement on living arrangement then we can try to problem solve. Pt left in recliner with lunch tray set up and husband present with needs met.      Therapy Documentation Precautions:  Precautions Precautions: Fall Required Braces or Orthoses: Other Brace/Splint Other Brace/Splint: hinged brace LLE (locked at 0), ortho PA  reports brace can be unlocked when up and locked at night for sleeping.   Restrictions Weight Bearing Restrictions: Yes LLE Weight Bearing: Non weight bearing General:   Vital Signs: Therapy Vitals Temp: 98.2 F (36.8 C) Temp Source: Oral Pulse Rate: (!) 57 Resp: 18 BP: (!) 111/46 Patient Position (if appropriate): Sitting Oxygen Therapy SpO2: 100 % O2 Device: Room Air  See Function Navigator for Current Functional Status.   Therapy/Group: Individual Therapy  Rosita DeChalus  Rosita DeChalus, PTA  03/29/2018, 4:14 PM  

## 2018-03-30 ENCOUNTER — Inpatient Hospital Stay (HOSPITAL_COMMUNITY): Payer: Medicare Other | Admitting: Occupational Therapy

## 2018-03-30 ENCOUNTER — Inpatient Hospital Stay (HOSPITAL_COMMUNITY): Payer: Medicare Other | Admitting: Physical Therapy

## 2018-03-30 DIAGNOSIS — E1169 Type 2 diabetes mellitus with other specified complication: Secondary | ICD-10-CM

## 2018-03-30 DIAGNOSIS — E669 Obesity, unspecified: Secondary | ICD-10-CM

## 2018-03-30 LAB — CBC WITH DIFFERENTIAL/PLATELET
BAND NEUTROPHILS: 0 %
BASOS ABS: 0.1 10*3/uL (ref 0.0–0.1)
BASOS PCT: 1 %
BLASTS: 0 %
EOS ABS: 0.2 10*3/uL (ref 0.0–0.7)
Eosinophils Relative: 3 %
HEMATOCRIT: 32.8 % — AB (ref 36.0–46.0)
Hemoglobin: 10.4 g/dL — ABNORMAL LOW (ref 12.0–15.0)
Lymphocytes Relative: 15 %
Lymphs Abs: 0.8 10*3/uL (ref 0.7–4.0)
MCH: 31.5 pg (ref 26.0–34.0)
MCHC: 31.7 g/dL (ref 30.0–36.0)
MCV: 99.4 fL (ref 78.0–100.0)
METAMYELOCYTES PCT: 0 %
MONO ABS: 0.2 10*3/uL (ref 0.1–1.0)
Monocytes Relative: 3 %
Myelocytes: 0 %
NEUTROS ABS: 3.9 10*3/uL (ref 1.7–7.7)
Neutrophils Relative %: 78 %
Other: 0 %
PLATELETS: 263 10*3/uL (ref 150–400)
PROMYELOCYTES RELATIVE: 0 %
RBC: 3.3 MIL/uL — ABNORMAL LOW (ref 3.87–5.11)
RDW: 15.5 % (ref 11.5–15.5)
WBC: 5.2 10*3/uL (ref 4.0–10.5)
nRBC: 0 /100 WBC

## 2018-03-30 LAB — RENAL FUNCTION PANEL
ANION GAP: 7 (ref 5–15)
Albumin: 2.7 g/dL — ABNORMAL LOW (ref 3.5–5.0)
BUN: 21 mg/dL (ref 8–23)
CO2: 22 mmol/L (ref 22–32)
Calcium: 8.6 mg/dL — ABNORMAL LOW (ref 8.9–10.3)
Chloride: 110 mmol/L (ref 98–111)
Creatinine, Ser: 1.3 mg/dL — ABNORMAL HIGH (ref 0.44–1.00)
GFR calc non Af Amer: 43 mL/min — ABNORMAL LOW (ref >60)
GFR, EST AFRICAN AMERICAN: 50 mL/min — AB (ref >60)
GLUCOSE: 109 mg/dL — AB (ref 70–99)
POTASSIUM: 3.4 mmol/L — AB (ref 3.5–5.1)
Phosphorus: 3.6 mg/dL (ref 2.5–4.6)
Sodium: 139 mmol/L (ref 135–145)

## 2018-03-30 LAB — GLUCOSE, CAPILLARY
GLUCOSE-CAPILLARY: 62 mg/dL — AB (ref 70–99)
GLUCOSE-CAPILLARY: 92 mg/dL (ref 70–99)
GLUCOSE-CAPILLARY: 73 mg/dL (ref 70–99)
Glucose-Capillary: 92 mg/dL (ref 70–99)

## 2018-03-30 NOTE — Progress Notes (Signed)
Occupational Therapy Session Note  Patient Details  Name: Hannah Valentine MRN: [id number] Date of Birth: 01/04/1956  Today's Date: 03/30/2018 OT Individual Time: 0935-1030 and 1530-1550 OT Individual Time Calculation (min): 55 min and 20 min   Short Term Goals: Week 1:  OT Short Term Goal 1 (Week 1): STG = LTGs due to ELOS  Skilled Therapeutic Interventions/Progress Updates:    1) Treatment session with focus on functional mobility, transfers, and LB dressing.  Pt received with RN about to transfer to toilet.  Pt ambulated with PFRW to toilet with supervision, completed transfer on to regular toilet with grab bars with supervision.  Pt completed toileting tasks with clothing management and hygiene with distant supervision.  Returned to recliner with PFRW with supervision, except min guard when navigating threshold out of bathroom.  Therapist rewrapped LLE for edema management and reapplied knee brace to LLE.  Pt donned pants seated in recliner with use of reacher to thread LLE and then standing with PFRW with supervision when pulling pants over hips.   Pt reports just feeling "slow" today.  Left upright in recliner with legs elevated.  2) Pt visibly frustrated upon arrival.  Pt reports frustration that d/c date has been conveyed to her as an "anticipated" d/c date and that she reports she does not feel she or her home are ready.  Discussed pt current goals and challenge being home setup.  Pt reporting "it's fine" "it doesn't matter" when asked to clarify how therapy can make her feel more ready for d/c home.  Pt reports she will leave tomorrow as she is so frustrated and "pissed".  Encouraged pt to discuss with SWK her concerns.  Pt also stating concern regarding sutures as once she d/cs home she does not plan to leave for awhile.  Educated on medical concerns with sutures staying in too long, therefore asked nurse as well as PA to clarify when sutures were to be d/ced.  Pt missed final 10 mins due to  frustration and reporting need to call her husband to arrange d/c for tomorrow.    Pt did speak with SWK and plans to remain till Friday to allow for further strengthening and arrangements to be made at home.  Await clarification for suture removal as well.  Therapy Documentation Precautions:  Precautions Precautions: Fall Required Braces or Orthoses: Other Brace/Splint Other Brace/Splint: hinged brace LLE (locked at 0), ortho PA reports brace can be unlocked when up and locked at night for sleeping.   Restrictions Weight Bearing Restrictions: Yes LLE Weight Bearing: Non weight bearing General:   Vital Signs: Therapy Vitals Pulse Rate: 69 BP: (!) 124/47 Pain:  Pt with c/o pain in Rt wrist, pt declining pain meds utilizing massage and brace.  See Function Navigator for Current Functional Status.   Therapy/Group: Individual Therapy  Rosalio LoudHOXIE, Ann Bohne 03/30/2018, 10:46 AM

## 2018-03-30 NOTE — Progress Notes (Signed)
Physical Therapy Session Note  Patient Details  Name: Hannah Valentine MRN: [id number] Date of Birth: 04/21/1956  Today's Date: 03/30/2018 PT Individual Time: 1445-1530 and 1100-1200 PT Individual Time Calculation (min): 45 min and 60 min  Short Term Goals: Week 1:  PT Short Term Goal 1 (Week 1): STG=LTG  Skilled Therapeutic Interventions/Progress Updates: Tx1:  Pt presented in recliner agreeable to therapy. Pt stating feeling a little "woozy" contributed to just waking up from nap. Performed sit to stand from recliner requiring multiple attempts to get to standing. Performed pivot with min guard. Upon sitting pt stating feeling light-headed. PTA notified NT checked BS 52. PTA and pt discussed at length living arrangements as pt now wishes to bump up and live upstairs vs downstairs. PTA transported pt to rehab gym and pt verbalized how she would bump up stairs and across living room to couch where there is a small platform (dog step) to get up to couch. Discussed with pt safety risks and questioned if pt would have enough endurance to perform that that bumping on ground. Also discussed with pt most likely increased pain in R wrist due to the increased use of R hand. Pt verbalized understanding however continues to voice desire to try bumping around "as this is how I did it last time I hurt my knee". Adv pt will attempt transfer to stairs and bumping up/down stairs during pm session. Pt propelled back to room supervision and performed stand pivot transfer to recliner. Pt positioned to comfort and left with NT present to re-check BS.    Tx2: Pt presented in recliner agreeable to therapy. Pt states feeling more alert than compared to previous session. Pt performed stand pivot to w/c with min guard. Pt indicated has pictures regarding home set up. PTA asked what decision has been made for transport re: d/c Thursday. Pt becoming irate indicating it was not communicated to her that d/c was Thursday and that her  understanding was it was an "anticipated d/c" subject to change. Pt then stating "let's do whatever you want because it doesn't matter". PTA attempting to appease pt asking what we can do to help however pt too frustrated at moment. PTA indicating to trial steps to practice as this is what pt wants to do upon d/c. Pt propelled to rehab gym and was able to perform stand pivot to descend to second step. Pt able to demonstrate bumping up two steps and down 2 steps with supervision. Pt unable to return to standing from step as pt states "these are smaller than my own", in discussion discovered that pt's steps are deeper. PTA placed second 6in step to extend length and pt was able to successfully come to standing with supervision. Discussed car transfer with pt, as pt's husband will be renting SUV similar to an Explorer for d/c. Pt indicated in enter driver rear seat and scoot back. Practiced transfer from elevated (28in height) with min A from PTA for LLE management. Pt returned to w/c and propelled back to room with hand off to OT.   Provided info to OT as what transpired during PTA session as pt continued to express feeling of frustration at situation. Also informed LSW of current situation and pt's desire to d/c asap.   LSW spoke with pt, pt agreed for d/c Friday 6/28 to allow time for home set up and additional strengthening.       Therapy Documentation Precautions:  Precautions Precautions: Fall Required Braces or Orthoses: Other Brace/Splint Other Brace/Splint: hinged  brace LLE (locked at 0), ortho PA reports brace can be unlocked when up and locked at night for sleeping.   Restrictions Weight Bearing Restrictions: Yes LLE Weight Bearing: Non weight bearing General:   Vital Signs: Therapy Vitals Temp: 98.5 F (36.9 C) Temp Source: Oral Pulse Rate: (!) 58 Resp: 17 BP: (!) 143/57 Patient Position (if appropriate): Sitting Oxygen Therapy SpO2: 96 % O2 Device: Room Air Pain: Pain  Assessment Pain Scale: 0-10 Pain Score: 0-No pain Mobility:   Locomotion :    Trunk/Postural Assessment :    Balance:   Exercises:   Other Treatments:     See Function Navigator for Current Functional Status.   Therapy/Group: Individual Therapy  Sianna Garofano 03/30/2018, 4:16 PM

## 2018-03-30 NOTE — Progress Notes (Signed)
[facility] PHYSICAL MEDICINE & REHABILITATION     PROGRESS NOTE    Subjective/Complaints: Patient seen sitting up in bed this morning. She states she slept well overnight. She has questions regarding her diuretic.  ROS: Denies CP, SOB, nausea, vomiting, diarrhea.  Objective:  No results found. No results for input(s): WBC, HGB, HCT, PLT in the last 72 hours. Recent Labs    03/28/18 0923 03/29/18 0725  NA 139 140  K 3.7 3.7  CL 110 109  GLUCOSE 119* 140*  BUN 27* 23*  CREATININE 1.35* 1.23*  CALCIUM 8.5* 8.5*   CBG (last 3)  Recent Labs    03/29/18 1825 03/29/18 2116 03/30/18 0657  GLUCAP 77 150* 92    Wt Readings from Last 3 Encounters:  03/30/18 90.8 kg (200 lb 2.8 oz)  03/22/18 95 kg (209 lb 7 oz)  10/04/17 93.4 kg (205 lb 14.6 oz)     Intake/Output Summary (Last 24 hours) at 03/30/2018 0819 Last data filed at 03/29/2018 1738 Gross per 24 hour  Intake 480 ml  Output -  Net 480 ml    Vital Signs: Blood pressure (!) 134/57, pulse 63, temperature 98 F (36.7 C), temperature source Oral, resp. rate 17, height 5\' 7"  (1.702 m), weight 90.8 kg (200 lb 2.8 oz), SpO2 96 %. Physical Exam:  Constitutional: No distress . Vital signs reviewed. HENT: Normocephalic.  Atraumatic. Eyes: EOMI. No discharge. Cardiovascular: RRR. No JVD. Respiratory: CTA Bilaterally. Normal effort. GI: BS +. Non-distended. Musc: edema in left foot (improved). Neurological: Alert and oriented Motor: bilateral upper extremities: 5/5 proximal distal, except for right wrist which is braced Right lower extremity: 4+/5 proximal to distal Left lower extremity: Hip flexion 1/5, wiggles toes (some pain inhibition) Skin: wounds healing Psychiatric: normal mood, normal behavior.  Assessment/Plan: 1. Functional deficits secondary to left tib-fib fx which require 3+ hours per day of interdisciplinary therapy in a comprehensive inpatient rehab setting. Physiatrist is providing close team  supervision and 24 hour management of active medical problems listed below. Physiatrist and rehab team continue to assess barriers to discharge/monitor patient progress toward functional and medical goals.  Function:  Bathing Bathing position   Position: (seated in recliner)  Bathing parts Body parts bathed by patient: Right arm, Left arm, Chest, Abdomen, Front perineal area, Buttocks, Right upper leg, Back    Bathing assist Assist Level: Touching or steadying assistance(Pt > 75%)      Upper Body Dressing/Undressing Upper body dressing   What is the patient wearing?: Bra, Pull over shirt/dress Bra - Perfomed by patient: Thread/unthread right bra strap, Thread/unthread left bra strap, Hook/unhook bra (pull down sports bra)   Pull over shirt/dress - Perfomed by patient: Thread/unthread right sleeve, Thread/unthread left sleeve, Put head through opening, Pull shirt over trunk   Button up shirt - Perfomed by patient: Thread/unthread right sleeve, Thread/unthread left sleeve, Pull shirt around back, Button/unbutton shirt      Upper body assist Assist Level: Set up   Set up : To obtain clothing/put away  Lower Body Dressing/Undressing Lower body dressing   What is the patient wearing?: Pants     Pants- Performed by patient: Thread/unthread right pants leg, Thread/unthread left pants leg, Pull pants up/down   Non-skid slipper socks- Performed by patient: Don/doff right sock Non-skid slipper socks- Performed by helper: Don/doff left sock                  Lower body assist Assist for lower body dressing: Set up, Assistive  device Assistive Device Comment: reacher Set up : To obtain clothing/put away  Toileting Toileting   Toileting steps completed by patient: Performs perineal hygiene Toileting steps completed by helper: Adjust clothing after toileting    Toileting assist Assist level: Touching or steadying assistance (Pt.75%)   Transfers Chair/bed transfer   Chair/bed  transfer method: Ambulatory Chair/bed transfer assist level: Supervision or verbal cues Chair/bed transfer assistive device: Armrests, Patent attorney Ambulation activity did not occur: Safety/medical concerns   Max distance: 82ft Assist level: Touching or steadying assistance (Pt > 75%)   Wheelchair   Type: Manual Max wheelchair distance: 200 Assist Level: Supervision or verbal cues  Cognition Comprehension Comprehension assist level: Follows complex conversation/direction with no assist  Expression Expression assist level: Expresses complex ideas: With no assist  Social Interaction Social Interaction assist level: Interacts appropriately with others - No medications needed.  Problem Solving Problem solving assist level: Solves complex problems: Recognizes & self-corrects  Memory Memory assist level: Complete Independence: No helper   Medical Problem List and Plan:  1. Decreased functional mobility secondary to left tibia-fibula fracture after fall status post ORIF 03/12/2018. Non-weightbearing with hinged knee brace. Left great toe weakness and numbness ?deep peroneal nerve injury  Continue CIR 2. DVT Prophylaxis/Anticoagulation: Subcutaneous heparin. Venous Doppler studies negative  3. Pain Management: Oxycodone as needed, right wrist sprain may ice 4. Mood: Provide emotional support  5. Neuropsych: This patient is capable of making decisions on her own behalf.  6. Skin/Wound Care: Routine skin checks    Cellulitis LLE with improvement, empiric keflex DC'd on 6/24  Discussed appropriate dressing with Ace wraps with nursing 7. Fluids/Electrolytes/Nutrition: Routine in and outs 8. Acute on chronic anemia. Continue iron supplement   Hb 10.0 on 6/18  Labs pending  Continue to monitor 9. ID/CMV infection. Continue Valganciclovir per infectious disease as well as Bactrim x21 days. Follow-up transplant nephrologist as outpatient  10. Diabetes mellitus. Patient with  insulin pump. Diabetic coordinator follow-up. Hemoglobin A1c 5.8.   Pt self managing pump per Endo parameters  Hypoglycemia overnight 11. CKD stage III with history of kidney transplant 2017.   Continue Prograf and chronic prednisone   Creatinine 1.23 6/24 Filed Weights   03/28/18 0500 03/29/18 0448 03/30/18 0500  Weight: 98.1 kg (216 lb 4.3 oz) 93 kg (205 lb 0.4 oz) 90.8 kg (200 lb 2.8 oz)   ? Reliability  80mg  lasix daily at home, resumed 20mg  daily 6/20   Daily labs 12. Hypertension. Norvasc 10 mg daily, Cardura 4 mg daily, hydralazine 100 mg every 8 hours, imdur 60 mg twice daily, Lopressor 50 mg twice daily.   Vitals:   03/29/18 2003 03/30/18 0608  BP: (!) 160/59 (!) 134/57  Pulse: 70 63  Resp:  17  Temp: 98.3 F (36.8 C) 98 F (36.7 C)  SpO2: 97% 96%   Labile on 6/25 13. Hyperlipidemia. Pravachol  14. Constipation.  -continue prophylactic regimen 15.  Right wrist pain ulnar styloid tenderness -improved  Xray with? Abnormality, will consider MRI of pain persists  Continue wrist splint for sprain   LOS (Days) 8 A FACE TO FACE EVALUATION WAS PERFORMED  Ankit Karis Juba, MD 03/30/2018 8:19 AM

## 2018-03-31 ENCOUNTER — Inpatient Hospital Stay (HOSPITAL_COMMUNITY): Payer: Medicare Other | Admitting: Physical Therapy

## 2018-03-31 ENCOUNTER — Inpatient Hospital Stay (HOSPITAL_COMMUNITY): Payer: Medicare Other | Admitting: Occupational Therapy

## 2018-03-31 DIAGNOSIS — B259 Cytomegaloviral disease, unspecified: Secondary | ICD-10-CM

## 2018-03-31 LAB — RENAL FUNCTION PANEL
ANION GAP: 9 (ref 5–15)
Albumin: 2.8 g/dL — ABNORMAL LOW (ref 3.5–5.0)
BUN: 20 mg/dL (ref 8–23)
CALCIUM: 8.7 mg/dL — AB (ref 8.9–10.3)
CHLORIDE: 110 mmol/L (ref 98–111)
CO2: 21 mmol/L — AB (ref 22–32)
CREATININE: 1.36 mg/dL — AB (ref 0.44–1.00)
GFR calc Af Amer: 47 mL/min — ABNORMAL LOW (ref >60)
GFR calc non Af Amer: 41 mL/min — ABNORMAL LOW (ref >60)
GLUCOSE: 78 mg/dL (ref 70–99)
Phosphorus: 3.4 mg/dL (ref 2.5–4.6)
Potassium: 3.5 mmol/L (ref 3.5–5.1)
Sodium: 140 mmol/L (ref 135–145)

## 2018-03-31 LAB — GLUCOSE, CAPILLARY
GLUCOSE-CAPILLARY: 72 mg/dL (ref 70–99)
GLUCOSE-CAPILLARY: 82 mg/dL (ref 70–99)
Glucose-Capillary: 88 mg/dL (ref 70–99)
Glucose-Capillary: 48 mg/dL — ABNORMAL LOW (ref 70–99)
Glucose-Capillary: 102 mg/dL — ABNORMAL HIGH (ref 70–99)

## 2018-03-31 MED ORDER — ENOXAPARIN SODIUM 40 MG/0.4ML ~~LOC~~ SOLN
40.0000 mg | SUBCUTANEOUS | Status: DC
  Filled 2018-03-31 (×2): qty 0.4

## 2018-03-31 NOTE — Progress Notes (Signed)
Occupational Therapy Session Note  Patient Details  Name: Hannah GellDonna Valentine MRN: [id number] Date of Birth: 05/18/1956  Today's Date: 03/31/2018 OT Individual Time: [id number] OT Individual Time Calculation (min): 55 min    Short Term Goals: Week 1:  OT Short Term Goal 1 (Week 1): STG = LTGs due to ELOS  Skilled Therapeutic Interventions/Progress Updates:    Pt seen for OT session focusing on ADL re-training and education. Pt awake in supine upon arrival, voicing chronic pain, however, declining need for intervention and agreeable to tx session. Therapist provided ACE wrap to LLE, education and demonstration provided for figure 8 wrapping technique for pt to be able to direct caregiver for wrapping technique at d/c. Pt declined need for handout on figure eight wrapping technique.  She declined need for bathing task, opting to just dress at bed level. Pants donned with use of reacher to assist with threading. She transferred to EOB using hospital bed functions and stood from elevated EOB with supervision to pull pants up and transitioned to sitting in w/c.  Discussion and demonstration provided at length regarding home bathroom set-up and feasibility of showering at d/c. Therapist demonstrated technique for RW management to back into shower stall. Pt refused this method as she voiced need feeling comfortable with backward transfer and refusing to attempt. Pt stated she would be able to fit RW into shower stall and wanted to go forward into shower with RW. Pt attempted in simulated shower stall transfer, however, realized it was too far in order to use RW effectively and therefore not able to complete transfer. She cont to refuse backward transfer method and stated she would just not shower at d/c. Despite encouragement and education, pt cont to refuse.  Pt self propelled w/c back to room at end of session, left seated with all needs in reach.  Pt desiring to shower while on unit before planned d/c home on  Friday, will alert primary therapist.   Therapy Documentation Precautions:  Precautions Precautions: Fall Required Braces or Orthoses: Other Brace/Splint Other Brace/Splint: hinged brace LLE (locked at 0), ortho PA reports brace can be unlocked when up and locked at night for sleeping.   Restrictions Weight Bearing Restrictions: Yes LLE Weight Bearing: Non weight bearing  See Function Navigator for Current Functional Status.   Therapy/Group: Individual Therapy  Trishelle Devora L 03/31/2018, 6:47 AM

## 2018-03-31 NOTE — Progress Notes (Signed)
Inpatient Diabetes Program Recommendations  AACE/ADA: New Consensus Statement on Inpatient Glycemic Control (2015)  Target Ranges:  Prepandial:   less than 140 mg/dL      Peak postprandial:   less than 180 mg/dL (1-2 hours)      Critically ill patients:  140 - 180 mg/dL   Lab Results  Component Value Date   GLUCAP 48 (L) 03/31/2018   HGBA1C 5.8 (H) 03/11/2018    Review of Glycemic ControlResults for Hannah Valentine, Hannah Valentine (MRN [id number]) as of 03/31/2018 12:57  Ref. Range 03/30/2018 12:04 03/30/2018 16:37 03/30/2018 22:21 03/31/2018 06:56 03/31/2018 11:43  Glucose-Capillary Latest Ref Range: 70 - 99 mg/dL 92 73 88 72 48 (L)   HomeDiabetes medications: Insulin pump: 630 G Basal Rate: Current Adjustment  00:00 0.825 02:00 am 0.65 0.5  05:00 am 0.575 06:00 am 0.7 08:30 am 1.35 02:00 pm 1.4 05:00 pm1.10 09:00 pm1.05  Carb Ratio:  00:00 16  12:00 pm 17   Insulin Sensitivity:  00:00 50  10:00 am 50 60  06:00 pm 50   BG Target:  00:00 120  Inpatient Diabetes Program Recommendations:   Patient on insulin pump.  Note that she has had 2 mild lows in the past 24 hours.  Per RN, patient has glucose tablets that she treats her hypoglycemia with.  Spoke with RN and recommended that she contact PA.  Patient may need reduction in basal rates however will need MD order for this.  RN verbalized understanding.   Thanks,  Beryl MeagerJenny Dustine Bertini, RN, BC-ADM Inpatient Diabetes Coordinator Pager [phone] (8a-5p)

## 2018-03-31 NOTE — Progress Notes (Signed)
Physical Therapy Session Note  Patient Details  Name: Hannah Valentine MRN: [id number] Date of Birth: August 23, 1956  Today's Date: 03/31/2018 PT Individual Time: 1015-1130 PT Individual Time Calculation (min): 75 min   Short Term Goals: Week 1:  PT Short Term Goal 1 (Week 1): STG=LTG  Skilled Therapeutic Interventions/Progress Updates:   Pt received sitting in WC and agreeable to PT.   PT instructed pt in WC mobility x 158f with distant supervision assist from PT. Min cues for safety through door ways and decreased turning radius.   PT isntructed pt in sittng and standing therex. Seated knee extension/flexion to end range with 3 sec hold x 10 BLE with 7lb ankle weight on the RLE. Ankle DF with prolonged stretch by PT 4 x 45sed hold. AAROM with 10 sec hold.  Standing 3 way hip AROM flexion/extension/abduciton x 10 LLE only and UE support on PFRW.   Gait training with PMattoonand supervision assist from PT 2x 250fwith min cues for safety.  Stair management to ascend 1 step x 2 (3") to simulate access to walk in shower with PFRW and mod assist. Moderate cues from PT for set up and AD management. Pt educated in potential use of shower bench to access shower at home due to fear of stepping into shower and pt report of wide door shower.   Patient returned to room and performed ambulatory transfer to [facility] PFRichwoodnd supervision assist. Pt required verbal and visual cues for [facility] management prior to transfer. Following transfer, pt left sitting in [facility] bell in reach and all needs met.         Therapy Documentation Precautions:  Precautions Precautions: Fall Required Braces or Orthoses: Other Brace/Splint Other Brace/Splint: hinged brace LLE (locked at 0), ortho PA reports brace can be unlocked when up and locked at night for sleeping.   Restrictions Weight Bearing Restrictions: Yes LLE Weight Bearing: Non weight bearing Pain:    Denies   See Function Navigator for Current  Functional Status.   Therapy/Group: Individual Therapy  AuLorie Phenix/26/2019, 6:01 PM

## 2018-03-31 NOTE — Progress Notes (Addendum)
CBG this am 72 at 0656, ate breakfast 17g carbs she reports and did not give self any extra insulin for carbs.  At 1143 pt CBG 48, pt self administered her own glucose tabs. She rechecked her glucose at 1200, 71 on her meter. Pt agreed to eat few graham crackers while waiting for her husband to bring her lunch.   Pt states she has not adjusted her basil rate. Pt has been participating in therapy.    Boneta LucksJenny, diabetes coordinator called to speak with RN about pt's blood sugars, asked if pt had made any changes in basil rate. (pt was asked by RN earlier after am and noon sugars if she needed to adjust her insulin rate due to low glucose levels, more exercise with therapies and diet effecting her overall insulin needs.)  Pt has not decided to make changes at this time.   Will contact PA, Dan and inform him of the above.

## 2018-03-31 NOTE — Progress Notes (Signed)
Social Work Patient ID: Hannah Valentine, female   DOB: 11/27/1955, 62 y.o.   MRN: [id number]   Have reviewed team conf with pt who is agreeable with d/c date changed to 6/28 with focus on increasing strength to be able to negotiate stairs in the home.  Met with pt yesterday to review her concerns and feelings that she was being d/c'd too early.  Discussed initial team goals being set for pt to stay on 1st level of home and that pt and spouse then changed their personal goals to be able to go upstairs.  PT has been working through this with pt on how this can be done as safely as possible.  Pt insistent that she and spouse can manage stairs and recognize that, if they are unable to go up/down safely, then she will need to stay on first level.   Have referred for HHPT (and OT) to work on this in the home as well.  No further concerns.  Manpreet Strey, LCSW

## 2018-03-31 NOTE — Patient Care Conference (Signed)
Inpatient RehabilitationTeam Conference and Plan of Care Update Date: 03/31/2018   Time: 11:35 AM    Patient Name: Hannah Valentine      Medical Record Number: [id number]  Date of Birth: Sep 29, 1956 Sex: Female         Room/Bed: 4M03C/4M03C-01 Payor Info: Payor: MEDICARE / Plan: MEDICARE PART A AND B / Product Type: *No Product type* /    Admitting Diagnosis: Tib Fib FX Rebtak Transplant  Admit Date/Time:  03/22/2018  5:27 PM Admission Comments: No comment available   Primary Diagnosis:  <principal problem not specified> Principal Problem: <principal problem not specified>  Patient Active Problem List   Diagnosis Date Noted  . Hypertension   . Hypokalemia   . Cytomegalovirus infection (HCC)   . Diabetes mellitus type 2 in obese (HCC)   . Wrist pain, acute, right   . Stage 3 chronic kidney disease (HCC)   . Anemia of chronic disease   . CMV infection, acute (HCC) 03/22/2018  . Tibia/fibula fracture 03/22/2018  . Pressure injury of skin 03/20/2018  . Fever   . Trauma   . FUO (fever of unknown origin)   . Hypertensive crisis   . Prediabetes   . Acute blood loss anemia   . Closed fracture of upper end of left fibula   . Left tibial fracture 03/11/2018  . Hypoglycemia 10/04/2017  . Fall 10/04/2017  . SAH (subarachnoid hemorrhage) (HCC) 10/04/2017  . DM (diabetes mellitus), type 1 (HCC) 10/04/2017  . Essential hypertension   . HLD (hyperlipidemia)   . CKD (chronic kidney disease), stage III (HCC)   . Closed fracture of left orbital floor (HCC)   . Closed fracture of maxillary sinus (HCC)   . Syncope   . History of kidney transplant 10/07/2015    Expected Discharge Date: Expected Discharge Date: 04/02/18  Team Members Present: Physician leading conference: Dr. Maryla Morrow Social Worker Present: Amada Jupiter, LCSW Nurse Present: Kennon Portela, RN PT Present: Harless Litten, PTA;Grier Rocher, PT OT Present: Roney Mans, OT SLP Present: Jackalyn Lombard, SLP PPS Coordinator  present : Tora Duck, RN, CRRN     Current Status/Progress Goal Weekly Team Focus  Medical   Decreased functional mobility secondary to left tibia-fibula fracture after fall status post ORIF 03/12/2018. Non-weightbearing with hinged knee brace. Left great toe weakness and numbness ?deep peroneal nerve injury  Improve mobility, BP, infection  See above   Bowel/Bladder   continetn of bowel and bladder, LBM 03-28-18  remain continent of bowel and bladder, maintain regular bowel pattern   Assist with toileting needs prn   Swallow/Nutrition/ Hydration             ADL's   supervision/setup bathing and dressing, supervision stand pivot transfers, min guard transfers with ambulation  supervision overall  dynamic standing balance, bathroom transfers, activity tolerance, d/c planning   Mobility   supervision assist with WC mobility at household level, trasnfers, and gait for short distances.  CGA bed mobility to manage the LLE  Supervision assist overall with LRAD for trasnfers and gait pfor short distances. CGA bed mobility  improved safety with transfers and gait traning, problem solving for home access, endurance, and improved L knee/ankle ROM   Communication             Safety/Cognition/ Behavioral Observations            Pain   pain managed with prn tylenol  pain < or = 4  Assess pain q shift and prn  Skin   sutures left leg  no new skin issues  Assess skin q shift and prn     Rehab Goals Patient on target to meet rehab goals: Yes *See Care Plan and progress notes for long and short-term goals.     Barriers to Discharge  Current Status/Progress Possible Resolutions Date Resolved   Physician    Medical stability;Wound Care     See above  Therapies, optimize DM (self-managed insulin pump), optimize HTN, cont antifungals/biotics, follow labs      Nursing                  PT                    OT                  SLP                SW                Discharge  Planning/Teaching Needs:  Home with husband who can provide some assist but has back issues so is limited.  teaching completed   Team Discussion:  Medically stable;  Adjusting BP meds; may need further DM management/ follow up as OP.  Pt has been refusing some meds - MD aware.  Therapies addressing stair mobility per pt/spouse request now.  Have recommended extension x 1 day to work on this further.  Reaching supervision goals overall.  Revisions to Treatment Plan:  Slight extension to address stairs.    Continued Need for Acute Rehabilitation Level of Care: The patient requires daily medical management by a physician with specialized training in physical medicine and rehabilitation for the following conditions: Daily direction of a multidisciplinary physical rehabilitation program to ensure safe treatment while eliciting the highest outcome that is of practical value to the patient.: Yes Daily medical management of patient stability for increased activity during participation in an intensive rehabilitation regime.: Yes Daily analysis of laboratory values and/or radiology reports with any subsequent need for medication adjustment of medical intervention for : Wound care problems;Blood pressure problems;Diabetes problems;Other  Tacha Manni 04/02/2018, 10:17 AM

## 2018-03-31 NOTE — Progress Notes (Signed)
[facility] PHYSICAL MEDICINE & REHABILITATION     PROGRESS NOTE   Subjective/Complaints: Patient seen sitting up in bed this morning. She states she slept well overnight. She has questions about her discharge medications and CMV level.  ROS: denies CP, SOB, nausea, vomiting, diarrhea.  Objective:  No results found. Recent Labs    03/30/18 0748  WBC 5.2  HGB 10.4*  HCT 32.8*  PLT 263   Recent Labs    03/30/18 0748 03/31/18 0550  NA 139 140  K 3.4* 3.5  CL 110 110  GLUCOSE 109* 78  BUN 21 20  CREATININE 1.30* 1.36*  CALCIUM 8.6* 8.7*   CBG (last 3)  Recent Labs    03/30/18 1637 03/30/18 2221 03/31/18 0656  GLUCAP 73 88 72    Wt Readings from Last 3 Encounters:  03/31/18 91.9 kg (202 lb 9.6 oz)  03/22/18 95 kg (209 lb 7 oz)  10/04/17 93.4 kg (205 lb 14.6 oz)     Intake/Output Summary (Last 24 hours) at 03/31/2018 0842 Last data filed at 03/30/2018 1751 Gross per 24 hour  Intake 480 ml  Output -  Net 480 ml    Vital Signs: Blood pressure (!) 176/58, pulse 60, temperature 98.8 F (37.1 C), temperature source Oral, resp. rate 17, height 5\' 7"  (1.702 m), weight 91.9 kg (202 lb 9.6 oz), SpO2 93 %. Physical Exam:  Constitutional: No distress . Vital signs reviewed. HENT: Normocephalic.  Atraumatic. Eyes: EOMI. No discharge. Cardiovascular: RRR. No JVD. Left upper extremity fistula Respiratory: CTA Bilaterally. Normal effort. GI: BS +. Non-distended. Musc: edema in left foot (stable). Neurological: Alert and oriented Motor: bilateral upper extremities: 5/5 proximal distal, except for right wrist which is braced Right lower extremity: 4+/5 proximal to distal Left lower extremity: Hip flexion 1/5, wiggles toes (some pain inhibition) Skin: scattered hematomas left foot with sutures C/D/I Psychiatric: normal mood, normal behavior.  Assessment/Plan: 1. Functional deficits secondary to left tib-fib fx which require 3+ hours per day of interdisciplinary therapy  in a comprehensive inpatient rehab setting. Physiatrist is providing close team supervision and 24 hour management of active medical problems listed below. Physiatrist and rehab team continue to assess barriers to discharge/monitor patient progress toward functional and medical goals.  Function:  Bathing Bathing position   Position: (seated in recliner)  Bathing parts Body parts bathed by patient: Right arm, Left arm, Chest, Abdomen, Front perineal area, Buttocks, Right upper leg, Back    Bathing assist Assist Level: Touching or steadying assistance(Pt > 75%)      Upper Body Dressing/Undressing Upper body dressing   What is the patient wearing?: Bra, Pull over shirt/dress Bra - Perfomed by patient: Thread/unthread right bra strap, Thread/unthread left bra strap, Hook/unhook bra (pull down sports bra)   Pull over shirt/dress - Perfomed by patient: Thread/unthread right sleeve, Thread/unthread left sleeve, Put head through opening, Pull shirt over trunk   Button up shirt - Perfomed by patient: Thread/unthread right sleeve, Thread/unthread left sleeve, Pull shirt around back, Button/unbutton shirt      Upper body assist Assist Level: Set up   Set up : To obtain clothing/put away  Lower Body Dressing/Undressing Lower body dressing   What is the patient wearing?: Pants     Pants- Performed by patient: Thread/unthread right pants leg, Thread/unthread left pants leg, Pull pants up/down   Non-skid slipper socks- Performed by patient: Don/doff right sock Non-skid slipper socks- Performed by helper: Don/doff left sock  Lower body assist Assist for lower body dressing: Set up, Assistive device Assistive Device Comment: reacher Set up : To obtain clothing/put away  Toileting Toileting   Toileting steps completed by patient: Adjust clothing prior to toileting, Performs perineal hygiene, Adjust clothing after toileting Toileting steps completed by helper: Adjust  clothing after toileting    Toileting assist Assist level: Supervision or verbal cues   Transfers Chair/bed transfer   Chair/bed transfer method: Ambulatory Chair/bed transfer assist level: Supervision or verbal cues Chair/bed transfer assistive device: Armrests, Walker     Locomotion Ambulation Ambulation activity did not occur: Safety/medical concerns   Max distance: 7210ft Assist level: Touching or steadying assistance (Pt > 75%)   Wheelchair   Type: Manual Max wheelchair distance: 200 Assist Level: Supervision or verbal cues  Cognition Comprehension Comprehension assist level: Follows complex conversation/direction with no assist  Expression Expression assist level: Expresses complex ideas: With no assist  Social Interaction Social Interaction assist level: Interacts appropriately with others - No medications needed.  Problem Solving Problem solving assist level: Solves complex problems: Recognizes & self-corrects  Memory Memory assist level: Complete Independence: No helper   Medical Problem List and Plan:  1. Decreased functional mobility secondary to left tibia-fibula fracture after fall status post ORIF 03/12/2018. Non-weightbearing with hinged knee brace. Left great toe weakness and numbness ?deep peroneal nerve injury  Continue CIR  D/c sutures 2. DVT Prophylaxis/Anticoagulation: Subcutaneous heparin. Venous Doppler studies negative  3. Pain Management: Oxycodone as needed, right wrist sprain may ice 4. Mood: Provide emotional support  5. Neuropsych: This patient is capable of making decisions on her own behalf.  6. Skin/Wound Care: Routine skin checks    Cellulitis LLE with improvement, empiric keflex DC'd on 6/24  Discussed appropriate dressing with Ace wraps with nursing 7. Fluids/Electrolytes/Nutrition: Routine in and outs 8. Acute on chronic anemia. Continue iron supplement   Hb 10.4 on 6/25  Continue to monitor 9. ID/CMV infection. Continue Valganciclovir per  infectious disease as well as Bactrim x21 days (~7/2). Follow-up transplant nephrologist as outpatient  10. Diabetes mellitus. Patient with insulin pump. Diabetic coordinator follow-up. Hemoglobin A1c 5.8.   Pt self managing pump per Endo parameters  Relatively controlled on 6/26 11. CKD stage III with history of kidney transplant 2017.   Continue Prograf and chronic prednisone   Creatinine 1.36 on 6/26 Filed Weights   03/29/18 0448 03/30/18 0500 03/31/18 0500  Weight: 93 kg (205 lb 0.4 oz) 90.8 kg (200 lb 2.8 oz) 91.9 kg (202 lb 9.6 oz)   ? Reliability  80mg  lasix daily at home, resumed 20mg  daily 6/20   Daily labs 12. Hypertension. Norvasc 10 mg daily, Cardura 4 mg daily, hydralazine 100 mg every 8 hours, imdur 60 mg twice daily, Lopressor 50 mg twice daily.   Vitals:   03/30/18 1959 03/31/18 0622  BP: (!) 170/59 (!) 176/58  Pulse: 69 60  Resp: 17 17  Temp: 98.4 F (36.9 C) 98.8 F (37.1 C)  SpO2: 98% 93%   Relatively controlled, however elevated over last 12 hours. Continue to monitor for trend 13. Hyperlipidemia. Pravachol  14. Constipation.  -continue prophylactic regimen 15.  Right wrist pain ulnar styloid tenderness -improved  Xray with? Abnormality, will consider MRI of pain persists  Continue wrist splint for sprain   LOS (Days) 9 A FACE TO FACE EVALUATION WAS PERFORMED  Ankit Karis JubaAnil Patel, MD 03/31/2018 8:42 AM

## 2018-03-31 NOTE — Progress Notes (Signed)
Physical Therapy Session Note  Patient Details  Name: Hannah Valentine MRN: [id number] Date of Birth: 01-30-1956  Today's Date: 03/31/2018 PT Individual Time: 1430-1530 PT Individual Time Calculation (min): 60 min   Short Term Goals: Week 1:  PT Short Term Goal 1 (Week 1): STG=LTG  Skilled Therapeutic Interventions/Progress Updates: Pt presented in recliner agreeable to therapy. Session focused on functional transfers within home set up. Pt propelled to ortho gym and set up bed to replicate home bed. Pt able to sit at EOB and given option of using UE to scoot backwards into bed or use of small footstool to place RLE for leverage. Pt adv at home has a dog platform to bed (x 2-3 small steps) that pt can use as well. Pt transitioned to long sit mod I with increased time and reviewed ankle ROM DF/inversion/eversion with use of gait belt, knee extension, and AA heel slides with use of gait belt. Pt demonstrated good understanding of all exercises. Pt performed stand pivot to return to w/c at supervision level and propelled back to room mod I. Pt returned to recliner in same manner as prior and remained in recliner at end of session with needs met.      Therapy Documentation Precautions:  Precautions Precautions: Fall Required Braces or Orthoses: Other Brace/Splint Other Brace/Splint: hinged brace LLE (locked at 0), ortho PA reports brace can be unlocked when up and locked at night for sleeping.   Restrictions Weight Bearing Restrictions: Yes LLE Weight Bearing: Non weight bearing General:   Vital Signs: Therapy Vitals Temp: 98.4 F (36.9 C) Temp Source: Oral Pulse Rate: (!) 59 Resp: 19 BP: (!) 117/46 Patient Position (if appropriate): Sitting Oxygen Therapy SpO2: 100 % O2 Device: Room Air  See Function Navigator for Current Functional Status.   Therapy/Group: Individual Therapy  Jennier Schissler  Avaneesh Pepitone, PTA  03/31/2018, 4:15 PM

## 2018-04-01 ENCOUNTER — Inpatient Hospital Stay (HOSPITAL_COMMUNITY): Payer: Medicare Other | Admitting: Occupational Therapy

## 2018-04-01 ENCOUNTER — Inpatient Hospital Stay (HOSPITAL_COMMUNITY): Payer: Medicare Other | Admitting: Physical Therapy

## 2018-04-01 DIAGNOSIS — E876 Hypokalemia: Secondary | ICD-10-CM

## 2018-04-01 DIAGNOSIS — I10 Essential (primary) hypertension: Secondary | ICD-10-CM

## 2018-04-01 LAB — GLUCOSE, CAPILLARY
Glucose-Capillary: 182 mg/dL — ABNORMAL HIGH (ref 70–99)
Glucose-Capillary: 123 mg/dL — ABNORMAL HIGH (ref 70–99)
Glucose-Capillary: 65 mg/dL — ABNORMAL LOW (ref 70–99)
Glucose-Capillary: 76 mg/dL (ref 70–99)
Glucose-Capillary: 100 mg/dL — ABNORMAL HIGH (ref 70–99)

## 2018-04-01 MED ORDER — HYDRALAZINE HCL 100 MG PO TABS: 100.0000 mg | ORAL_TABLET | Freq: Three times a day (TID) | ORAL | 3 refills | Status: AC

## 2018-04-01 MED ORDER — VALGANCICLOVIR HCL 450 MG PO TABS: 450.0000 mg | ORAL_TABLET | Freq: Two times a day (BID) | ORAL | 0 refills | Status: AC

## 2018-04-01 MED ORDER — DOXAZOSIN MESYLATE 4 MG PO TABS: 4.0000 mg | ORAL_TABLET | Freq: Every evening | ORAL | 3 refills | Status: AC

## 2018-04-01 MED ORDER — FERROUS SULFATE 325 (65 FE) MG PO TBEC: 325.0000 mg | DELAYED_RELEASE_TABLET | Freq: Every day | ORAL | 3 refills | Status: AC

## 2018-04-01 MED ORDER — METOPROLOL TARTRATE 25 MG PO TABS: 50.0000 mg | ORAL_TABLET | Freq: Two times a day (BID) | ORAL | 0 refills | Status: AC

## 2018-04-01 MED ORDER — POTASSIUM CHLORIDE 20 MEQ PO PACK
20.0000 meq | PACK | Freq: Every day | ORAL | Status: DC
  Administered 2018-04-01 – 2018-04-02 (×2): 20 meq via ORAL
  Filled 2018-04-01 (×2): qty 1

## 2018-04-01 MED ORDER — OXYCODONE HCL 10 MG PO TABS: 10.0000 mg | ORAL_TABLET | ORAL | 0 refills | Status: AC | PRN

## 2018-04-01 MED ORDER — AMLODIPINE BESYLATE 10 MG PO TABS: 10.0000 mg | ORAL_TABLET | Freq: Every day | ORAL | 1 refills | Status: AC

## 2018-04-01 MED ORDER — SULFAMETHOXAZOLE-TRIMETHOPRIM 400-80 MG PO TABS: ORAL_TABLET | ORAL | 2 refills | Status: AC

## 2018-04-01 MED ORDER — POTASSIUM CHLORIDE 20 MEQ PO PACK: 20.0000 meq | PACK | Freq: Every day | ORAL | 1 refills | Status: AC

## 2018-04-01 MED ORDER — PRAVASTATIN SODIUM 40 MG PO TABS: 40.0000 mg | ORAL_TABLET | Freq: Every day | ORAL | 3 refills | Status: AC

## 2018-04-01 MED ORDER — ISOSORBIDE MONONITRATE ER 60 MG PO TB24: 60.0000 mg | ORAL_TABLET | Freq: Two times a day (BID) | ORAL | 0 refills | Status: AC

## 2018-04-01 MED ORDER — PREDNISONE 5 MG PO TABS: 5.0000 mg | ORAL_TABLET | Freq: Every day | ORAL | 0 refills | Status: AC

## 2018-04-01 MED ORDER — FUROSEMIDE 40 MG PO TABS: 40.0000 mg | ORAL_TABLET | Freq: Every day | ORAL | 1 refills | Status: AC

## 2018-04-01 MED ORDER — MYCOPHENOLATE SODIUM 180 MG PO TBEC: 180.0000 mg | DELAYED_RELEASE_TABLET | Freq: Two times a day (BID) | ORAL | 1 refills | Status: AC

## 2018-04-01 MED ORDER — POLYETHYLENE GLYCOL 3350 17 G PO PACK: 17.0000 g | PACK | Freq: Every day | ORAL | 0 refills | Status: AC

## 2018-04-01 MED ORDER — FUROSEMIDE 40 MG PO TABS
40.0000 mg | ORAL_TABLET | Freq: Every day | ORAL | Status: DC
  Administered 2018-04-02: 40 mg via ORAL
  Filled 2018-04-01: qty 1

## 2018-04-01 NOTE — Discharge Summary (Signed)
NAMEARCOLA, FRESHOUR MEDICAL RECORD JW:[id number] ACCOUNT [id number] DATE OF BIRTH:01/22/1956 FACILITY: [facility] LOCATION: [facility] PHYSICIAN:ANKIT PATEL, MD  DISCHARGE SUMMARY  DATE OF DISCHARGE:  04/02/2018  DATE OF ADMISSION:  03/23/2018  DISCHARGE:  04/02/2018   DISCHARGE DIAGNOSES: 1.  Left tibia fibula fracture after a fall with open reduction internal fixation in 03/12/2018. 2.  Subcutaneous heparin for deep venous thrombosis prophylaxis. 3.  Pain management. 4.  Acute on chronic anemia. 5.  CMV infection. 6.  Diabetes mellitus. 7.  Chronic kidney disease, stage III with history of kidney transplant. 8.  Hypertension. 9.  Hyperlipidemia.  HISTORY OF PRESENT ILLNESS:  This is a 62 year old right-handed female with history of hypertension, diabetes mellitus.  She has an insulin pump.  Lives with spouse, independent prior to admission.  Admitted 03/11/2018 after a fall with subsequent left  tibia-fibula fracture.  There was no loss of consciousness.  Evaluated by Dr. [name] with orthopedic services, underwent ORIF left proximal tibia 03/12/2018 nonweightbearing with hinged knee brace.  HOSPITAL COURSE:  Medication management, acute blood loss anemia, acute on chronic renal failure, poorly controlled blood sugars, signs of early DKA 03/17/2018.  Her Lasix initially placed on hold.  Treated with gentle IV fluids.  Continued improvement  followed by renal services.  Dr. Luciana Axe of infectious disease consulted for input in regards to fever workup.  Urine revealed positive CMV.  Started on Valganciclovir 03/16/2018 x21 days as well as Bactrim.  Blood culture showed no growth.  Lower  extremity Doppler is negative.  Maintained on subcutaneous heparin for DVT prophylaxis.  Wound care nurse followup for partial thickness non-pressure area left posterior thigh with foam dressings as indicated.  The patient was admitted for comprehensive  rehabilitation program.  PAST MEDICAL HISTORY:  See  discharge diagnoses.  SOCIAL HISTORY:  Lives with spouse, independent prior to admission.  FUNCTIONAL STATUS:  Upon admission to rehab services was minimal assist, lateral scoot transfers, minimal assist supine to sit, mod/max assist with activities of daily living.  PHYSICAL EXAMINATION: VITAL SIGNS:  Blood pressure 155/55, pulse 63, temperature 98, respirations 20. GENERAL:  Alert female, somewhat anxious, no acute distress.  EOMs intact. NECK:  Supple, nontender.  No JVD. CARDIOVASCULAR:  Rate controlled. ABDOMEN:  Soft, nontender, good bowel sounds. LUNGS:  Clear to auscultation without wheeze.  Lower extremity wounds dressed.  No odor.  REHABILITATION HOSPITAL COURSE:  The patient was admitted to inpatient rehabilitation services.  Therapies initiated on a 3-hour daily basis, consisting of physical therapy, occupational therapy and rehabilitation nursing.  The following issues were  addressed during patient's rehabilitation stay:  Pertaining to the patient's left tibia fibular fracture, ORIF 03/12/2018 nonweightbearing hinged knee brace.  She would follow up with orthopedic services.  Subcutaneous heparin for DVT prophylaxis  complete protocol.  Venous Doppler studies negative.  No bleeding episodes.  Acute on chronic anemia, stable 10.4.  CMV infection.  Follow up infectious disease.  She will complete a 21-day course of Valcyte and Bactrim 04/06/2018.  CKD stage III.   Continued on Prograf as well as prednisone.  History of kidney transplant.  Latest creatinine 1.36.  Blood pressure controlled on present regimen.  Bouts of constipation resolved with laxative assistance.  The patient will receive weekly collaborative  interdisciplinary team conferences to discuss estimated length of stay, family teaching, any barriers to discharge.  In regards to her diabetes mellitus, she continues with insulin pump, diabetic coordinator followup.  Follow up endocrinology.   Wheelchair mobility with distant  supervision.  Ambulates with a platform rolling walker supervision 25 feet, stair management platform rolling walker moderate assist, needing some assistance for lower body dressing.  DISCHARGE MEDICATIONS:  Included Norvasc 10 mg p.o. daily, Cardura 4 mg daily, ferrous sulfate 325 mg daily, Lasix 40 mg daily, hydralazine 100 mg every 8 hours, continue insulin pump as directed, Imdur 60 mg p.o. b.i.d., Lopressor 50 mg p.o. b.i.d.,  Myfortic 180 mg p.o. b.i.d., MiraLax daily, Pravachol 40 mg p.o. at bedtime, prednisone 5 mg p.o. daily, Bactrim 1 tablet Monday, Wednesday, Friday through 04/06/2018, Prograf 2 mg p.o. b.i.d., Valcyte 450 mg p.o. b.i.d. through 04/06/2018, resume baby aspirin as prior to admission, oxycodone  10-20 mg every 4 hours as needed for pain.  DIET:  Regular.  FOLLOWUP:  The patient would follow up with Dr. Maryla MorrowAnkit Patel at the outpatient rehabilitation service office as directed; Dr. Staci Righterobert Comer infectious disease, call for appointment; Dr. Caryn BeeKevin Haddix; Dr. Daphane ShepherdLori Beane medical management.  SPECIAL INSTRUCTIONS:  Nonweightbearing left lower extremity with a hinged knee brace.  TN/NUANCE D:04/01/2018 T:04/01/2018 JOB:[id number]

## 2018-04-01 NOTE — Progress Notes (Signed)
Occupational Therapy Session Note  Patient Details  Name: Hannah GellDonna Valentine MRN: [id number] Date of Birth: 05/23/1956  Today's Date: 04/01/2018 OT Individual Time: [id number] OT Individual Time Calculation (min): 88 min    Short Term Goals: Week 1:  OT Short Term Goal 1 (Week 1): STG = LTGs due to ELOS  Skilled Therapeutic Interventions/Progress Updates:    Pt seen for OT ADL bathing/dressing session. Pt in supine upon arrival, agreeable to tx session. She denied any pain out of the ordinary surgical pain. Pt excited for planned shower today. She transferred to EOB mod I using hospital bed functions. She ambulated throughout session using RW with supervision and increased time. Pt able to manage RW over thresholds in bathroom and transfer onto tub bench. MD clearance given for showering without brace or wound covered. Brace removed upon sitting in shower and re-donned prior to exiting shower, LE remaining straight throughout bathing task. She ambulated out of bathroom and dressed seated in w/c. RN assessed skin prior to Therapist donning ACE wraps and brace. She dressed seated in w/c, using reacher to assist with threading. Dried hair from seated position, stood to complete oral care in order to address functional standing balance and endurance.   Pt left seated in w/c at end of session, all needs in reach.   Therapy Documentation Precautions:  Precautions Precautions: Fall Required Braces or Orthoses: Other Brace/Splint Other Brace/Splint: hinged brace LLE (locked at 0), ortho PA reports brace can be unlocked when up and locked at night for sleeping.   Restrictions Weight Bearing Restrictions: Yes LLE Weight Bearing: Non weight bearing  See Function Navigator for Current Functional Status.   Therapy/Group: Individual Therapy  Tywone Bembenek L 04/01/2018, 6:59 AM

## 2018-04-01 NOTE — Progress Notes (Signed)
Occupational Therapy Session Note  Patient Details  Name: Hannah GellDonna Valentine MRN: [id number] Date of Birth: 12/20/1955  Today's Date: 04/01/2018 OT Individual Time: 1400-1456 OT Individual Time Calculation (min): 56 min    Short Term Goals: Week 1:  OT Short Term Goal 1 (Week 1): STG = LTGs due to ELOS  Skilled Therapeutic Interventions/Progress Updates:    Treatment session focused on ADLs/self care training, transfer training, there activity, pt education, and dc planning. Upon entering room, pt and pts husband receiving DME for home setting. Therapist and pt discuss home equipment and review home set up for each item. Pt received BSC, shower transfer bench, fitted wheelchair, and platform rolling walker. At this time, pts husband left with supplies to take home. Therapist and pt continue to discuss home set up and safe techniques to maneuver in home and community environment. Pt completed toileting routine which included transfer from chair to stand with walker and walking into and out of bathroom with S. Pt completed all toileting tasks with S and completed hand hygiene at standing level at sink with S. Pt transferred to recliner chair to completed footwear donning/doffing training. Pt demonstrated ability to don/dofff R shoe and sock with S while sitting using figure four technique by avoiding pressure on L LE. Therapist verified pt's WB status and ensured pt is continuing to follow with safety precautions. Pt demonstrates good safety awareness of precautions at this time. No c/o pain. Pt left resting in room with call bell in reach for next therapy.   Therapy Documentation Precautions:  Precautions Precautions: Fall Required Braces or Orthoses: Other Brace/Splint Other Brace/Splint: hinged brace LLE (locked at 0), ortho PA reports brace can be unlocked when up and locked at night for sleeping.   Restrictions Weight Bearing Restrictions: Yes LLE Weight Bearing: Non weight bearing General:    Vital Signs: Therapy Vitals Temp: 98.5 F (36.9 C) Temp Source: Oral Pulse Rate: 69 Resp: 19 BP: (!) 130/53 Patient Position (if appropriate): Sitting Oxygen Therapy SpO2: 100 % O2 Device: Room Air Pain: Pain Assessment Pain Scale: 0-10    Praxis Praxis: Intact  See Function Navigator for Current Functional Status.   Therapy/Group: Individual Therapy  Verdie ShireMolly  Deacon Gadbois 04/01/2018, 3:20 PM

## 2018-04-01 NOTE — Discharge Summary (Signed)
Discharge summary job [id number]

## 2018-04-01 NOTE — Progress Notes (Signed)
Occupational Therapy Discharge Summary  Patient Details  Name: Hannah Valentine MRN: [id number] Date of Birth: 12/15/1955   Patient has met 7 of 7 long term goals due to improved activity tolerance, improved balance, postural control, ability to compensate for deficits and improved coordination.  Patient to discharge at overall Supervision level.  Patient's care partner is independent to provide the necessary physical assistance at discharge.  Pt declined need for formal family education, she stated her husband was willing and able to provide the needed supervision assist at d/c. She also declined need for hand out regarding figure 8 wrapping technique for L LE to be worn under brace.   Recommendation:  Patient will benefit from ongoing skilled OT services in home health setting to continue to advance functional skills in the area of BADL, iADL and Reduce care partner burden.  Equipment: BSC, w/c, RW with R platform  Reasons for discharge: treatment goals met and discharge from hospital  Patient/family agrees with progress made and goals achieved: Yes  OT Discharge Precautions/Restrictions  Precautions Precautions: Fall Required Braces or Orthoses: Other Brace/Splint Other Brace/Splint: hinged brace LLE (locked at 0), ortho PA reports brace can be unlocked when up and locked at night for sleeping.   Restrictions Weight Bearing Restrictions: Yes LLE Weight Bearing: Non weight bearing Vision Baseline Vision/History: (P) Wears glasses Wears Glasses: (P) Reading only Patient Visual Report: (P) No change from baseline Perception  Perception: Within Functional Limits Praxis Praxis: Intact Cognition Overall Cognitive Status: Within Functional Limits for tasks assessed Arousal/Alertness: Awake/alert Orientation Level: Oriented X4 Safety/Judgment: Appears intact Sensation Sensation Light Touch: Appears Intact Proprioception: Appears Intact Coordination Gross Motor Movements are Fluid  and Coordinated: No Fine Motor Movements are Fluid and Coordinated: Yes Coordination and Movement Description: Movement and coordination limited secondary to pain and L LE ROM deficits.  Motor  Motor Motor: Within Functional Limits Motor - Discharge Observations: Generalized weakness, limited by pain and ROM restrictions Trunk/Postural Assessment  Cervical Assessment Cervical Assessment: Within Functional Limits Thoracic Assessment Thoracic Assessment: Within Functional Limits Lumbar Assessment Lumbar Assessment: Within Functional Limits Postural Control Postural Control: Within Functional Limits  Balance Balance Balance Assessed: Yes Dynamic Sitting Balance Dynamic Sitting - Balance Support: During functional activity Dynamic Sitting - Level of Assistance: 7: Independent Static Standing Balance Static Standing - Balance Support: During functional activity Static Standing - Level of Assistance: 5: Stand by assistance Dynamic Standing Balance Dynamic Standing - Balance Support: During functional activity;Right upper extremity supported;Left upper extremity supported Dynamic Standing - Level of Assistance: 5: Stand by assistance Extremity/Trunk Assessment RUE Assessment RUE Assessment: Within Functional Limits LUE Assessment LUE Assessment: Within Functional Limits   See Function Navigator for Current Functional Status.  Kelise Kuch L 04/01/2018, 10:40 AM

## 2018-04-01 NOTE — Progress Notes (Signed)
[facility] PHYSICAL [facility]ICINE & REHABILITATION     PROGRESS NOTE   Subjective/Complaints: Patient seen sitting up in bed this morning. She states she slept well overnight. She states she is looking forward to discharge tomorrow.  ROS: denies CP, SOB, nausea, vomiting, diarrhea.  Objective:  No results found. Recent Labs    03/30/18 0748  WBC 5.2  HGB 10.4*  HCT 32.8*  PLT 263   Recent Labs    03/30/18 0748 03/31/18 0550  NA 139 140  K 3.4* 3.5  CL 110 110  GLUCOSE 109* 78  BUN 21 20  CREATININE 1.30* 1.36*  CALCIUM 8.6* 8.7*   CBG (last 3)  Recent Labs    03/31/18 1143 03/31/18 1322 03/31/18 1645  GLUCAP 48* 102* 82    Wt Readings from Last 3 Encounters:  04/01/18 90.9 kg (200 lb 6.4 oz)  03/22/18 95 kg (209 lb 7 oz)  10/04/17 93.4 kg (205 lb 14.6 oz)    No intake or output data in the 24 hours ending 04/01/18 0832  Vital Signs: Blood pressure (!) 153/50, pulse 65, temperature 99.4 F (37.4 C), temperature source Oral, resp. rate 20, height 5\' 7"  (1.702 m), weight 90.9 kg (200 lb 6.4 oz), SpO2 97 %. Physical Exam:  Constitutional: No distress . Vital signs reviewed. HENT: Normocephalic.  Atraumatic. Eyes: EOMI. No discharge. Cardiovascular: RRRR. No JVD. Left upper extremity fistula Respiratory: CTA Bilaterally. Normal effort. GI: BS +. Non-distended. Musc: edema in left foot (stable). Neurological: Alert and oriented Motor: bilateral upper extremities: 5/5 proximal distal, except for right wrist which is braced Right lower extremity: 4+/5 proximal to distal Left lower extremity: Hip flexion 1/5, wiggles toes with limited movement in first digit (some pain inhibition) Skin: left lower extremity with dressing C/D/I Psychiatric: normal mood, normal behavior.  Assessment/Plan: 1. Functional deficits secondary to left tib-fib fx which require 3+ hours per day of interdisciplinary therapy in a comprehensive inpatient rehab setting. Physiatrist is  providing close team supervision and 24 hour management of active medical problems listed below. Physiatrist and rehab team continue to assess barriers to discharge/monitor patient progress toward functional and medical goals.  Function:  Bathing Bathing position   Position: (seated in recliner)  Bathing parts Body parts bathed by patient: Right arm, Left arm, Chest, Abdomen, Front perineal area, Buttocks, Right upper leg, Back    Bathing assist Assist Level: Touching or steadying assistance(Pt > 75%)      Upper Body Dressing/Undressing Upper body dressing   What is the patient wearing?: Bra, Pull over shirt/dress Bra - Perfomed by patient: Thread/unthread right bra strap, Thread/unthread left bra strap, Hook/unhook bra (pull down sports bra)   Pull over shirt/dress - Perfomed by patient: Thread/unthread right sleeve, Thread/unthread left sleeve, Put head through opening, Pull shirt over trunk   Button up shirt - Perfomed by patient: Thread/unthread right sleeve, Thread/unthread left sleeve, Pull shirt around back, Button/unbutton shirt      Upper body assist Assist Level: Set up   Set up : To obtain clothing/put away  Lower Body Dressing/Undressing Lower body dressing   What is the patient wearing?: Pants     Pants- Performed by patient: Thread/unthread right pants leg, Thread/unthread left pants leg, Pull pants up/down   Non-skid slipper socks- Performed by patient: Don/doff right sock Non-skid slipper socks- Performed by helper: Don/doff left sock                  Lower body assist Assist for lower body  dressing: Set up, Assistive device Assistive Device Comment: reacher Set up : To obtain clothing/put away  Toileting Toileting   Toileting steps completed by patient: Performs perineal hygiene Toileting steps completed by helper: Adjust clothing after toileting    Toileting assist Assist level: Supervision or verbal cues   Transfers Chair/bed transfer    Chair/bed transfer method: Ambulatory Chair/bed transfer assist level: Supervision or verbal cues Chair/bed transfer assistive device: Patent attorney Ambulation activity did not occur: Safety/medical concerns   Max distance: 25 Assist level: Supervision or verbal cues   Wheelchair   Type: Manual Max wheelchair distance: 145ft  Assist Level: Supervision or verbal cues  Cognition Comprehension Comprehension assist level: Follows complex conversation/direction with no assist  Expression Expression assist level: Expresses complex ideas: With no assist  Social Interaction Social Interaction assist level: Interacts appropriately with others - No medications needed.  Problem Solving Problem solving assist level: Solves complex problems: Recognizes & self-corrects  Memory Memory assist level: Complete Independence: No helper   Medical Problem List and Plan:  1. Decreased functional mobility secondary to left tibia-fibula fracture after fall status post ORIF 03/12/2018. Non-weightbearing with hinged knee brace. Left great toe weakness and numbness ?deep peroneal nerve injury  Continue CIR plan DC tomorrow 2. DVT Prophylaxis/Anticoagulation: Subcutaneous heparin. Venous Doppler studies negative  3. Pain Management: Oxycodone as needed, right wrist sprain may ice 4. Mood: Provide emotional support  5. Neuropsych: This patient is capable of making decisions on her own behalf.  6. Skin/Wound Care: Routine skin checks    Cellulitis LLE with improvement, empiric keflex DC'd on 6/24  Discussed appropriate dressing with Ace wraps with nursing 7. Fluids/Electrolytes/Nutrition: Routine in and outs 8. Acute on chronic anemia. Continue iron supplement   Hb 10.4 on 6/25  Continue to monitor 9. ID/CMV infection. Continue Valganciclovir per infectious disease as well as Bactrim x21 days (~7/2). Follow-up transplant nephrologist as outpatient  10. Diabetes mellitus. Patient with insulin  pump. Diabetic coordinator follow-up. Hemoglobin A1c 5.8.   Pt self managing pump per Endo parameters  Hypoglycemia yesterday. Have discussed with patient insulin pump and encourage patient to follow-up with endocrinology for further management. 11. CKD stage III with history of kidney transplant 2017.   Continue Prograf and chronic prednisone   Creatinine 1.36 on 6/26 Filed Weights   03/30/18 0500 03/31/18 0500 04/01/18 0500  Weight: 90.8 kg (200 lb 2.8 oz) 91.9 kg (202 lb 9.6 oz) 90.9 kg (200 lb 6.4 oz)   Stable on 6/27  80mg  lasix daily at home, resumed 20mg  daily 6/20 , increased to 40 mg on 6/27  Potassium supplement initiated on 6/27 12. Hypertension. Norvasc 10 mg daily, Cardura 4 mg daily, hydralazine 100 mg every 8 hours, imdur 60 mg twice daily, Lopressor 50 mg twice daily.   Lasix increased to 40 mg on 6/27 Vitals:   03/31/18 1943 04/01/18 0608  BP: (!) 158/55 (!) 153/50  Pulse: 65 65  Resp: 18 20  Temp: 98.6 F (37 C) 99.4 F (37.4 C)  SpO2: 97% 97%   Elevated on 6/27 13. Hyperlipidemia. Pravachol  14. Constipation.  -continue prophylactic regimen 15.  Right wrist pain ulnar styloid tenderness -improved  Xray with? Abnormality, will consider MRI of pain persists  Continue wrist splint for sprain   LOS (Days) 10 A FACE TO FACE EVALUATION WAS PERFORMED  Taunja Brickner Karis Juba, MD 04/01/2018 8:32 AM

## 2018-04-01 NOTE — Progress Notes (Signed)
Physical Therapy Discharge Summary  Patient Details  Name: Hannah Valentine MRN: [id number] Date of Birth: 03/07/1956  Today's Date: 04/01/2018 PT Individual Time: 1600-1710 PT Individual Time Calculation (min): 70 min    Patient has met 8 of 8 long term goals due to improved activity tolerance, improved balance, increased strength, increased range of motion, decreased pain and ability to compensate for deficits.  Patient to discharge at a wheelchair level Modified Independent.   Patient's care partner requires assistance to provide the necessary physical assistance at discharge.  Reasons goals not met: All PT goals met.   Recommendation:  Patient will benefit from ongoing skilled PT services in home health setting to continue to advance safe functional mobility, address ongoing impairments in balance, gait, strength, ROM, safety, transfers, and minimize fall risk.  Equipment: WC. PFRW.   Reasons for discharge: treatment goals met and discharge from hospital  Patient/family agrees with progress made and goals achieved: Yes    PT Treatment:  Pt received sitting in WC and agreeable to PT. PT instructed pt in Grad day assessment to measure progress toward goals. See below for details. PT educated in safe access of home upon d/c by PT. This PT strongly recommends to pt to remain on main level until weight bearing restrictions have been removed by MD. Pt adamant that she will go upstair to main living area upon d/c to house. PT instructed pt in stair negotiation with bump up technique with max cues for safety sequencing, NWB, problem solving and proper UE placement to improve use of BUE to allow increased leverage up push up onto step. PT also instructed pt in problem solving for exit of home with bump down technique and how to safety return to floor from Chair and perform sit>stand from stairs without injuring LLE. Car transfer to 28" seat height with supervision assist with simulation to back seat  on driver side for protection of the LLE and improve access to car due to decreased ROM in the L knee. Patient returned to room and left sitting in [facility] with call bell in reach and all needs met.      PT Discharge Precautions/Restrictions Precautions Precautions: Fall Required Braces or Orthoses: Other Brace/Splint Other Brace/Splint: hinged brace LLE , can be unlocked when up and locked at night for sleeping.   Restrictions Weight Bearing Restrictions: Yes LLE Weight Bearing: Non weight bearing Other Position/Activity Restrictions: R wrist splint for questionable wrist sprain Pain Pain Assessment Pain Scale: 0-10 Pain Score: 0-No pain Vision/Perception  Perception Perception: Within Functional Limits Praxis Praxis: Intact  Cognition Overall Cognitive Status: Within Functional Limits for tasks assessed Arousal/Alertness: Awake/alert Orientation Level: Oriented X4 Safety/Judgment: Appears intact Sensation Sensation Light Touch: Appears Intact Proprioception: Appears Intact Coordination Gross Motor Movements are Fluid and Coordinated: No Fine Motor Movements are Fluid and Coordinated: Yes Coordination and Movement Description: Movement and coordination limited secondary to  L LE ROM deficits.  Motor  Motor Motor: Within Functional Limits Motor - Discharge Observations: Generalized weakness, limited by pain and ROM restrictions  Mobility Bed Mobility Bed Mobility: Rolling Right;Rolling Left;Supine to Sit;Sit to Supine Rolling Right: Independent with assistive device Rolling Left: Supervision/Verbal cueing Supine to Sit: Supervision/Verbal cueing Sit to Supine: Independent with assistive device Transfers Transfers: Sit to Stand;Stand Pivot Transfers Sit to Stand: Independent with assistive device Stand Pivot Transfers: Supervision/Verbal cueing Stand Pivot Transfer Details: Verbal cues for precautions/safety;Verbal cues for safe use of DME/AE Transfer (Assistive device):  Rolling walker Locomotion  Gait Ambulation: Yes Gait  Assistance: Supervision/Verbal cueing Gait Distance (Feet): 30 Feet Assistive device: Right platform walker Gait Gait: Yes Gait Pattern: Impaired Gait Pattern: Step-to pattern Stairs / Additional Locomotion Stairs: Yes Stairs Assistance: Moderate Assistance - Patient 50 - 74% Stair Management Technique: Seated/boosting Number of Stairs: 4 Height of Stairs: 6 Wheelchair Mobility Wheelchair Mobility: Yes Wheelchair Assistance: Independent with Camera operator: Both upper extremities Wheelchair Parts Management: Independent Distance: 252f   Trunk/Postural Assessment  Cervical Assessment Cervical Assessment: Within Functional Limits Thoracic Assessment Thoracic Assessment: Within Functional Limits Lumbar Assessment Lumbar Assessment: Within Functional Limits Postural Control Postural Control: Within Functional Limits  Balance Balance Balance Assessed: Yes Dynamic Sitting Balance Dynamic Sitting - Balance Support: During functional activity Dynamic Sitting - Level of Assistance: 7: Independent Static Standing Balance Static Standing - Balance Support: During functional activity Static Standing - Level of Assistance: 5: Stand by assistance Dynamic Standing Balance Dynamic Standing - Balance Support: During functional activity;Right upper extremity supported;Left upper extremity supported Dynamic Standing - Level of Assistance: 5: Stand by assistance Extremity Assessment      RLE Assessment RLE Assessment: Within Functional Limits LLE Assessment LLE Assessment: Exceptions to [facility] Motion (PROM) Comments: knee flexion to ~75 degrees, knee extension to 0 degrees, ankle DF lacking 12 degrees from neutral  Active Range of Motion (AROM) Comments: knee flexion to 70 degrees LLE Strength Left Hip Flexion: 3+/5 Left Hip Extension: 3+/5 Left Knee Flexion: 3/5 Left Knee Extension:  4-/5 Left Ankle Dorsiflexion: 1/5 Left Ankle Plantar Flexion: 2-/5   See Function Navigator for Current Functional Status.  ALorie Phenix6/27/2019, 5:39 PM

## 2018-04-02 LAB — GLUCOSE, CAPILLARY
GLUCOSE-CAPILLARY: 84 mg/dL (ref 70–99)
Glucose-Capillary: 46 mg/dL — ABNORMAL LOW (ref 70–99)

## 2018-04-02 MED ORDER — GLUCOSE 4 G PO CHEW
3.0000 | CHEWABLE_TABLET | Freq: Once | ORAL | Status: AC
  Administered 2018-04-02: 12 g via ORAL

## 2018-04-02 NOTE — Discharge Instructions (Signed)
Inpatient Rehab Discharge Instructions  Hannah Valentine Discharge date and time: No discharge date for patient encounter.   Activities/Precautions/ Functional Status: Activity: Nonweightbearing left lower extremity with hinged knee brace that should be on when out of bed Diet: Diabetic diet Wound Care: keep wound clean and dry Functional status:  ___ No restrictions     ___ Walk up steps independently ___ 24/7 supervision/assistance   ___ Walk up steps with assistance ___ Intermittent supervision/assistance  ___ Bathe/dress independently ___ Walk with walker     _x__ Bathe/dress with assistance ___ Walk Independently    ___ Shower independently ___ Walk with assistance    ___ Shower with assistance ___ No alcohol     ___ Return to work/school ________   COMMUNITY REFERRALS UPON DISCHARGE:    Home Health:   PT     OT                      Agency:  [facility] Phone: [phone]   Medical Equipment/Items Ordered:  Wheelchair, cushion, walker, commode and tub seat                                                      Agency/Supplier: [facility] @ [phone]     Special Instructions: Follow-up renal transplant team in regards to recent CMV infection  No driving   My questions have been answered and I understand these instructions. I will adhere to these goals and the provided educational materials after my discharge from the hospital.  Patient/Caregiver Signature _______________________________ Date __________  Clinician Signature _______________________________________ Date __________  Please bring this form and your medication list with you to all your follow-up doctor's appointments.

## 2018-04-02 NOTE — Progress Notes (Signed)
[place] PHYSICAL MEDICINE & REHABILITATION     PROGRESS NOTE   Subjective/Complaints: Pt seen sitting up in her chair this Am.  She slept well overnight and is ready for discharge.   ROS: Denies CP, SOB, nausea, vomiting, diarrhea.  Objective:  No results found. No results for input(s): WBC, HGB, HCT, PLT in the last 72 hours. Recent Labs    03/31/18 0550  NA 140  K 3.5  CL 110  GLUCOSE 78  BUN 20  CREATININE 1.36*  CALCIUM 8.7*   CBG (last 3)  Recent Labs    04/01/18 2139 04/02/18 0619 04/02/18 0715  GLUCAP 100* 46* 84    Wt Readings from Last 3 Encounters:  04/02/18 93 kg (205 lb 0.4 oz)  03/22/18 95 kg (209 lb 7 oz)  10/04/17 93.4 kg (205 lb 14.6 oz)     Intake/Output Summary (Last 24 hours) at 04/02/2018 0838 Last data filed at 04/01/2018 1700 Gross per 24 hour  Intake 480 ml  Output -  Net 480 ml    Vital Signs: Blood pressure (!) 139/50, pulse 66, temperature 98.6 F (37 C), temperature source Oral, resp. rate 18, height 5\' 7"  (1.702 m), weight 93 kg (205 lb 0.4 oz), SpO2 97 %. Physical Exam:  Constitutional: No distress . Vital signs reviewed. HENT: Normocephalic.  Atraumatic. Eyes: EOMI. No discharge. Cardiovascular: RRR. No JVD. Left upper extremity fistula Respiratory: CTA Bilaterally. Normal effort. GI: BS +. Non-distended. Musc: edema in left foot (stable). Neurological: Alert and oriented Motor: bilateral upper extremities: 5/5 proximal distal, except for right wrist which is braced Right lower extremity: 4+/5 proximal to distal Left lower extremity: Hip flexion 3/5, wiggles toes with limited movement in first digit (some pain inhibition) Skin: left lower extremity with dressing C/D/I Psychiatric: normal mood, normal behavior.  Assessment/Plan: 1. Functional deficits secondary to left tib-fib fx which require 3+ hours per day of interdisciplinary therapy in a comprehensive inpatient rehab setting. Physiatrist is providing close team  supervision and 24 hour management of active medical problems listed below. Physiatrist and rehab team continue to assess barriers to discharge/monitor patient progress toward functional and medical goals.  Function:  Bathing Bathing position   Position: Shower  Bathing parts Body parts bathed by patient: Right arm, Left arm, Chest, Abdomen, Front perineal area, Buttocks, Right upper leg, Back, Left upper leg, Right lower leg, Left lower leg    Bathing assist Assist Level: Supervision or verbal cues      Upper Body Dressing/Undressing Upper body dressing   What is the patient wearing?: Bra, Pull over shirt/dress Bra - Perfomed by patient: (pt wanted left on)   Pull over shirt/dress - Perfomed by patient: Thread/unthread right sleeve, Thread/unthread left sleeve, Put head through opening, Pull shirt over trunk   Button up shirt - Perfomed by patient: Thread/unthread right sleeve, Thread/unthread left sleeve, Pull shirt around back, Button/unbutton shirt      Upper body assist Assist Level: Set up   Set up : To obtain clothing/put away  Lower Body Dressing/Undressing Lower body dressing   What is the patient wearing?: Non-skid slipper socks     Pants- Performed by patient: Thread/unthread right pants leg, Pull pants up/down   Non-skid slipper socks- Performed by patient: Don/doff right sock Non-skid slipper socks- Performed by helper: Don/doff right sock Socks - Performed by patient: Don/doff left sock   Shoes - Performed by patient: Don/doff left shoe, Fasten left  Lower body assist Assist for lower body dressing: Supervision or verbal cues Assistive Device Comment: reacher Set up : To obtain clothing/put away  Toileting Toileting   Toileting steps completed by patient: Adjust clothing prior to toileting, Adjust clothing after toileting, Performs perineal hygiene Toileting steps completed by helper: Adjust clothing after toileting Toileting Assistive  Devices: Grab bar or rail  Toileting assist Assist level: Supervision or verbal cues   Transfers Chair/bed transfer   Chair/bed transfer method: Stand pivot Chair/bed transfer assist level: Supervision or verbal cues Chair/bed transfer assistive device: Armrests, Patent attorney Ambulation activity did not occur: Safety/medical concerns   Max distance: 60ft Assist level: Supervision or verbal cues   Wheelchair   Type: Manual Max wheelchair distance: 27ft Assist Level: No help, No cues, assistive device, takes more than reasonable amount of time  Cognition Comprehension Comprehension assist level: Follows complex conversation/direction with no assist  Expression Expression assist level: Expresses complex ideas: With no assist  Social Interaction Social Interaction assist level: Interacts appropriately with others - No medications needed.  Problem Solving Problem solving assist level: Solves complex problems: Recognizes & self-corrects  Memory Memory assist level: Complete Independence: No helper   Medical Problem List and Plan:  1. Decreased functional mobility secondary to left tibia-fibula fracture after fall status post ORIF 03/12/2018. Non-weightbearing with hinged knee brace. Left great toe weakness and numbness ?deep peroneal nerve injury  D/c today  Will see patient for transitional care management in 1-2 weeks 2. DVT Prophylaxis/Anticoagulation: Subcutaneous heparin. Venous Doppler studies negative  3. Pain Management: Oxycodone as needed, right wrist sprain may ice 4. Mood: Provide emotional support  5. Neuropsych: This patient is capable of making decisions on her own behalf.  6. Skin/Wound Care: Routine skin checks    Cellulitis LLE with improvement, empiric keflex DC'd on 6/24  Discussed appropriate dressing with Ace wraps with nursing 7. Fluids/Electrolytes/Nutrition: Routine in and outs 8. Acute on chronic anemia. Continue iron supplement   Hb 10.4  on 6/25  Continue to monitor 9. ID/CMV infection. Continue Valganciclovir per infectious disease as well as Bactrim x21 days (~7/2). Follow-up transplant nephrologist as outpatient  10. Diabetes mellitus. Patient with insulin pump. Diabetic coordinator follow-up. Hemoglobin A1c 5.8.   Pt self managing pump per Endo parameters  Hypoglycemia yesterday again. Have discussed with patient insulin pump and encourage patient to follow-up with endocrinology for further management, pt aware and states she is asymptomatic. 11. CKD stage III with history of kidney transplant 2017.   Continue Prograf and chronic prednisone   Creatinine 1.36 on 6/26 Filed Weights   03/31/18 0500 04/01/18 0500 04/02/18 0500  Weight: 91.9 kg (202 lb 9.6 oz) 90.9 kg (200 lb 6.4 oz) 93 kg (205 lb 0.4 oz)   ?Reliability  80mg  lasix daily at home, resumed 20mg  daily 6/20 , increased to 40 mg on 6/27  Potassium supplement initiated on 6/27 12. Hypertension. Norvasc 10 mg daily, Cardura 4 mg daily, hydralazine 100 mg every 8 hours, imdur 60 mg twice daily, Lopressor 50 mg twice daily.   Lasix increased to 40 mg on 6/27 Vitals:   04/02/18 0615 04/02/18 0759  BP: (!) 154/55 (!) 139/50  Pulse: 61 66  Resp: 18   Temp: 98.6 F (37 C)   SpO2: 97%    Slightly labile, will need further adjustments as outpatient 13. Hyperlipidemia. Pravachol  14. Constipation.  -continue prophylactic regimen 15.  Right wrist pain ulnar styloid tenderness -improved  Xray with?  Abnormality, will consider MRI of pain persists  Continue wrist splint for sprain   LOS (Days) 11 A FACE TO FACE EVALUATION WAS PERFORMED  Riannon Mukherjee Karis Juba, MD 04/02/2018 8:38 AM

## 2018-04-02 NOTE — Plan of Care (Signed)
  Problem: RH BOWEL ELIMINATION Goal: RH STG MANAGE BOWEL WITH ASSISTANCE Description STG Manage Bowel with Min Assistance.  Outcome: Completed/Met Goal: RH STG MANAGE BOWEL W/MEDICATION W/ASSISTANCE Description STG Manage Bowel with Medication with [place].  Outcome: Completed/Met   Problem: RH BLADDER ELIMINATION Goal: RH STG MANAGE BLADDER WITH ASSISTANCE Description STG Manage Bladder With Min Assistance  Outcome: Completed/Met   Problem: RH SKIN INTEGRITY Goal: RH STG SKIN FREE OF INFECTION/BREAKDOWN Description Skin free of breakdown with min assist  Outcome: Completed/Met Goal: RH STG MAINTAIN SKIN INTEGRITY WITH ASSISTANCE Description STG Maintain Skin Integrity With [place].  Outcome: Completed/Met Goal: RH STG ABLE TO PERFORM INCISION/WOUND CARE W/ASSISTANCE Description STG Able To Perform Incision/Wound Care With [employer].  Outcome: Completed/Met   Problem: RH SAFETY Goal: RH STG ADHERE TO SAFETY PRECAUTIONS W/ASSISTANCE/DEVICE Description STG Adhere to Safety Precautions With Min Assistance/Device.  Outcome: Completed/Met Goal: RH STG DEMO UNDERSTANDING HOME SAFETY PRECAUTIONS Description Patient can verbalize home safety precautions with min assist  Outcome: Completed/Met   Problem: RH PAIN MANAGEMENT Goal: RH STG PAIN MANAGED AT OR BELOW PT'S PAIN GOAL Description Less than 3  Outcome: Completed/Met   Problem: RH KNOWLEDGE DEFICIT GENERAL Goal: RH STG INCREASE KNOWLEDGE OF SELF CARE AFTER HOSPITALIZATION Description Patient will be able to verbalize understanding of care of leg and prevention of complications with min assist.  Outcome: Completed/Met

## 2018-04-02 NOTE — Progress Notes (Signed)
Patient being d/c home with husband. No c/o pain at this time. Belonging took home yesterday and patient taken out by staff.

## 2018-04-02 NOTE — Progress Notes (Signed)
Social Work  Discharge Note  The overall goal for the admission was met for:   Discharge location: Yes - home with spouse who can provide any needed assistance.  Length of Stay: Yes - 11 days  Discharge activity level: Yes - mod independent to supervision  Home/community participation: Yes  Services provided included: MD, RD, PT, OT, RN, TR, Pharmacy and SW  Financial Services: Medicare and Private Insurance: [place]  Follow-up services arranged: Home Health: PT, OT via [facility], DME: 20x18 lightweight w/c with ELRs, cushion, rolling walker with right platform attachment, 3n1 commode and tub seat via [place] and Patient/Family has no preference for HH/DME agencies  Comments (or additional information):  Patient/Family verbalized understanding of follow-up arrangements: Yes  Individual responsible for coordination of the follow-up plan: pt  Confirmed correct DME delivered: Lewayne Pauley 04/02/2018    Keirstan Iannello

## 2018-05-06 ENCOUNTER — Encounter: Payer: Medicare Other | Admitting: Physical Medicine & Rehabilitation

## 2020-05-18 IMAGING — RF DG TIBIA/FIBULA 2V*L*
1 series · 5 of 5 positions shown · non-contrast
Comparison: CT left knee 03/11/2018.

FLUOROSCOPY TIME:  1 minutes 50 seconds

CLINICAL DATA: 61-year-old female with comminuted and displaced
extra-articular fracture of the proximal left tibia and
intra-articular fracture of the proximal left fibula undergoing
ORIF.

EXAM:
DG C-ARM 61-120 MIN; LEFT TIBIA AND FIBULA - 2 VIEW

[Series 1: run · 5 of 5 slices shown]
[im 1/5]
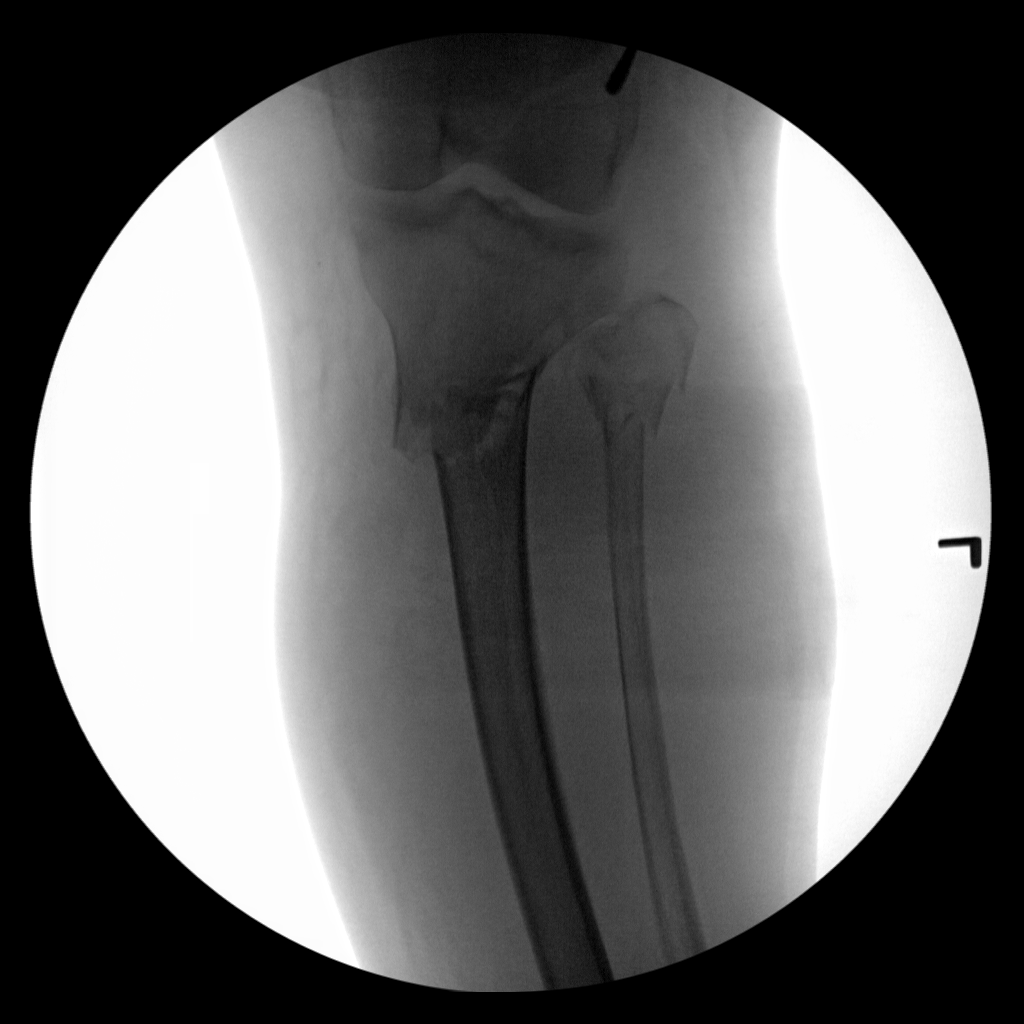
[im 2/5]
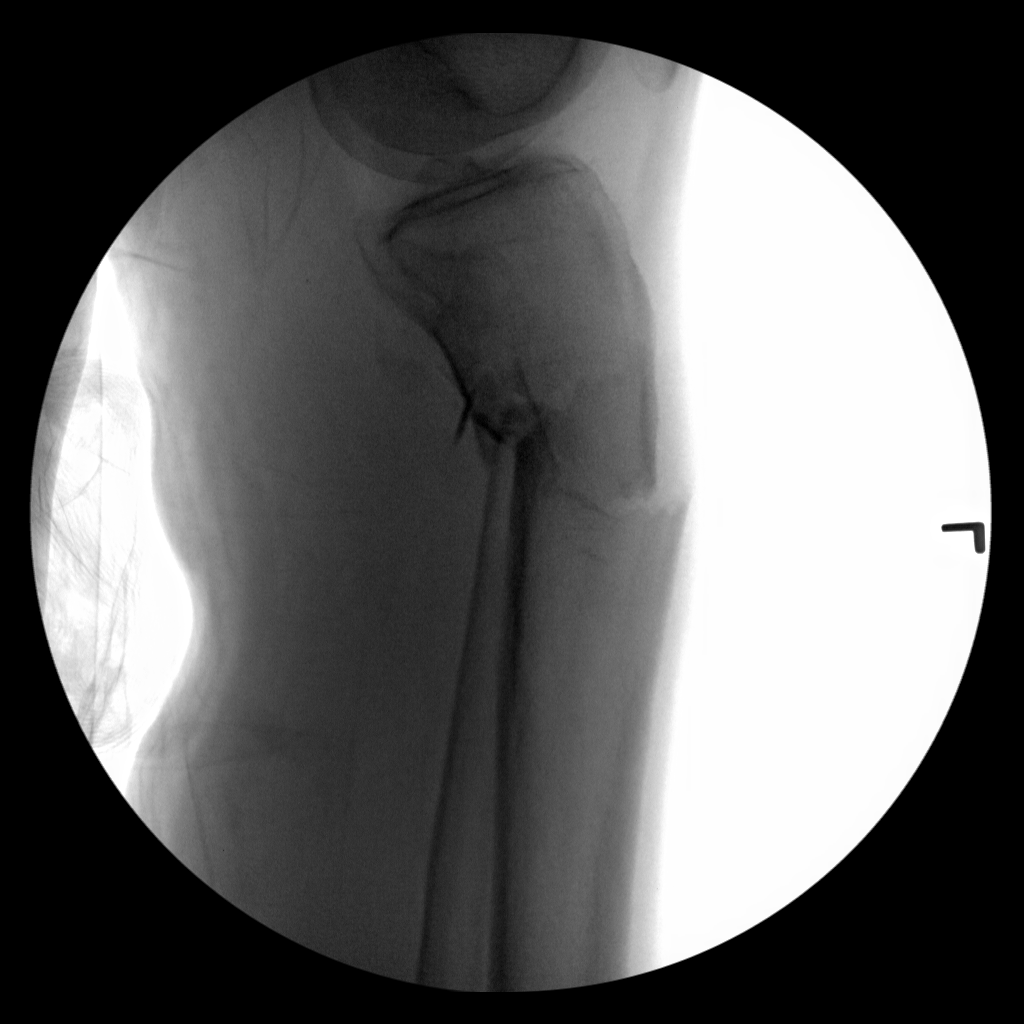
[im 3/5]
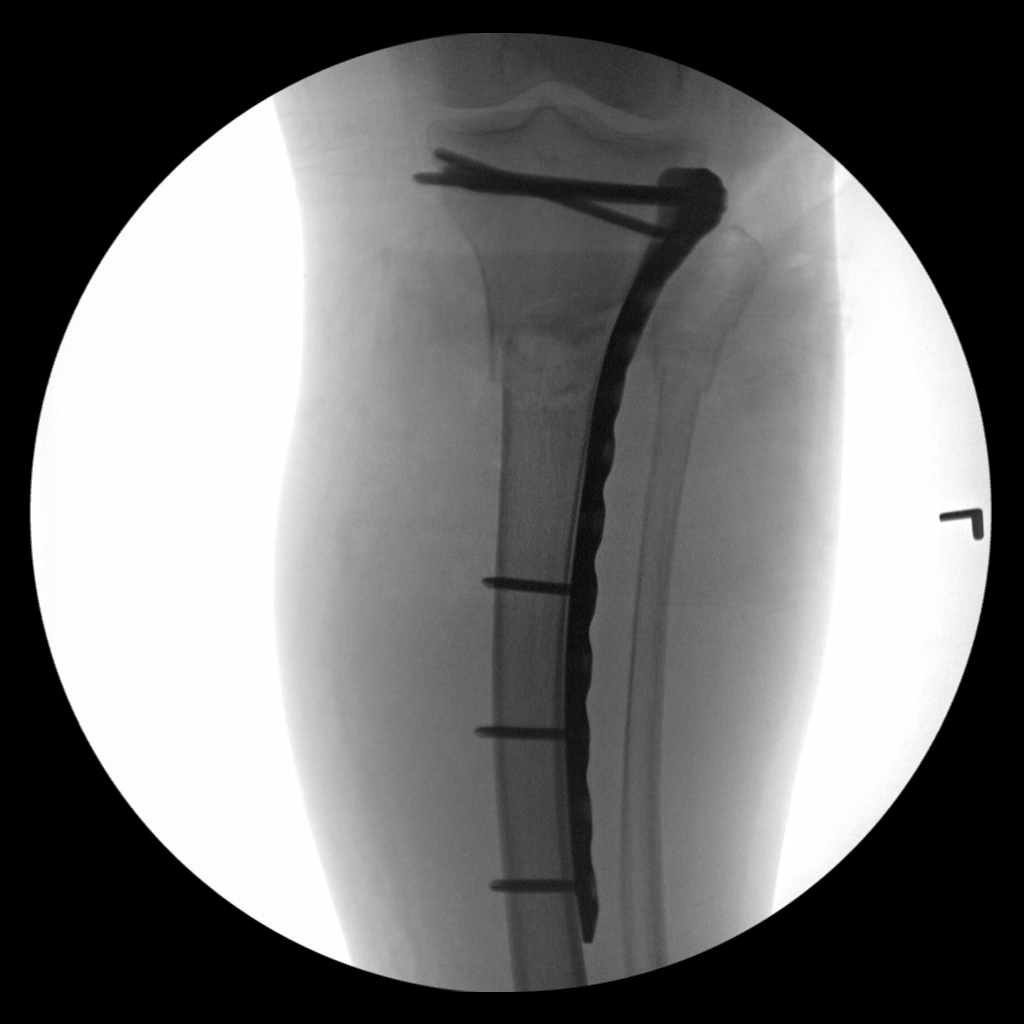
[im 4/5]
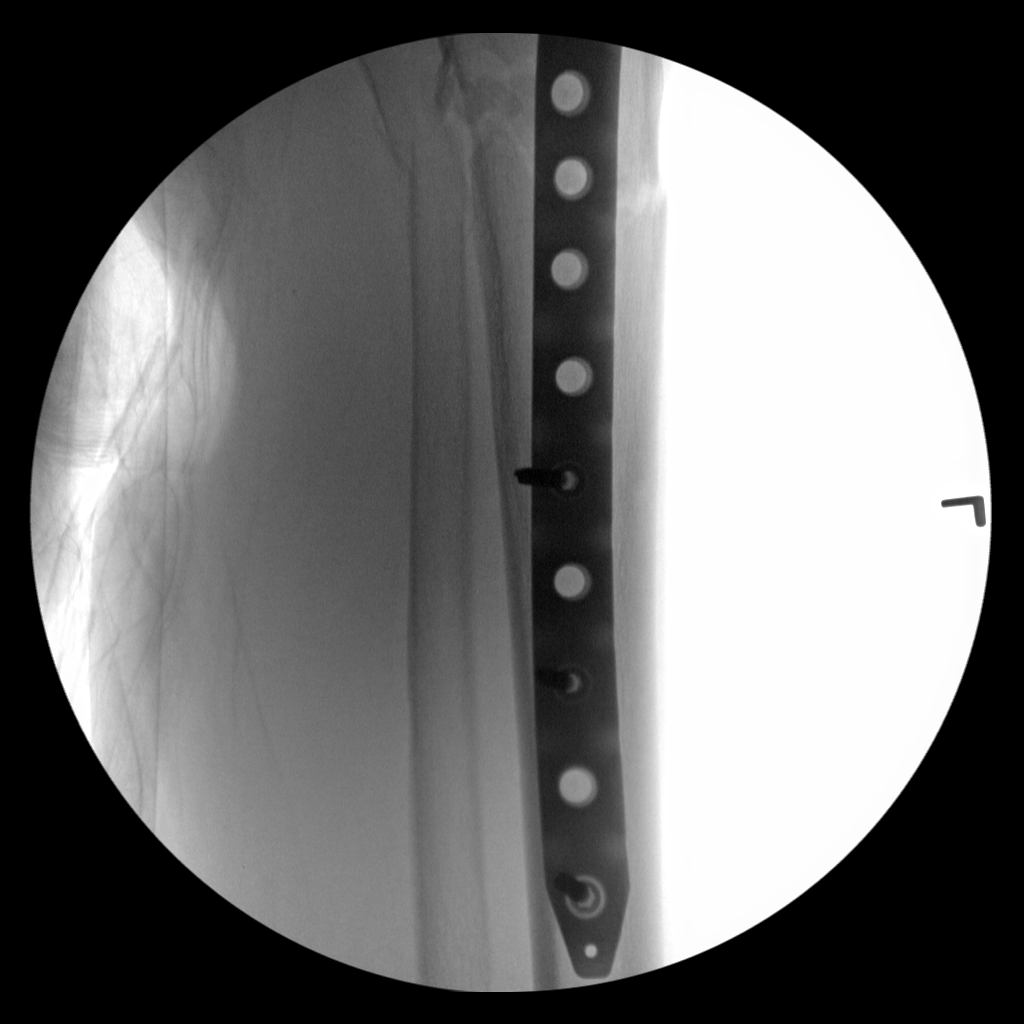
[im 5/5]
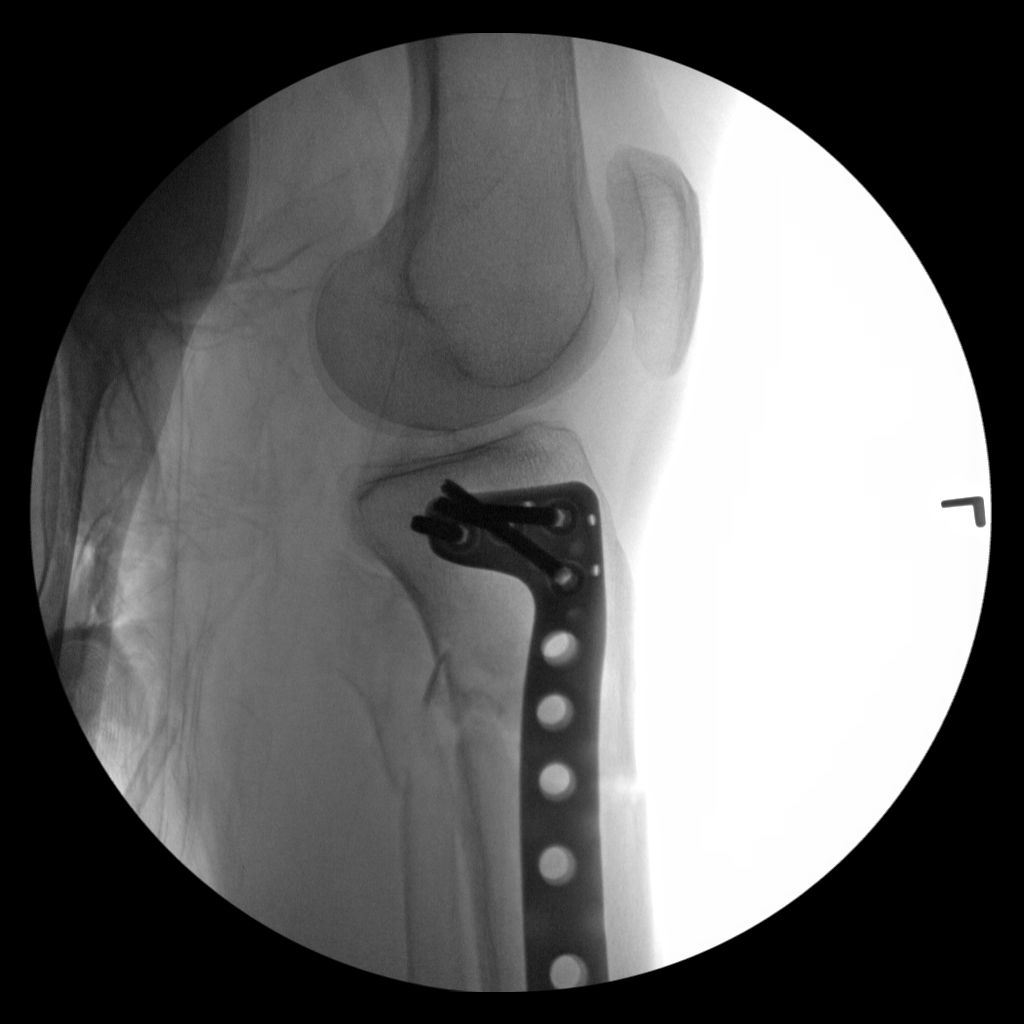

[5 of 5 positions shown; findings below may reference images not displayed]

FINDINGS: Five intraoperative fluoroscopic spot views of the proximal left
tibia and fibula in the AP and lateral projection. On the final 3
images lateral plate and screw fixation of the proximal left tibia
is demonstrated with improved alignment of fracture fragments.
Hardware appears intact. The proximal left fibula fracture also
appears improved on those images.
IMPRESSION: ORIF proximal left tibia with no adverse features.
